# Patient Record
Sex: Male | Born: 1950
Health system: Southern US, Community
[De-identification: ages and names within clinical notes are randomized; demographics above are authoritative.]

## PROBLEM LIST (undated history)

## (undated) DIAGNOSIS — F40240 Claustrophobia: Secondary | ICD-10-CM

## (undated) DIAGNOSIS — M1990 Unspecified osteoarthritis, unspecified site: Secondary | ICD-10-CM

## (undated) DIAGNOSIS — E119 Type 2 diabetes mellitus without complications: Secondary | ICD-10-CM

## (undated) DIAGNOSIS — J189 Pneumonia, unspecified organism: Secondary | ICD-10-CM

## (undated) DIAGNOSIS — G4730 Sleep apnea, unspecified: Secondary | ICD-10-CM

## (undated) DIAGNOSIS — I10 Essential (primary) hypertension: Secondary | ICD-10-CM

## (undated) DIAGNOSIS — K219 Gastro-esophageal reflux disease without esophagitis: Secondary | ICD-10-CM

## (undated) DIAGNOSIS — E785 Hyperlipidemia, unspecified: Secondary | ICD-10-CM

## (undated) DIAGNOSIS — N189 Chronic kidney disease, unspecified: Secondary | ICD-10-CM

## (undated) DIAGNOSIS — C801 Malignant (primary) neoplasm, unspecified: Secondary | ICD-10-CM

## (undated) DIAGNOSIS — D649 Anemia, unspecified: Secondary | ICD-10-CM

## (undated) DIAGNOSIS — J45909 Unspecified asthma, uncomplicated: Secondary | ICD-10-CM

## (undated) HISTORY — PX: NASAL SINUS SURGERY: SHX719

## (undated) HISTORY — DX: Type 2 diabetes mellitus without complications: E11.9

## (undated) HISTORY — PX: EYE SURGERY: SHX253

## (undated) HISTORY — PX: CARDIAC CATHETERIZATION: SHX172

## (undated) HISTORY — DX: Essential (primary) hypertension: I10

## (undated) HISTORY — PX: SKIN LESION EXCISION: SHX2412

## (undated) HISTORY — PX: CERVICAL VERTEBRAE EXCISION: SUR502

## (undated) HISTORY — PX: OTHER SURGICAL HISTORY: SHX169

## (undated) HISTORY — PX: TRIGGER FINGER RELEASE: SHX641

## (undated) HISTORY — PX: CHOLECYSTECTOMY: SHX55

## (undated) HISTORY — DX: Hyperlipidemia, unspecified: E78.5

---

## 2007-12-29 DIAGNOSIS — C4492 Squamous cell carcinoma of skin, unspecified: Secondary | ICD-10-CM

## 2007-12-29 HISTORY — DX: Squamous cell carcinoma of skin, unspecified: C44.92

## 2014-03-28 LAB — HEMOGLOBIN A1C: HEMOGLOBIN A1C: 10.2

## 2015-01-25 DIAGNOSIS — I10 Essential (primary) hypertension: Secondary | ICD-10-CM | POA: Diagnosis not present

## 2015-01-25 DIAGNOSIS — E785 Hyperlipidemia, unspecified: Secondary | ICD-10-CM | POA: Diagnosis not present

## 2015-01-25 DIAGNOSIS — E1165 Type 2 diabetes mellitus with hyperglycemia: Secondary | ICD-10-CM | POA: Diagnosis not present

## 2015-01-25 DIAGNOSIS — J4540 Moderate persistent asthma, uncomplicated: Secondary | ICD-10-CM | POA: Diagnosis not present

## 2015-01-25 DIAGNOSIS — F410 Panic disorder [episodic paroxysmal anxiety] without agoraphobia: Secondary | ICD-10-CM | POA: Diagnosis not present

## 2015-01-25 DIAGNOSIS — Z794 Long term (current) use of insulin: Secondary | ICD-10-CM | POA: Diagnosis not present

## 2015-01-25 DIAGNOSIS — N183 Chronic kidney disease, stage 3 (moderate): Secondary | ICD-10-CM | POA: Diagnosis not present

## 2015-01-25 DIAGNOSIS — E1121 Type 2 diabetes mellitus with diabetic nephropathy: Secondary | ICD-10-CM | POA: Diagnosis not present

## 2015-01-25 DIAGNOSIS — Z889 Allergy status to unspecified drugs, medicaments and biological substances status: Secondary | ICD-10-CM | POA: Diagnosis not present

## 2015-02-07 DIAGNOSIS — J309 Allergic rhinitis, unspecified: Secondary | ICD-10-CM | POA: Diagnosis not present

## 2015-02-07 DIAGNOSIS — K219 Gastro-esophageal reflux disease without esophagitis: Secondary | ICD-10-CM | POA: Diagnosis not present

## 2015-02-07 DIAGNOSIS — J45909 Unspecified asthma, uncomplicated: Secondary | ICD-10-CM | POA: Diagnosis not present

## 2015-02-07 DIAGNOSIS — R05 Cough: Secondary | ICD-10-CM | POA: Diagnosis not present

## 2015-02-08 DIAGNOSIS — R0602 Shortness of breath: Secondary | ICD-10-CM | POA: Diagnosis not present

## 2015-02-22 DIAGNOSIS — I10 Essential (primary) hypertension: Secondary | ICD-10-CM | POA: Diagnosis not present

## 2015-02-22 DIAGNOSIS — I83819 Varicose veins of unspecified lower extremities with pain: Secondary | ICD-10-CM | POA: Diagnosis not present

## 2015-02-28 DIAGNOSIS — J4540 Moderate persistent asthma, uncomplicated: Secondary | ICD-10-CM | POA: Diagnosis not present

## 2015-02-28 DIAGNOSIS — K219 Gastro-esophageal reflux disease without esophagitis: Secondary | ICD-10-CM | POA: Diagnosis not present

## 2015-02-28 DIAGNOSIS — R0902 Hypoxemia: Secondary | ICD-10-CM | POA: Diagnosis not present

## 2015-02-28 DIAGNOSIS — R05 Cough: Secondary | ICD-10-CM | POA: Diagnosis not present

## 2015-02-28 DIAGNOSIS — G4733 Obstructive sleep apnea (adult) (pediatric): Secondary | ICD-10-CM | POA: Diagnosis not present

## 2015-02-28 DIAGNOSIS — J3089 Other allergic rhinitis: Secondary | ICD-10-CM | POA: Diagnosis not present

## 2015-03-21 DIAGNOSIS — R05 Cough: Secondary | ICD-10-CM | POA: Diagnosis not present

## 2015-03-21 DIAGNOSIS — J4540 Moderate persistent asthma, uncomplicated: Secondary | ICD-10-CM | POA: Diagnosis not present

## 2015-03-21 DIAGNOSIS — H9212 Otorrhea, left ear: Secondary | ICD-10-CM | POA: Diagnosis not present

## 2015-03-21 DIAGNOSIS — H7292 Unspecified perforation of tympanic membrane, left ear: Secondary | ICD-10-CM | POA: Diagnosis not present

## 2015-03-21 DIAGNOSIS — H6692 Otitis media, unspecified, left ear: Secondary | ICD-10-CM | POA: Diagnosis not present

## 2015-03-29 DIAGNOSIS — M65342 Trigger finger, left ring finger: Secondary | ICD-10-CM | POA: Diagnosis not present

## 2015-03-31 ENCOUNTER — Ambulatory Visit: Payer: Self-pay | Admitting: "Endocrinology

## 2015-04-04 DIAGNOSIS — J4540 Moderate persistent asthma, uncomplicated: Secondary | ICD-10-CM | POA: Diagnosis not present

## 2015-04-04 DIAGNOSIS — R05 Cough: Secondary | ICD-10-CM | POA: Diagnosis not present

## 2015-04-08 ENCOUNTER — Ambulatory Visit: Payer: Self-pay | Admitting: "Endocrinology

## 2015-04-08 DIAGNOSIS — H40023 Open angle with borderline findings, high risk, bilateral: Secondary | ICD-10-CM | POA: Diagnosis not present

## 2015-04-19 DIAGNOSIS — J4540 Moderate persistent asthma, uncomplicated: Secondary | ICD-10-CM | POA: Diagnosis not present

## 2015-04-19 DIAGNOSIS — R05 Cough: Secondary | ICD-10-CM | POA: Diagnosis not present

## 2015-04-21 DIAGNOSIS — G2581 Restless legs syndrome: Secondary | ICD-10-CM | POA: Diagnosis not present

## 2015-04-21 DIAGNOSIS — I83899 Varicose veins of unspecified lower extremities with other complications: Secondary | ICD-10-CM | POA: Diagnosis not present

## 2015-04-21 DIAGNOSIS — I872 Venous insufficiency (chronic) (peripheral): Secondary | ICD-10-CM | POA: Diagnosis not present

## 2015-04-22 ENCOUNTER — Other Ambulatory Visit: Payer: Self-pay | Admitting: "Endocrinology

## 2015-04-22 ENCOUNTER — Ambulatory Visit (INDEPENDENT_AMBULATORY_CARE_PROVIDER_SITE_OTHER): Payer: Medicare Other | Admitting: "Endocrinology

## 2015-04-22 ENCOUNTER — Encounter: Payer: Self-pay | Admitting: "Endocrinology

## 2015-04-22 ENCOUNTER — Encounter: Payer: Medicare Other | Attending: "Endocrinology | Admitting: Nutrition

## 2015-04-22 VITALS — Ht 71.0 in | Wt 256.0 lb

## 2015-04-22 VITALS — BP 137/82 | HR 80 | Ht 71.0 in | Wt 256.0 lb

## 2015-04-22 DIAGNOSIS — E669 Obesity, unspecified: Secondary | ICD-10-CM | POA: Insufficient documentation

## 2015-04-22 DIAGNOSIS — E119 Type 2 diabetes mellitus without complications: Secondary | ICD-10-CM | POA: Insufficient documentation

## 2015-04-22 DIAGNOSIS — E785 Hyperlipidemia, unspecified: Secondary | ICD-10-CM | POA: Diagnosis not present

## 2015-04-22 DIAGNOSIS — E1165 Type 2 diabetes mellitus with hyperglycemia: Secondary | ICD-10-CM | POA: Diagnosis not present

## 2015-04-22 DIAGNOSIS — E1122 Type 2 diabetes mellitus with diabetic chronic kidney disease: Secondary | ICD-10-CM | POA: Diagnosis not present

## 2015-04-22 DIAGNOSIS — Z794 Long term (current) use of insulin: Secondary | ICD-10-CM | POA: Insufficient documentation

## 2015-04-22 DIAGNOSIS — E118 Type 2 diabetes mellitus with unspecified complications: Secondary | ICD-10-CM

## 2015-04-22 DIAGNOSIS — IMO0002 Reserved for concepts with insufficient information to code with codable children: Secondary | ICD-10-CM

## 2015-04-22 DIAGNOSIS — N183 Chronic kidney disease, stage 3 (moderate): Secondary | ICD-10-CM | POA: Insufficient documentation

## 2015-04-22 DIAGNOSIS — I10 Essential (primary) hypertension: Secondary | ICD-10-CM

## 2015-04-22 DIAGNOSIS — E782 Mixed hyperlipidemia: Secondary | ICD-10-CM | POA: Insufficient documentation

## 2015-04-22 MED ORDER — SITAGLIPTIN PHOSPHATE 25 MG PO TABS: 25.0000 mg | ORAL_TABLET | Freq: Every day | ORAL | Status: DC

## 2015-04-22 NOTE — Patient Instructions (Signed)
Goals: 1. Follow My Plate Method 2. Eat 3-4 carb choices per meal 3. Do not skip meals 4. Eat meals on time 5. Take insulin as prescribed. Use sliding scale as instructed. 6. Cut out snacks between meals. 7. Drink only water 8. Walk for exercise 30 minutes at least daily for needed weight loss 9. Get A1C down to 8-9% in three months

## 2015-04-22 NOTE — Progress Notes (Signed)
Subjective:    Patient ID: Allen [name], male    DOB: 1950/05/01. Patient is being seen in consultation for management of diabetes requested by  [facility], MD  Past Medical History  Diagnosis Date  . Diabetes mellitus, type II ([place])   . Hypertension   . Hyperlipidemia   . Kidney disease    Past Surgical History  Procedure Laterality Date  . Nasal sinus surgery    . Cholecystectomy    . Carpal tunnel     Social History   Social History  . Marital Status: Married    Spouse Name: N/A  . Number of Children: N/A  . Years of Education: N/A   Social History Main Topics  . Smoking status: Never Smoker   . Smokeless tobacco: None  . Alcohol Use: No  . Drug Use: No  . Sexual Activity: Not Asked   Other Topics Concern  . None   Social History Narrative  . None   Outpatient Encounter Prescriptions as of 04/22/2015  Medication Sig  . amLODipine (NORVASC) 10 MG tablet Take 10 mg by mouth daily.  Marland Kitchen atorvastatin (LIPITOR) 20 MG tablet Take 20 mg by mouth daily.  Marland Kitchen eplerenone (INSPRA) 50 MG tablet Take 50 mg by mouth daily.  . fenofibrate (TRICOR) 145 MG tablet Take 145 mg by mouth daily.  . Fluticasone-Salmeterol (ADVAIR DISKUS) 500-50 MCG/DOSE AEPB Inhale 1 puff into the lungs daily.  Marland Kitchen gabapentin (NEURONTIN) 300 MG capsule Take 300 mg by mouth at bedtime.  . insulin aspart (NOVOLOG FLEXPEN) 100 UNIT/ML FlexPen Inject 10-16 Units into the skin 3 (three) times daily with meals.  . Insulin Glargine (LANTUS SOLOSTAR) 100 UNIT/ML Solostar Pen Inject 50 Units into the skin daily at 10 pm.  . losartan (COZAAR) 100 MG tablet Take 100 mg by mouth daily.  . metoprolol (LOPRESSOR) 50 MG tablet Take 50 mg by mouth every morning.  . mometasone (NASONEX) 50 MCG/ACT nasal spray Place 2 sprays into the nose daily.  . montelukast (SINGULAIR) 10 MG tablet Take 10 mg by mouth at bedtime.  . Omega-3 Fatty Acids (FISH OIL) 1000 MG CAPS Take by mouth 2 (two) times daily.  . sitaGLIPtin  (JANUVIA) 25 MG tablet Take 1 tablet (25 mg total) by mouth daily.   No facility-administered encounter medications on file as of 04/22/2015.   ALLERGIES: No Known Allergies VACCINATION STATUS:  There is no immunization history on file for this patient.  Diabetes He presents for his initial diabetic visit. He has type 2 diabetes mellitus. Onset time: He was diagnosed at approximate age of 22 years. His disease course has been worsening. There are no hypoglycemic associated symptoms. Pertinent negatives for hypoglycemia include no confusion, headaches, pallor or seizures. Associated symptoms include polydipsia, polyphagia and polyuria. Pertinent negatives for diabetes include no chest pain, no fatigue and no weakness. There are no hypoglycemic complications. Symptoms are worsening. Diabetic complications include nephropathy. Risk factors for coronary artery disease include diabetes mellitus, dyslipidemia, hypertension, male sex, sedentary lifestyle and obesity. Current diabetic treatment includes insulin injections (He is on Lantus 78 units nightly and NovoLog 30-40 units 3 times a day before meals.). His weight is decreasing steadily. He is following a generally unhealthy diet. When asked about meal planning, he reported none. He has not had a previous visit with a dietitian (He will see the dietitian today.). He participates in exercise intermittently (He participates in golfing activities.). Home blood sugar record trend: He did not bring any meter nor blood  glucose results to review today. An ACE inhibitor/angiotensin II receptor blocker is being taken. Eye exam is current.  Hyperlipidemia This is a chronic problem. The current episode started more than 1 year ago. The problem is uncontrolled. Exacerbating diseases include chronic renal disease, diabetes and obesity. Pertinent negatives include no chest pain, myalgias or shortness of breath. Current antihyperlipidemic treatment includes statins and  bile acid squestrants. Compliance problems include adherence to diet.  Risk factors for coronary artery disease include diabetes mellitus, dyslipidemia, hypertension, male sex, obesity and a sedentary lifestyle.  Hypertension This is a chronic problem. The current episode started more than 1 year ago. The problem is uncontrolled. Pertinent negatives include no chest pain, headaches, neck pain, palpitations or shortness of breath. Risk factors for coronary artery disease include diabetes mellitus. Past treatments include angiotensin blockers. Hypertensive end-organ damage includes kidney disease. Identifiable causes of hypertension include chronic renal disease.     Review of Systems  Constitutional: Negative for fever, chills, fatigue and unexpected weight change.  HENT: Negative for dental problem, mouth sores and trouble swallowing.   Eyes: Negative for visual disturbance.  Respiratory: Negative for cough, choking, chest tightness, shortness of breath and wheezing.   Cardiovascular: Negative for chest pain, palpitations and leg swelling.  Gastrointestinal: Negative for nausea, vomiting, abdominal pain, diarrhea, constipation and abdominal distention.  Endocrine: Positive for polydipsia, polyphagia and polyuria.  Genitourinary: Negative for dysuria, urgency, hematuria and flank pain.  Musculoskeletal: Negative for myalgias, back pain, gait problem and neck pain.  Skin: Negative for pallor, rash and wound.  Neurological: Negative for seizures, syncope, weakness, numbness and headaches.  Psychiatric/Behavioral: Negative.  Negative for confusion and dysphoric mood.    Objective:    BP 137/82 mmHg  Pulse 80  Ht 5\' 11"  (1.803 m)  Wt 256 lb (116.121 kg)  BMI 35.72 kg/m2  SpO2 95%  Wt Readings from Last 3 Encounters:  04/22/15 256 lb (116.121 kg)    Physical Exam  Constitutional: He is oriented to person, place, and time. He appears well-developed and well-nourished. He is cooperative. No  distress.  HENT:  Head: Normocephalic and atraumatic.  Eyes: EOM are normal.  Neck: Normal range of motion. Neck supple. No tracheal deviation present. No thyromegaly present.  Cardiovascular: Normal rate, S1 normal, S2 normal and normal heart sounds.  Exam reveals no gallop.   No murmur heard. Pulses:      Dorsalis pedis pulses are 1+ on the right side, and 1+ on the left side.       Posterior tibial pulses are 1+ on the right side, and 1+ on the left side.  Pulmonary/Chest: Breath sounds normal. No respiratory distress. He has no wheezes.  Abdominal: Soft. Bowel sounds are normal. He exhibits no distension. There is no tenderness. There is no guarding and no CVA tenderness.  Musculoskeletal: He exhibits no edema.       Right shoulder: He exhibits no swelling and no deformity.  Neurological: He is alert and oriented to person, place, and time. He has normal strength and normal reflexes. No cranial nerve deficit or sensory deficit. Gait normal.  Skin: Skin is warm and dry. No rash noted. No cyanosis. Nails show no clubbing.  Psychiatric: He has a normal mood and affect. His speech is normal and behavior is normal. Judgment and thought content normal. Cognition and memory are normal.      Assessment & Plan:   1. Uncontrolled type 2 diabetes mellitus with stage 3 chronic kidney disease, with long-term current  use of insulin ([place]) - Patient has currently uncontrolled symptomatic type 2 DM since  65 years of age,  with last A1c of 10.2% from December 2016. He does not have recent lab work to review.   his diabetes is complicated by CK D  and patient remains at a high risk for more acute and chronic complications of diabetes which include CAD, CVA, CKD, retinopathy, and neuropathy. These are all discussed in detail with the patient.  - I have counseled the patient on diet management and weight loss, by adopting a carbohydrate restricted/protein rich diet.  - Suggestion is made for patient to  avoid simple carbohydrates   from their diet including Cakes , Desserts, Ice Cream,  Soda (  diet and regular) , Sweet Tea , Candies,  Chips, Cookies, Artificial Sweeteners,   and "Sugar-free" Products . This will help patient to have stable blood glucose profile and potentially avoid unintended weight gain.  - I encouraged the patient to switch to  unprocessed or minimally processed complex starch and increased protein intake (animal or plant source), fruits, and vegetables.  - Patient is advised to stick to a routine mealtimes to eat 3 meals  a day and avoid unnecessary snacks ( to snack only to correct hypoglycemia).  - The patient will be scheduled with Jearld Fenton, RDN, CDE for individualized DM education.  - I have approached patient with the following individualized plan to manage diabetes and patient agrees:   - I  will proceed to readjust his  basal insulin Lantus to 50  units QHS, and prandial insulin NovoLog to 10  units TIDAC for pre-meal BG readings of 90-150mg /dl, plus patient specific correction dose for unexpected hyperglycemia above 150mg /dl, associated with strict monitoring of glucose  AC and HS. - Patient is warned not to take insulin without proper monitoring per orders. -Adjustment parameters are given for hypo and hyperglycemia in writing. -Patient is encouraged to call clinic for blood glucose levels less than 70 or above 300 mg /dl. -I will add low-dose Januvia 25 mg by mouth daily. -His GFR is 47, he may requalify for low-dose metformin if he improves renal function. -Reportedly, he did not tolerate Trulicity .  - Patient specific target  A1c;  LDL, HDL, Triglycerides, and  Waist Circumference were discussed in detail.  2) BP/HTN: Controlled. Continue current medications including ACEI/ARB. 3) Lipids/HPL:  Uncontrolled, triglycerides 267 LDL is 74  continue statins, TriCor and , omega-3 fatty acids.  4)  Weight/Diet: CDE Consult will be initiated , exercise, and  detailed carbohydrates information provided.  5) Chronic Care/Health Maintenance:  -Patient is on ACEI/ARB and Statin medications and encouraged to continue to follow up with Ophthalmology, Podiatrist at least yearly or according to recommendations, and advised to   stay away from smoking. I have recommended yearly flu vaccine and pneumonia vaccination at least every 5 years; moderate intensity exercise for up to 150 minutes weekly; and  sleep for at least 7 hours a day.  - 60 minutes of time was spent on the care of this patient , 50% of which was applied for counseling on diabetes complications and their preventions.  - Patient to bring meter and  blood glucose logs during their next visit. - He will be sent to get a new set of labs before his next visit.  - I advised patient to maintain close follow up with [facility], MD for primary care needs.  Follow up plan: - Return in about 1 week (around 04/29/2015)  for diabetes, high blood pressure, high cholesterol, labs today.  Glade Lloyd, MD Phone: [phone]  Fax: [phone]   04/22/2015, 4:52 PM

## 2015-04-22 NOTE — Patient Instructions (Signed)

## 2015-04-22 NOTE — Progress Notes (Signed)
  Medical Nutrition Therapy:  Appt start time: 1430 end time:  1500.   Assessment:  Primary concerns today: Diabetes Type 2. Here with his wife. A1C 10.2%. He is on 50 units of Lantus and 10 units of Humalog with meals plus sliding scale. Saw Dr. Dorris Fetch today. Walk in visit today. Will answer security questions next visit. Eats 2-3 meals per day. Plays golf. Meals sometimes inconsistent.  Diet is excessive in calories, carbs, and low in fresh fruits and vegetables.   Lab Results  Component Value Date   HGBA1C 10.2 03/28/2014       Preferred Learning Style:    Auditory  Visual  Hands on  No preference indicated    Learning Readiness:     Ready  Change in progress   MEDICATIONS: See   DIETARY INTAKE:  Eats 2-3 meals per day. Snacks some. Eats late at dinner or lunch and may eat larger portions. Drinks water mostly.  Usual physical activity: Golf  Estimated energy needs: 1800 calories 200 g carbohydrates 135 g protein 50 g fat  Progress Towards Goal(s):  In progress.   Nutritional Diagnosis:  NB-1.1 Food and nutrition-related knowledge deficit As related to Diabetes.  As evidenced by A1C 10.1%.    Intervention:  Nutrition and Diabetes education provided on My Plate, CHO counting, meal planning, portion sizes, timing of meals, avoiding snacks between meals unless having a low blood sugar, target ranges for A1C and blood sugars, signs/symptoms and treatment of hyper/hypoglycemia, monitoring blood sugars, taking medications as prescribed, benefits of exercising 30 minutes per day and prevention of complications of DM.   Goals: 1. Follow My Plate Method 2. Eat 3-4 carb choices per meal 3. Do not skip meals 4. Eat meals on time 5. Take insulin as prescribed. Use sliding scale as instructed. 6. Cut out snacks between meals. 7. Drink only water 8. Walk for exercise 30 minutes at least daily for needed weight loss 9. Get A1C down to 8-9% in three months   Teaching  Method Utilized:  Visual Auditory Hands on  Handouts given during visit include:  The Plate Method  Meal Plan Card  Diabetes Instructions  Barriers to learning/adherence to lifestyle change:  None  Demonstrated degree of understanding via:  Teach Back   Monitoring/Evaluation:  Dietary intake, exercise, meal planning, SBG, and body weight in 1  month(s).

## 2015-04-23 LAB — CMP14+EGFR
A/G RATIO: 1.5 (ref 1.2–2.2)
ALT: 16 IU/L (ref 0–44)
AST: 22 IU/L (ref 0–40)
Albumin: 4.3 g/dL (ref 3.6–4.8)
Alkaline Phosphatase: 71 IU/L (ref 39–117)
BUN/Creatinine Ratio: 15 (ref 10–22)
BUN: 23 mg/dL (ref 8–27)
Bilirubin Total: 0.4 mg/dL (ref 0.0–1.2)
CO2: 25 mmol/L (ref 18–29)
Calcium: 9.6 mg/dL (ref 8.6–10.2)
Chloride: 98 mmol/L (ref 96–106)
Creatinine, Ser: 1.51 mg/dL — ABNORMAL HIGH (ref 0.76–1.27)
GFR calc non Af Amer: 48 mL/min/{1.73_m2} — ABNORMAL LOW (ref >59)
GFR, EST AFRICAN AMERICAN: 55 mL/min/{1.73_m2} — AB (ref >59)
GLUCOSE: 229 mg/dL — AB (ref 65–99)
Globulin, Total: 2.8 g/dL (ref 1.5–4.5)
POTASSIUM: 4.7 mmol/L (ref 3.5–5.2)
Sodium: 141 mmol/L (ref 134–144)
TOTAL PROTEIN: 7.1 g/dL (ref 6.0–8.5)

## 2015-04-23 LAB — T4, FREE: Free T4: 1.04 ng/dL (ref 0.82–1.77)

## 2015-04-23 LAB — HGB A1C W/O EAG: HEMOGLOBIN A1C: 10.4 % — AB (ref 4.8–5.6)

## 2015-04-23 LAB — TSH: TSH: 1.41 u[IU]/mL (ref 0.450–4.500)

## 2015-05-02 ENCOUNTER — Encounter: Payer: Self-pay | Admitting: "Endocrinology

## 2015-05-02 ENCOUNTER — Ambulatory Visit (INDEPENDENT_AMBULATORY_CARE_PROVIDER_SITE_OTHER): Payer: Medicare Other | Admitting: "Endocrinology

## 2015-05-02 VITALS — BP 130/83 | HR 67 | Ht 71.0 in | Wt 252.0 lb

## 2015-05-02 DIAGNOSIS — IMO0002 Reserved for concepts with insufficient information to code with codable children: Secondary | ICD-10-CM

## 2015-05-02 DIAGNOSIS — Z794 Long term (current) use of insulin: Secondary | ICD-10-CM | POA: Diagnosis not present

## 2015-05-02 DIAGNOSIS — R05 Cough: Secondary | ICD-10-CM | POA: Diagnosis not present

## 2015-05-02 DIAGNOSIS — E1122 Type 2 diabetes mellitus with diabetic chronic kidney disease: Secondary | ICD-10-CM | POA: Diagnosis not present

## 2015-05-02 DIAGNOSIS — J4540 Moderate persistent asthma, uncomplicated: Secondary | ICD-10-CM | POA: Diagnosis not present

## 2015-05-02 DIAGNOSIS — N183 Chronic kidney disease, stage 3 (moderate): Secondary | ICD-10-CM | POA: Diagnosis not present

## 2015-05-02 DIAGNOSIS — E785 Hyperlipidemia, unspecified: Secondary | ICD-10-CM | POA: Diagnosis not present

## 2015-05-02 DIAGNOSIS — E1165 Type 2 diabetes mellitus with hyperglycemia: Secondary | ICD-10-CM

## 2015-05-02 DIAGNOSIS — I10 Essential (primary) hypertension: Secondary | ICD-10-CM | POA: Diagnosis not present

## 2015-05-02 MED ORDER — SITAGLIPTIN PHOSPHATE 25 MG PO TABS
25.0000 mg | ORAL_TABLET | Freq: Every day | ORAL | Status: DC
Start: 2015-05-02 — End: 2015-10-31

## 2015-05-02 MED ORDER — METFORMIN HCL 500 MG PO TABS: 500.0000 mg | ORAL_TABLET | Freq: Two times a day (BID) | ORAL | Status: DC

## 2015-05-02 NOTE — Progress Notes (Signed)
Subjective:    Patient ID: Allen [name], male    DOB: 11-Aug-1950. Patient is being seen in consultation for management of diabetes requested by  [facility], [place]  Past Medical History  Diagnosis Date  . Diabetes mellitus, type II ([place])   . Hypertension   . Hyperlipidemia   . Kidney disease    Past Surgical History  Procedure Laterality Date  . Nasal sinus surgery    . Cholecystectomy    . Carpal tunnel     Social History   Social History  . Marital Status: Married    Spouse Name: N/A  . Number of Children: N/A  . Years of Education: N/A   Social History Main Topics  . Smoking status: Never Smoker   . Smokeless tobacco: None  . Alcohol Use: No  . Drug Use: No  . Sexual Activity: Not Asked   Other Topics Concern  . None   Social History Narrative   Outpatient Encounter Prescriptions as of 05/02/2015  Medication Sig  . amLODipine (NORVASC) 10 MG tablet Take 10 mg by mouth daily.  Marland Kitchen atorvastatin (LIPITOR) 20 MG tablet Take 20 mg by mouth daily.  Marland Kitchen eplerenone (INSPRA) 50 MG tablet Take 50 mg by mouth daily.  . fenofibrate (TRICOR) 145 MG tablet Take 145 mg by mouth daily.  . Fluticasone-Salmeterol (ADVAIR DISKUS) 500-50 MCG/DOSE AEPB Inhale 1 puff into the lungs daily.  Marland Kitchen gabapentin (NEURONTIN) 300 MG capsule Take 300 mg by mouth at bedtime.  . Insulin Glargine (LANTUS SOLOSTAR) 100 UNIT/ML Solostar Pen Inject 50 Units into the skin daily at 10 pm.  . losartan (COZAAR) 100 MG tablet Take 100 mg by mouth daily.  . metFORMIN (GLUCOPHAGE) 500 MG tablet Take 1 tablet (500 mg total) by mouth 2 (two) times daily with a meal.  . metoprolol (LOPRESSOR) 50 MG tablet Take 50 mg by mouth every morning.  . mometasone (NASONEX) 50 MCG/ACT nasal spray Place 2 sprays into the nose daily.  . montelukast (SINGULAIR) 10 MG tablet Take 10 mg by mouth at bedtime.  . Omega-3 Fatty Acids (FISH OIL) 1000 MG CAPS Take by mouth 2 (two) times daily.  . sitaGLIPtin (JANUVIA) 25 MG tablet  Take 1 tablet (25 mg total) by mouth daily.  . [DISCONTINUED] insulin aspart (NOVOLOG FLEXPEN) 100 UNIT/ML FlexPen Inject 10-16 Units into the skin 3 (three) times daily with meals.  . [DISCONTINUED] sitaGLIPtin (JANUVIA) 25 MG tablet Take 1 tablet (25 mg total) by mouth daily.   No facility-administered encounter medications on file as of 05/02/2015.   ALLERGIES: No Known Allergies VACCINATION STATUS:  There is no immunization history on file for this patient.  Diabetes He presents for his follow-up diabetic visit. He has type 2 diabetes mellitus. Onset time: He was diagnosed at approximate age of 36 years. His disease course has been improving. There are no hypoglycemic associated symptoms. Pertinent negatives for hypoglycemia include no confusion, headaches, pallor or seizures. Pertinent negatives for diabetes include no chest pain, no fatigue, no polydipsia, no polyphagia, no polyuria and no weakness. There are no hypoglycemic complications. Symptoms are improving. Diabetic complications include nephropathy. Risk factors for coronary artery disease include diabetes mellitus, dyslipidemia, hypertension, male sex, sedentary lifestyle and obesity. Current diabetic treatment includes insulin injections (He is on Lantus 78 units nightly and NovoLog 30-40 units 3 times a day before meals.). His weight is decreasing steadily. He is following a generally unhealthy diet. When asked about meal planning, he reported none. He has not  had a previous visit with a dietitian. He participates in exercise intermittently (He participates in golfing activities.). His breakfast blood glucose range is generally 140-180 mg/dl. His lunch blood glucose range is generally 130-140 mg/dl. His dinner blood glucose range is generally 140-180 mg/dl. His overall blood glucose range is 140-180 mg/dl. An ACE inhibitor/angiotensin II receptor blocker is being taken. Eye exam is current.  Hyperlipidemia This is a chronic problem. The  current episode started more than 1 year ago. The problem is uncontrolled. Exacerbating diseases include chronic renal disease, diabetes and obesity. Pertinent negatives include no chest pain, myalgias or shortness of breath. Current antihyperlipidemic treatment includes statins and bile acid squestrants. Compliance problems include adherence to diet.  Risk factors for coronary artery disease include diabetes mellitus, dyslipidemia, hypertension, male sex, obesity and a sedentary lifestyle.  Hypertension This is a chronic problem. The current episode started more than 1 year ago. The problem is uncontrolled. Pertinent negatives include no chest pain, headaches, neck pain, palpitations or shortness of breath. Risk factors for coronary artery disease include diabetes mellitus. Past treatments include angiotensin blockers. Hypertensive end-organ damage includes kidney disease. Identifiable causes of hypertension include chronic renal disease.     Review of Systems  Constitutional: Negative for fever, chills, fatigue and unexpected weight change.  HENT: Negative for dental problem, mouth sores and trouble swallowing.   Eyes: Negative for visual disturbance.  Respiratory: Negative for cough, choking, chest tightness, shortness of breath and wheezing.   Cardiovascular: Negative for chest pain, palpitations and leg swelling.  Gastrointestinal: Negative for nausea, vomiting, abdominal pain, diarrhea, constipation and abdominal distention.  Endocrine: Negative for polydipsia, polyphagia and polyuria.  Genitourinary: Negative for dysuria, urgency, hematuria and flank pain.  Musculoskeletal: Negative for myalgias, back pain, gait problem and neck pain.  Skin: Negative for pallor, rash and wound.  Neurological: Negative for seizures, syncope, weakness, numbness and headaches.  Psychiatric/Behavioral: Negative.  Negative for confusion and dysphoric mood.    Objective:    BP 130/83 mmHg  Pulse 67  Ht 5'  11" (1.803 m)  Wt 252 lb (114.306 kg)  BMI 35.16 kg/m2  SpO2 95%  Wt Readings from Last 3 Encounters:  05/02/15 252 lb (114.306 kg)  04/22/15 256 lb (116.121 kg)  04/22/15 256 lb (116.121 kg)    Physical Exam  Constitutional: He is oriented to person, place, and time. He appears well-developed and well-nourished. He is cooperative. No distress.  HENT:  Head: Normocephalic and atraumatic.  Eyes: EOM are normal.  Neck: Normal range of motion. Neck supple. No tracheal deviation present. No thyromegaly present.  Cardiovascular: Normal rate, S1 normal, S2 normal and normal heart sounds.  Exam reveals no gallop.   No murmur heard. Pulses:      Dorsalis pedis pulses are 1+ on the right side, and 1+ on the left side.       Posterior tibial pulses are 1+ on the right side, and 1+ on the left side.  Pulmonary/Chest: Breath sounds normal. No respiratory distress. He has no wheezes.  Abdominal: Soft. Bowel sounds are normal. He exhibits no distension. There is no tenderness. There is no guarding and no CVA tenderness.  Musculoskeletal: He exhibits no edema.       Right shoulder: He exhibits no swelling and no deformity.  Neurological: He is alert and oriented to person, place, and time. He has normal strength and normal reflexes. No cranial nerve deficit or sensory deficit. Gait normal.  Skin: Skin is warm and dry. No rash  noted. No cyanosis. Nails show no clubbing.  Psychiatric: He has a normal mood and affect. His speech is normal and behavior is normal. Judgment and thought content normal. Cognition and memory are normal.    Assessment & Plan:   1. Uncontrolled type 2 diabetes mellitus with stage 3 chronic kidney disease, with long-term current use of insulin ([place]) - Patient has currently uncontrolled symptomatic type 2 DM since  65 years of age,  with last A1c of 10.4%.   his diabetes is complicated by mild  CKD  and patient remains at a high risk for more acute and chronic complications of  diabetes which include CAD, CVA, CKD, retinopathy, and neuropathy. These are all discussed in detail with the patient.  - I have counseled the patient on diet management and weight loss, by adopting a carbohydrate restricted/protein rich diet.  - Suggestion is made for patient to avoid simple carbohydrates   from their diet including Cakes , Desserts, Ice Cream,  Soda (  diet and regular) , Sweet Tea , Candies,  Chips, Cookies, Artificial Sweeteners,   and "Sugar-free" Products . This will help patient to have stable blood glucose profile and potentially avoid unintended weight gain.  - I encouraged the patient to switch to  unprocessed or minimally processed complex starch and increased protein intake (animal or plant source), fruits, and vegetables.  - Patient is advised to stick to a routine mealtimes to eat 3 meals  a day and avoid unnecessary snacks ( to snack only to correct hypoglycemia).  - The patient will be scheduled with Jearld Fenton, RDN, CDE for individualized DM education.  - I have approached patient with the following individualized plan to manage diabetes and patient agrees:   - He came with traumatic improvement in his  blood glucose profile with average of 149 in the last 7 days (n= 25).  - I  Will proceed only with basal insulin lantus  50  units QHS, associated with strict monitoring of glucose  AC breakfast and at bedtime. -Given his commitment and proper engagement, he can avoid prandial insulin for now, hence I'm stopping his NovoLog . -Patient is encouraged to call clinic for blood glucose levels less than 70 or above 200 mg /dl. -I will add low-dose Januvia 25 mg by mouth daily, his insurance would not pay, he will be given his correction for Tradjenta.  -His GFR is 47, I will add low-dose metformin 500 mg by mouth twice a day.  -Reportedly, he did not tolerate Trulicity .  - Patient specific target  A1c;  LDL, HDL, Triglycerides, and  Waist Circumference were  discussed in detail.  2) BP/HTN: Controlled. Continue current medications including ACEI/ARB. 3) Lipids/HPL:  Uncontrolled, triglycerides 267 LDL is 74  continue statins, TriCor and , omega-3 fatty acids.  4)  Weight/Diet: CDE Consult will be initiated , exercise, and detailed carbohydrates information provided.  5) Chronic Care/Health Maintenance:  -Patient is on ACEI/ARB and Statin medications and encouraged to continue to follow up with Ophthalmology, Podiatrist at least yearly or according to recommendations, and advised to   stay away from smoking. I have recommended yearly flu vaccine and pneumonia vaccination at least every 5 years; moderate intensity exercise for up to 150 minutes weekly; and  sleep for at least 7 hours a day.  - 25 minutes of time was spent on the care of this patient , 50% of which was applied for counseling on diabetes complications and their preventions. - I advised  patient to maintain close follow up with [facility], MD for primary care needs.  Follow up plan: - Return in about 3 months (around 08/01/2015) for diabetes, high blood pressure, high cholesterol, meter, and logs.  Glade Lloyd, MD Phone: [phone]  Fax: [phone]   05/02/2015, 4:36 PM

## 2015-05-13 DIAGNOSIS — H7292 Unspecified perforation of tympanic membrane, left ear: Secondary | ICD-10-CM | POA: Diagnosis not present

## 2015-05-13 DIAGNOSIS — H9212 Otorrhea, left ear: Secondary | ICD-10-CM | POA: Diagnosis not present

## 2015-05-13 DIAGNOSIS — J0300 Acute streptococcal tonsillitis, unspecified: Secondary | ICD-10-CM | POA: Diagnosis not present

## 2015-05-18 DIAGNOSIS — J4540 Moderate persistent asthma, uncomplicated: Secondary | ICD-10-CM | POA: Diagnosis not present

## 2015-05-18 DIAGNOSIS — R05 Cough: Secondary | ICD-10-CM | POA: Diagnosis not present

## 2015-05-24 ENCOUNTER — Encounter: Payer: Self-pay | Admitting: "Endocrinology

## 2015-05-25 ENCOUNTER — Telehealth: Payer: Self-pay | Admitting: Nutrition

## 2015-05-25 ENCOUNTER — Encounter: Payer: Medicare Other | Attending: "Endocrinology | Admitting: Nutrition

## 2015-05-25 ENCOUNTER — Encounter: Payer: Self-pay | Admitting: Nutrition

## 2015-05-25 VITALS — Ht 71.0 in | Wt 247.0 lb

## 2015-05-25 DIAGNOSIS — J45909 Unspecified asthma, uncomplicated: Secondary | ICD-10-CM | POA: Diagnosis not present

## 2015-05-25 DIAGNOSIS — N183 Chronic kidney disease, stage 3 (moderate): Secondary | ICD-10-CM | POA: Insufficient documentation

## 2015-05-25 DIAGNOSIS — E119 Type 2 diabetes mellitus without complications: Secondary | ICD-10-CM | POA: Diagnosis not present

## 2015-05-25 DIAGNOSIS — E118 Type 2 diabetes mellitus with unspecified complications: Secondary | ICD-10-CM

## 2015-05-25 DIAGNOSIS — Z794 Long term (current) use of insulin: Secondary | ICD-10-CM | POA: Diagnosis not present

## 2015-05-25 DIAGNOSIS — J309 Allergic rhinitis, unspecified: Secondary | ICD-10-CM | POA: Diagnosis not present

## 2015-05-25 DIAGNOSIS — IMO0002 Reserved for concepts with insufficient information to code with codable children: Secondary | ICD-10-CM

## 2015-05-25 DIAGNOSIS — E669 Obesity, unspecified: Secondary | ICD-10-CM

## 2015-05-25 DIAGNOSIS — E1165 Type 2 diabetes mellitus with hyperglycemia: Secondary | ICD-10-CM

## 2015-05-25 DIAGNOSIS — I10 Essential (primary) hypertension: Secondary | ICD-10-CM | POA: Diagnosis not present

## 2015-05-25 NOTE — Progress Notes (Signed)
  Medical Nutrition Therapy:  Appt start time: 1430 end time:  1500.   Assessment:  Primary concerns today: Diabetes Type 2. Here with his wife.  Meter brought in. Avg BS 7 day 139 mg/dl. 14 day- 154 mg/dl and 30 day - 149 mg/dl. Estimated A1C is about 7-7.5%, down from 10.4% a month ago. Doing very well.  He is on 50 units of Lantus . He has stopped his meal time insulin per Dr.  Dorris Fetch.. BS log looks excellent. He has been feeling weak in late evenings even though BS are not necessarily low--80-100's. Diet is doing very well and he is eating 45-60 grams most meals. Lost 9 lbs since last month. He wants to lose another 10 lbs. Hasn't been snacking. Drinks water. Compliant with medications. Play golf for exericse 3-4 times per week. Has cut down on portions and eating better balanced meals.  Lab Results  Component Value Date   HGBA1C 10.4* 04/22/2015       Preferred Learning Style:    Auditory  Visual  Hands on  No preference indicated    Learning Readiness:     Ready  Change in progress   MEDICATIONS: See   DIETARY INTAKE:  B) Egg sandwich on ww bread  Lunch pb and banana 1/2 banana , yogurt,  Water or unsweet tea D)  Usual physical activity: Golf  Estimated energy needs: 1800 calories 200 g carbohydrates 135 g protein 50 g fat  Progress Towards Goal(s):  In progress.   Nutritional Diagnosis:  NB-1.1 Food and nutrition-related knowledge deficit As related to Diabetes.  As evidenced by A1C 10.1%.    Intervention:  Nutrition and Diabetes education provided on My Plate, CHO counting, meal planning, portion sizes, timing of meals, avoiding snacks between meals unless having a low blood sugar, target ranges for A1C and blood sugars, signs/symptoms and treatment of hyper/hypoglycemia, monitoring blood sugars, taking medications as prescribed, benefits of exercising 30 minutes per day and prevention of complications of DM. Reviewed meal planning, and feeling of weakness or  fatigue as his body gets use to lower normal blood sugars. Treatment of low blood sugars.   Goals: KEEP UP THE GREAT JOB!! 1. Follow My Plate Method 2. Eat 3-4 carb choices per meal 3. Keep snack with you for low blood sugars. 42. Notify Dr. Dorris Fetch if BS are consistently below 70's 2-3 times in a row. 5. Get A1C to 7%. 6 .Lose 1 lb per week.    Teaching Method Utilized:  Visual Auditory Hands on  Handouts given during visit include:  The Plate Method  Meal Plan Card  Diabetes Instructions  Barriers to learning/adherence to lifestyle change:  None  Demonstrated degree of understanding via:  Teach Back   Monitoring/Evaluation:  Dietary intake, exercise, meal planning, SBG, and body weight in 1  month(s).

## 2015-05-25 NOTE — Telephone Encounter (Signed)
vm reminder appt for today.

## 2015-05-25 NOTE — Patient Instructions (Signed)
Goals: KEEP UP THE GREAT JOB!! 1. Follow My Plate Method 2. Eat 3-4 carb choices per meal 3. Keep snack with you for low blood sugars. 61. Notify Dr. Dorris Fetch if BS are consistently below 70's 2-3 times in a row. 5. Get A1C to 7%. 6 .Lose 1 lb per week.

## 2015-05-31 DIAGNOSIS — R05 Cough: Secondary | ICD-10-CM | POA: Diagnosis not present

## 2015-05-31 DIAGNOSIS — J4540 Moderate persistent asthma, uncomplicated: Secondary | ICD-10-CM | POA: Diagnosis not present

## 2015-06-01 DIAGNOSIS — Z794 Long term (current) use of insulin: Secondary | ICD-10-CM | POA: Diagnosis not present

## 2015-06-01 DIAGNOSIS — R600 Localized edema: Secondary | ICD-10-CM | POA: Diagnosis not present

## 2015-06-01 DIAGNOSIS — N183 Chronic kidney disease, stage 3 (moderate): Secondary | ICD-10-CM | POA: Diagnosis not present

## 2015-06-01 DIAGNOSIS — I83893 Varicose veins of bilateral lower extremities with other complications: Secondary | ICD-10-CM | POA: Diagnosis not present

## 2015-06-01 DIAGNOSIS — G2581 Restless legs syndrome: Secondary | ICD-10-CM | POA: Diagnosis not present

## 2015-06-01 DIAGNOSIS — G4730 Sleep apnea, unspecified: Secondary | ICD-10-CM | POA: Diagnosis not present

## 2015-06-01 DIAGNOSIS — J45909 Unspecified asthma, uncomplicated: Secondary | ICD-10-CM | POA: Diagnosis not present

## 2015-06-01 DIAGNOSIS — E1122 Type 2 diabetes mellitus with diabetic chronic kidney disease: Secondary | ICD-10-CM | POA: Diagnosis not present

## 2015-06-01 DIAGNOSIS — I83891 Varicose veins of right lower extremities with other complications: Secondary | ICD-10-CM | POA: Diagnosis not present

## 2015-06-01 DIAGNOSIS — I872 Venous insufficiency (chronic) (peripheral): Secondary | ICD-10-CM | POA: Diagnosis not present

## 2015-06-01 DIAGNOSIS — E669 Obesity, unspecified: Secondary | ICD-10-CM | POA: Diagnosis not present

## 2015-06-01 DIAGNOSIS — I83813 Varicose veins of bilateral lower extremities with pain: Secondary | ICD-10-CM | POA: Diagnosis not present

## 2015-06-01 DIAGNOSIS — M79604 Pain in right leg: Secondary | ICD-10-CM | POA: Diagnosis not present

## 2015-06-01 DIAGNOSIS — I129 Hypertensive chronic kidney disease with stage 1 through stage 4 chronic kidney disease, or unspecified chronic kidney disease: Secondary | ICD-10-CM | POA: Diagnosis not present

## 2015-06-01 DIAGNOSIS — Z7982 Long term (current) use of aspirin: Secondary | ICD-10-CM | POA: Diagnosis not present

## 2015-06-07 DIAGNOSIS — I872 Venous insufficiency (chronic) (peripheral): Secondary | ICD-10-CM | POA: Diagnosis not present

## 2015-06-07 DIAGNOSIS — G2581 Restless legs syndrome: Secondary | ICD-10-CM | POA: Diagnosis not present

## 2015-06-07 DIAGNOSIS — I129 Hypertensive chronic kidney disease with stage 1 through stage 4 chronic kidney disease, or unspecified chronic kidney disease: Secondary | ICD-10-CM | POA: Diagnosis not present

## 2015-06-07 DIAGNOSIS — E669 Obesity, unspecified: Secondary | ICD-10-CM | POA: Diagnosis not present

## 2015-06-07 DIAGNOSIS — G4730 Sleep apnea, unspecified: Secondary | ICD-10-CM | POA: Diagnosis not present

## 2015-06-07 DIAGNOSIS — I83813 Varicose veins of bilateral lower extremities with pain: Secondary | ICD-10-CM | POA: Diagnosis not present

## 2015-06-07 DIAGNOSIS — E1122 Type 2 diabetes mellitus with diabetic chronic kidney disease: Secondary | ICD-10-CM | POA: Diagnosis not present

## 2015-06-07 DIAGNOSIS — I83893 Varicose veins of bilateral lower extremities with other complications: Secondary | ICD-10-CM | POA: Diagnosis not present

## 2015-06-07 DIAGNOSIS — N183 Chronic kidney disease, stage 3 (moderate): Secondary | ICD-10-CM | POA: Diagnosis not present

## 2015-06-14 DIAGNOSIS — R05 Cough: Secondary | ICD-10-CM | POA: Diagnosis not present

## 2015-06-14 DIAGNOSIS — J4540 Moderate persistent asthma, uncomplicated: Secondary | ICD-10-CM | POA: Diagnosis not present

## 2015-06-27 DIAGNOSIS — J309 Allergic rhinitis, unspecified: Secondary | ICD-10-CM | POA: Diagnosis not present

## 2015-06-27 DIAGNOSIS — C4491 Basal cell carcinoma of skin, unspecified: Secondary | ICD-10-CM

## 2015-06-27 DIAGNOSIS — L570 Actinic keratosis: Secondary | ICD-10-CM | POA: Diagnosis not present

## 2015-06-27 DIAGNOSIS — C44219 Basal cell carcinoma of skin of left ear and external auricular canal: Secondary | ICD-10-CM | POA: Diagnosis not present

## 2015-06-27 DIAGNOSIS — D044 Carcinoma in situ of skin of scalp and neck: Secondary | ICD-10-CM | POA: Diagnosis not present

## 2015-06-27 DIAGNOSIS — H6532 Chronic mucoid otitis media, left ear: Secondary | ICD-10-CM | POA: Diagnosis not present

## 2015-06-27 HISTORY — DX: Basal cell carcinoma of skin, unspecified: C44.91

## 2015-06-29 DIAGNOSIS — R05 Cough: Secondary | ICD-10-CM | POA: Diagnosis not present

## 2015-06-29 DIAGNOSIS — J4540 Moderate persistent asthma, uncomplicated: Secondary | ICD-10-CM | POA: Diagnosis not present

## 2015-07-11 DIAGNOSIS — R05 Cough: Secondary | ICD-10-CM | POA: Diagnosis not present

## 2015-07-11 DIAGNOSIS — J324 Chronic pansinusitis: Secondary | ICD-10-CM | POA: Diagnosis not present

## 2015-07-11 DIAGNOSIS — G4734 Idiopathic sleep related nonobstructive alveolar hypoventilation: Secondary | ICD-10-CM | POA: Diagnosis not present

## 2015-07-11 DIAGNOSIS — G4733 Obstructive sleep apnea (adult) (pediatric): Secondary | ICD-10-CM | POA: Diagnosis not present

## 2015-07-11 DIAGNOSIS — J4540 Moderate persistent asthma, uncomplicated: Secondary | ICD-10-CM | POA: Diagnosis not present

## 2015-07-11 DIAGNOSIS — R768 Other specified abnormal immunological findings in serum: Secondary | ICD-10-CM | POA: Diagnosis not present

## 2015-07-14 DIAGNOSIS — J4540 Moderate persistent asthma, uncomplicated: Secondary | ICD-10-CM | POA: Diagnosis not present

## 2015-07-14 DIAGNOSIS — R05 Cough: Secondary | ICD-10-CM | POA: Diagnosis not present

## 2015-07-20 DIAGNOSIS — I83891 Varicose veins of right lower extremities with other complications: Secondary | ICD-10-CM | POA: Diagnosis not present

## 2015-07-20 DIAGNOSIS — M79604 Pain in right leg: Secondary | ICD-10-CM | POA: Diagnosis not present

## 2015-07-20 DIAGNOSIS — R600 Localized edema: Secondary | ICD-10-CM | POA: Diagnosis not present

## 2015-07-20 DIAGNOSIS — E1122 Type 2 diabetes mellitus with diabetic chronic kidney disease: Secondary | ICD-10-CM | POA: Diagnosis not present

## 2015-07-20 DIAGNOSIS — E669 Obesity, unspecified: Secondary | ICD-10-CM | POA: Diagnosis not present

## 2015-07-20 DIAGNOSIS — Z794 Long term (current) use of insulin: Secondary | ICD-10-CM | POA: Diagnosis not present

## 2015-07-20 DIAGNOSIS — I8311 Varicose veins of right lower extremity with inflammation: Secondary | ICD-10-CM | POA: Diagnosis not present

## 2015-07-20 DIAGNOSIS — I129 Hypertensive chronic kidney disease with stage 1 through stage 4 chronic kidney disease, or unspecified chronic kidney disease: Secondary | ICD-10-CM | POA: Diagnosis not present

## 2015-07-20 DIAGNOSIS — I8312 Varicose veins of left lower extremity with inflammation: Secondary | ICD-10-CM | POA: Diagnosis not present

## 2015-07-20 DIAGNOSIS — G2581 Restless legs syndrome: Secondary | ICD-10-CM | POA: Diagnosis not present

## 2015-07-20 DIAGNOSIS — J45909 Unspecified asthma, uncomplicated: Secondary | ICD-10-CM | POA: Diagnosis not present

## 2015-07-20 DIAGNOSIS — Z7982 Long term (current) use of aspirin: Secondary | ICD-10-CM | POA: Diagnosis not present

## 2015-07-20 DIAGNOSIS — N183 Chronic kidney disease, stage 3 (moderate): Secondary | ICD-10-CM | POA: Diagnosis not present

## 2015-07-20 DIAGNOSIS — G4730 Sleep apnea, unspecified: Secondary | ICD-10-CM | POA: Diagnosis not present

## 2015-07-20 DIAGNOSIS — I83893 Varicose veins of bilateral lower extremities with other complications: Secondary | ICD-10-CM | POA: Diagnosis not present

## 2015-07-22 DIAGNOSIS — N183 Chronic kidney disease, stage 3 (moderate): Secondary | ICD-10-CM | POA: Diagnosis not present

## 2015-07-22 DIAGNOSIS — E1122 Type 2 diabetes mellitus with diabetic chronic kidney disease: Secondary | ICD-10-CM | POA: Diagnosis not present

## 2015-07-22 DIAGNOSIS — E1165 Type 2 diabetes mellitus with hyperglycemia: Secondary | ICD-10-CM | POA: Diagnosis not present

## 2015-07-22 DIAGNOSIS — Z794 Long term (current) use of insulin: Secondary | ICD-10-CM | POA: Diagnosis not present

## 2015-07-23 DIAGNOSIS — E669 Obesity, unspecified: Secondary | ICD-10-CM | POA: Diagnosis not present

## 2015-07-23 DIAGNOSIS — I129 Hypertensive chronic kidney disease with stage 1 through stage 4 chronic kidney disease, or unspecified chronic kidney disease: Secondary | ICD-10-CM | POA: Diagnosis not present

## 2015-07-23 DIAGNOSIS — Z794 Long term (current) use of insulin: Secondary | ICD-10-CM | POA: Diagnosis not present

## 2015-07-23 DIAGNOSIS — N183 Chronic kidney disease, stage 3 (moderate): Secondary | ICD-10-CM | POA: Diagnosis not present

## 2015-07-23 DIAGNOSIS — I83893 Varicose veins of bilateral lower extremities with other complications: Secondary | ICD-10-CM | POA: Diagnosis not present

## 2015-07-23 DIAGNOSIS — Z7982 Long term (current) use of aspirin: Secondary | ICD-10-CM | POA: Diagnosis not present

## 2015-07-23 DIAGNOSIS — I83813 Varicose veins of bilateral lower extremities with pain: Secondary | ICD-10-CM | POA: Diagnosis not present

## 2015-07-23 DIAGNOSIS — J45909 Unspecified asthma, uncomplicated: Secondary | ICD-10-CM | POA: Diagnosis not present

## 2015-07-23 DIAGNOSIS — I8311 Varicose veins of right lower extremity with inflammation: Secondary | ICD-10-CM | POA: Diagnosis not present

## 2015-07-23 DIAGNOSIS — E1122 Type 2 diabetes mellitus with diabetic chronic kidney disease: Secondary | ICD-10-CM | POA: Diagnosis not present

## 2015-07-23 DIAGNOSIS — G4730 Sleep apnea, unspecified: Secondary | ICD-10-CM | POA: Diagnosis not present

## 2015-07-23 DIAGNOSIS — I8312 Varicose veins of left lower extremity with inflammation: Secondary | ICD-10-CM | POA: Diagnosis not present

## 2015-07-23 DIAGNOSIS — G2581 Restless legs syndrome: Secondary | ICD-10-CM | POA: Diagnosis not present

## 2015-07-23 LAB — CMP14+EGFR
A/G RATIO: 1.8 (ref 1.2–2.2)
ALK PHOS: 59 IU/L (ref 39–117)
ALT: 14 IU/L (ref 0–44)
AST: 18 IU/L (ref 0–40)
Albumin: 4.2 g/dL (ref 3.6–4.8)
BILIRUBIN TOTAL: 0.3 mg/dL (ref 0.0–1.2)
BUN/Creatinine Ratio: 18 (ref 10–24)
BUN: 30 mg/dL — ABNORMAL HIGH (ref 8–27)
CHLORIDE: 102 mmol/L (ref 96–106)
CO2: 24 mmol/L (ref 18–29)
Calcium: 9.2 mg/dL (ref 8.6–10.2)
Creatinine, Ser: 1.68 mg/dL — ABNORMAL HIGH (ref 0.76–1.27)
GFR calc Af Amer: 49 mL/min/{1.73_m2} — ABNORMAL LOW (ref >59)
GFR calc non Af Amer: 42 mL/min/{1.73_m2} — ABNORMAL LOW (ref >59)
GLOBULIN, TOTAL: 2.4 g/dL (ref 1.5–4.5)
Glucose: 180 mg/dL — ABNORMAL HIGH (ref 65–99)
POTASSIUM: 4.5 mmol/L (ref 3.5–5.2)
SODIUM: 141 mmol/L (ref 134–144)
Total Protein: 6.6 g/dL (ref 6.0–8.5)

## 2015-07-23 LAB — HEMOGLOBIN A1C
ESTIMATED AVERAGE GLUCOSE: 180 mg/dL
HEMOGLOBIN A1C: 7.9 % — AB (ref 4.8–5.6)

## 2015-08-01 DIAGNOSIS — J4540 Moderate persistent asthma, uncomplicated: Secondary | ICD-10-CM | POA: Diagnosis not present

## 2015-08-01 DIAGNOSIS — R05 Cough: Secondary | ICD-10-CM | POA: Diagnosis not present

## 2015-08-04 DIAGNOSIS — D044 Carcinoma in situ of skin of scalp and neck: Secondary | ICD-10-CM | POA: Diagnosis not present

## 2015-08-04 DIAGNOSIS — C44221 Squamous cell carcinoma of skin of unspecified ear and external auricular canal: Secondary | ICD-10-CM | POA: Diagnosis not present

## 2015-08-05 DIAGNOSIS — I129 Hypertensive chronic kidney disease with stage 1 through stage 4 chronic kidney disease, or unspecified chronic kidney disease: Secondary | ICD-10-CM | POA: Diagnosis not present

## 2015-08-05 DIAGNOSIS — R600 Localized edema: Secondary | ICD-10-CM | POA: Diagnosis not present

## 2015-08-05 DIAGNOSIS — I8312 Varicose veins of left lower extremity with inflammation: Secondary | ICD-10-CM | POA: Diagnosis not present

## 2015-08-05 DIAGNOSIS — M79605 Pain in left leg: Secondary | ICD-10-CM | POA: Diagnosis not present

## 2015-08-05 DIAGNOSIS — I83893 Varicose veins of bilateral lower extremities with other complications: Secondary | ICD-10-CM | POA: Diagnosis not present

## 2015-08-05 DIAGNOSIS — G2581 Restless legs syndrome: Secondary | ICD-10-CM | POA: Diagnosis not present

## 2015-08-05 DIAGNOSIS — I8311 Varicose veins of right lower extremity with inflammation: Secondary | ICD-10-CM | POA: Diagnosis not present

## 2015-08-05 DIAGNOSIS — I83892 Varicose veins of left lower extremities with other complications: Secondary | ICD-10-CM | POA: Diagnosis not present

## 2015-08-05 DIAGNOSIS — I83813 Varicose veins of bilateral lower extremities with pain: Secondary | ICD-10-CM | POA: Diagnosis not present

## 2015-08-08 DIAGNOSIS — E669 Obesity, unspecified: Secondary | ICD-10-CM | POA: Diagnosis not present

## 2015-08-08 DIAGNOSIS — I83893 Varicose veins of bilateral lower extremities with other complications: Secondary | ICD-10-CM | POA: Diagnosis not present

## 2015-08-08 DIAGNOSIS — I8311 Varicose veins of right lower extremity with inflammation: Secondary | ICD-10-CM | POA: Diagnosis not present

## 2015-08-08 DIAGNOSIS — N183 Chronic kidney disease, stage 3 (moderate): Secondary | ICD-10-CM | POA: Diagnosis not present

## 2015-08-08 DIAGNOSIS — E1122 Type 2 diabetes mellitus with diabetic chronic kidney disease: Secondary | ICD-10-CM | POA: Diagnosis not present

## 2015-08-08 DIAGNOSIS — G2581 Restless legs syndrome: Secondary | ICD-10-CM | POA: Diagnosis not present

## 2015-08-08 DIAGNOSIS — I129 Hypertensive chronic kidney disease with stage 1 through stage 4 chronic kidney disease, or unspecified chronic kidney disease: Secondary | ICD-10-CM | POA: Diagnosis not present

## 2015-08-08 DIAGNOSIS — I8312 Varicose veins of left lower extremity with inflammation: Secondary | ICD-10-CM | POA: Diagnosis not present

## 2015-08-08 DIAGNOSIS — I83813 Varicose veins of bilateral lower extremities with pain: Secondary | ICD-10-CM | POA: Diagnosis not present

## 2015-08-15 ENCOUNTER — Ambulatory Visit (INDEPENDENT_AMBULATORY_CARE_PROVIDER_SITE_OTHER): Payer: Medicare Other | Admitting: "Endocrinology

## 2015-08-15 ENCOUNTER — Encounter: Payer: Medicare Other | Attending: "Endocrinology | Admitting: Nutrition

## 2015-08-15 ENCOUNTER — Encounter: Payer: Self-pay | Admitting: "Endocrinology

## 2015-08-15 VITALS — BP 132/74 | HR 62 | Resp 18 | Ht 71.0 in | Wt 242.0 lb

## 2015-08-15 VITALS — Wt 242.0 lb

## 2015-08-15 DIAGNOSIS — IMO0002 Reserved for concepts with insufficient information to code with codable children: Secondary | ICD-10-CM

## 2015-08-15 DIAGNOSIS — E1122 Type 2 diabetes mellitus with diabetic chronic kidney disease: Secondary | ICD-10-CM

## 2015-08-15 DIAGNOSIS — E1165 Type 2 diabetes mellitus with hyperglycemia: Secondary | ICD-10-CM

## 2015-08-15 DIAGNOSIS — N183 Chronic kidney disease, stage 3 (moderate): Secondary | ICD-10-CM

## 2015-08-15 DIAGNOSIS — E119 Type 2 diabetes mellitus without complications: Secondary | ICD-10-CM | POA: Diagnosis not present

## 2015-08-15 DIAGNOSIS — E118 Type 2 diabetes mellitus with unspecified complications: Secondary | ICD-10-CM

## 2015-08-15 DIAGNOSIS — Z794 Long term (current) use of insulin: Secondary | ICD-10-CM | POA: Diagnosis not present

## 2015-08-15 DIAGNOSIS — I10 Essential (primary) hypertension: Secondary | ICD-10-CM

## 2015-08-15 DIAGNOSIS — E785 Hyperlipidemia, unspecified: Secondary | ICD-10-CM

## 2015-08-15 DIAGNOSIS — E669 Obesity, unspecified: Secondary | ICD-10-CM

## 2015-08-15 NOTE — Progress Notes (Signed)
Subjective:    Patient ID: Allen [name], male    DOB: July 18, 1950. Patient is being seen in f/u for management of diabetes requested by  [facility], MD  Past Medical History:  Diagnosis Date  . Diabetes mellitus, type II ([place])   . Hyperlipidemia   . Hypertension   . Kidney disease    Past Surgical History:  Procedure Laterality Date  . carpal tunnel    . CHOLECYSTECTOMY    . NASAL SINUS SURGERY     Social History   Social History  . Marital status: Married    Spouse name: N/A  . Number of children: N/A  . Years of education: N/A   Social History Main Topics  . Smoking status: Never Smoker  . Smokeless tobacco: None  . Alcohol use No  . Drug use: No  . Sexual activity: Not Asked   Other Topics Concern  . None   Social History Narrative  . None   Outpatient Encounter Prescriptions as of 08/15/2015  Medication Sig  . amLODipine (NORVASC) 10 MG tablet Take 10 mg by mouth daily.  Marland Kitchen atorvastatin (LIPITOR) 20 MG tablet Take 20 mg by mouth daily.  Marland Kitchen eplerenone (INSPRA) 50 MG tablet Take 50 mg by mouth daily.  . fenofibrate (TRICOR) 145 MG tablet Take 145 mg by mouth daily.  . Fluticasone-Salmeterol (ADVAIR DISKUS) 500-50 MCG/DOSE AEPB Inhale 1 puff into the lungs daily.  Marland Kitchen gabapentin (NEURONTIN) 300 MG capsule Take 300 mg by mouth at bedtime.  . Insulin Glargine (LANTUS SOLOSTAR) 100 UNIT/ML Solostar Pen Inject 50 Units into the skin daily at 10 pm.  . losartan (COZAAR) 100 MG tablet Take 100 mg by mouth daily.  . metFORMIN (GLUCOPHAGE) 500 MG tablet Take 1 tablet (500 mg total) by mouth 2 (two) times daily with a meal.  . metoprolol (LOPRESSOR) 50 MG tablet Take 50 mg by mouth every morning.  . mometasone (NASONEX) 50 MCG/ACT nasal spray Place 2 sprays into the nose daily.  . montelukast (SINGULAIR) 10 MG tablet Take 10 mg by mouth at bedtime.  . Omega-3 Fatty Acids (FISH OIL) 1000 MG CAPS Take by mouth 2 (two) times daily.  . sitaGLIPtin (JANUVIA) 25 MG tablet  Take 1 tablet (25 mg total) by mouth daily.   No facility-administered encounter medications on file as of 08/15/2015.    ALLERGIES: No Known Allergies VACCINATION STATUS:  There is no immunization history on file for this patient.  Diabetes  He presents for his follow-up diabetic visit. He has type 2 diabetes mellitus. Onset time: He was diagnosed at approximate age of 65 years. His disease course has been improving. There are no hypoglycemic associated symptoms. Pertinent negatives for hypoglycemia include no confusion, headaches, pallor or seizures. Pertinent negatives for diabetes include no chest pain, no fatigue, no polydipsia, no polyphagia, no polyuria and no weakness. There are no hypoglycemic complications. Symptoms are improving. Diabetic complications include nephropathy. Risk factors for coronary artery disease include diabetes mellitus, dyslipidemia, hypertension, male sex, sedentary lifestyle and obesity. Current diabetic treatment includes insulin injections (He is on Lantus 78 units nightly and NovoLog 30-40 units 3 times a day before meals.). His weight is decreasing steadily. He is following a generally unhealthy diet. When asked about meal planning, he reported none. He has not had a previous visit with a dietitian. He participates in exercise intermittently (He participates in golfing activities.). His breakfast blood glucose range is generally 140-180 mg/dl. His dinner blood glucose range is generally 140-180 mg/dl.  His overall blood glucose range is 140-180 mg/dl. An ACE inhibitor/angiotensin II receptor blocker is being taken. Eye exam is current.  Hyperlipidemia  This is a chronic problem. The current episode started more than 1 year ago. The problem is uncontrolled. Exacerbating diseases include chronic renal disease, diabetes and obesity. Pertinent negatives include no chest pain, myalgias or shortness of breath. Current antihyperlipidemic treatment includes statins and bile  acid squestrants. Compliance problems include adherence to diet.  Risk factors for coronary artery disease include diabetes mellitus, dyslipidemia, hypertension, male sex, obesity and a sedentary lifestyle.  Hypertension  This is a chronic problem. The current episode started more than 1 year ago. The problem is uncontrolled. Pertinent negatives include no chest pain, headaches, neck pain, palpitations or shortness of breath. Risk factors for coronary artery disease include diabetes mellitus. Past treatments include angiotensin blockers. Hypertensive end-organ damage includes kidney disease. Identifiable causes of hypertension include chronic renal disease.     Review of Systems  Constitutional: Negative for chills, fatigue, fever and unexpected weight change.  HENT: Negative for dental problem, mouth sores and trouble swallowing.   Eyes: Negative for visual disturbance.  Respiratory: Negative for cough, choking, chest tightness, shortness of breath and wheezing.   Cardiovascular: Negative for chest pain, palpitations and leg swelling.  Gastrointestinal: Negative for abdominal distention, abdominal pain, constipation, diarrhea, nausea and vomiting.  Endocrine: Negative for polydipsia, polyphagia and polyuria.  Genitourinary: Negative for dysuria, flank pain, hematuria and urgency.  Musculoskeletal: Negative for back pain, gait problem, myalgias and neck pain.  Skin: Negative for pallor, rash and wound.  Neurological: Negative for seizures, syncope, weakness, numbness and headaches.  Psychiatric/Behavioral: Negative.  Negative for confusion and dysphoric mood.    Objective:    BP 132/74 (BP Location: Right Arm, Patient Position: Sitting, Cuff Size: Large)   Pulse 62   Resp 18   Ht 5\' 11"  (1.803 m)   Wt 242 lb (109.8 kg)   SpO2 94%   BMI 33.75 kg/m   Wt Readings from Last 3 Encounters:  08/15/15 242 lb (109.8 kg)  05/25/15 247 lb (112 kg)  05/02/15 252 lb (114.3 kg)    Physical Exam   Constitutional: He is oriented to person, place, and time. He appears well-developed and well-nourished. He is cooperative. No distress.  HENT:  Head: Normocephalic and atraumatic.  Eyes: EOM are normal.  Neck: Normal range of motion. Neck supple. No tracheal deviation present. No thyromegaly present.  Cardiovascular: Normal rate, S1 normal, S2 normal and normal heart sounds.  Exam reveals no gallop.   No murmur heard. Pulses:      Dorsalis pedis pulses are 1+ on the right side, and 1+ on the left side.       Posterior tibial pulses are 1+ on the right side, and 1+ on the left side.  Pulmonary/Chest: Breath sounds normal. No respiratory distress. He has no wheezes.  Abdominal: Soft. Bowel sounds are normal. He exhibits no distension. There is no tenderness. There is no guarding and no CVA tenderness.  Musculoskeletal: He exhibits no edema.       Right shoulder: He exhibits no swelling and no deformity.  Neurological: He is alert and oriented to person, place, and time. He has normal strength and normal reflexes. No cranial nerve deficit or sensory deficit. Gait normal.  Skin: Skin is warm and dry. No rash noted. No cyanosis. Nails show no clubbing.  Psychiatric: He has a normal mood and affect. His speech is normal and behavior is  normal. Judgment and thought content normal. Cognition and memory are normal.    Assessment & Plan:   1. Uncontrolled type 2 diabetes mellitus with stage 3 chronic kidney disease, with long-term current use of insulin ([place]) - Patient has currently uncontrolled symptomatic type 2 DM since  65 years of age,  with last A1c of  7.9% improving from 10.4%.   his diabetes is complicated by mild  CKD  and patient remains at a high risk for more acute and chronic complications of diabetes which include CAD, CVA, CKD, retinopathy, and neuropathy. These are all discussed in detail with the patient.  - I have counseled the patient on diet management and weight loss, by  adopting a carbohydrate restricted/protein rich diet.  - Suggestion is made for patient to avoid simple carbohydrates   from their diet including Cakes , Desserts, Ice Cream,  Soda (  diet and regular) , Sweet Tea , Candies,  Chips, Cookies, Artificial Sweeteners,   and "Sugar-free" Products . This will help patient to have stable blood glucose profile and potentially avoid unintended weight gain.  - I encouraged the patient to switch to  unprocessed or minimally processed complex starch and increased protein intake (animal or plant source), fruits, and vegetables.  - Patient is advised to stick to a routine mealtimes to eat 3 meals  a day and avoid unnecessary snacks ( to snack only to correct hypoglycemia).  - The patient will be scheduled with Jearld Fenton, RDN, CDE for individualized DM education.  - I have approached patient with the following individualized plan to manage diabetes and patient agrees:   - I  Will proceed only with basal insulin lantus  50  units QHS, associated with strict monitoring of glucose  AC breakfast and at bedtime. -Given his commitment and proper engagement, he can avoid prandial insulin for now. -Patient is encouraged to call clinic for blood glucose levels less than 70 or above 200 mg /dl. - He is paying $155 for Januvia, and he complains. I advised him to stop Januvia after finishing his current supplies.  - He will be considered for Tradjenta if he loses control. -His GFR is 47, I will continue low-dose metformin 500 mg by mouth twice a day.  -Reportedly, he did not tolerate Trulicity .  - Patient specific target  A1c;  LDL, HDL, Triglycerides, and  Waist Circumference were discussed in detail.  2) BP/HTN: Controlled. Continue current medications including ACEI/ARB. 3) Lipids/HPL:  Uncontrolled, triglycerides 267 LDL is 74  continue statins, TriCor and , omega-3 fatty acids.  4)  Weight/Diet: CDE Consult will be initiated , exercise, and detailed  carbohydrates information provided.  5) Chronic Care/Health Maintenance:  -Patient is on ACEI/ARB and Statin medications and encouraged to continue to follow up with Ophthalmology, Podiatrist at least yearly or according to recommendations, and advised to   stay away from smoking. I have recommended yearly flu vaccine and pneumonia vaccination at least every 5 years; moderate intensity exercise for up to 150 minutes weekly; and  sleep for at least 7 hours a day.  - 25 minutes of time was spent on the care of this patient , 50% of which was applied for counseling on diabetes complications and their preventions. - I advised patient to maintain close follow up with [facility], MD for primary care needs.  Follow up plan: - Return in about 3 months (around 11/15/2015) for follow up with pre-visit labs, meter, and logs.  Glade Lloyd, MD Phone: [phone]  Fax: [phone]   08/15/2015, 3:33 PM

## 2015-08-15 NOTE — Patient Instructions (Signed)

## 2015-08-16 DIAGNOSIS — J4540 Moderate persistent asthma, uncomplicated: Secondary | ICD-10-CM | POA: Diagnosis not present

## 2015-08-16 DIAGNOSIS — R05 Cough: Secondary | ICD-10-CM | POA: Diagnosis not present

## 2015-08-17 ENCOUNTER — Encounter: Payer: Self-pay | Admitting: Nutrition

## 2015-08-17 NOTE — Patient Instructions (Signed)
Goals: KEEP UP THE GREAT JOB!! 1. Follow My Plate Method 2. Eat 3-4 carb choices per meal 3. Keep snack with you for low blood sugars. 61. Notify Dr. Dorris Fetch if BS are consistently below 70's 2-3 times in a row. 5. Get A1C to 7%. 6 .Lose 1 lb per week.

## 2015-08-17 NOTE — Progress Notes (Signed)
  Medical Nutrition Therapy:  Appt start time: 1600 end time:  1630.  Assessment:  Primary concerns today: Diabetes Type 2.  Here with his wife.  Meter brought in. Avg BS 7 day 139 mg/dl. 14 day- 154 mg/dl and 30 day - 149 mg/dl. Estimated A1C is about 7-7.5%, down from 10.4% a month ago. Doing very well.  He is on 50 units of Lantus . He has stopped his meal time insulin per Dr.  Dorris Fetch.. BS log looks excellent. He has been feeling weak in late evenings even though BS are not necessarily low--80-100's. Diet is doing very well and he is eating 45-60 grams most meals. Lost 9 lbs since last month. He wants to lose another 10 lbs. Hasn't been snacking. Drinks water. Compliant with medications. Play golf for exericse 3-4 times per week. Has cut down on portions and eating better balanced meals.      Lab Results  Component Value Date   HGBA1C 7.9 (H) 07/22/2015       Preferred Learning Style:    Auditory  Visual  Hands on  No preference indicated    Learning Readiness:     Ready  Change in progress   MEDICATIONS: See   DIETARY INTAKE:  B) Egg sandwich on ww bread  Lunch pb and banana 1/2 banana , yogurt,  Water or unsweet tea D)  Usual physical activity: Golf  Estimated energy needs: 1800 calories 200 g carbohydrates 135 g protein 50 g fat  Progress Towards Goal(s):  In progress.   Nutritional Diagnosis:  NB-1.1 Food and nutrition-related knowledge deficit As related to Diabetes.  As evidenced by A1C 10.1%.    Intervention:  Nutrition and Diabetes education provided on My Plate, CHO counting, meal planning, portion sizes, timing of meals, avoiding snacks between meals unless having a low blood sugar, target ranges for A1C and blood sugars, signs/symptoms and treatment of hyper/hypoglycemia, monitoring blood sugars, taking medications as prescribed, benefits of exercising 30 minutes per day and prevention of complications of DM. Reviewed meal planning, and feeling of  weakness or fatigue as his body gets use to lower normal blood sugars. Treatment of low blood sugars.   Goals: KEEP UP THE GREAT JOB!! 1. Follow My Plate Method 2. Eat 3-4 carb choices per meal 3. Keep snack with you for low blood sugars. 34. Notify Dr. Dorris Fetch if BS are consistently below 70's 2-3 times in a row. 5. Get A1C to 7%. 6 .Lose 1 lb per week.  Teaching Method Utilized:  Visual Auditory Hands on  Handouts given during visit include:  The Plate Method  Meal Plan Card  Diabetes Instructions  Barriers to learning/adherence to lifestyle change:  None  Demonstrated degree of understanding via:  Teach Back   Monitoring/Evaluation:  Dietary intake, exercise, meal planning, SBG, and body weight in 3  month(s).   Pt seen 08/15/15 and notes completed 08/17/15

## 2015-08-30 DIAGNOSIS — R05 Cough: Secondary | ICD-10-CM | POA: Diagnosis not present

## 2015-08-30 DIAGNOSIS — J4540 Moderate persistent asthma, uncomplicated: Secondary | ICD-10-CM | POA: Diagnosis not present

## 2015-08-31 DIAGNOSIS — J0121 Acute recurrent ethmoidal sinusitis: Secondary | ICD-10-CM | POA: Diagnosis not present

## 2015-09-14 ENCOUNTER — Other Ambulatory Visit: Payer: Self-pay | Admitting: "Endocrinology

## 2015-09-14 DIAGNOSIS — I129 Hypertensive chronic kidney disease with stage 1 through stage 4 chronic kidney disease, or unspecified chronic kidney disease: Secondary | ICD-10-CM | POA: Diagnosis not present

## 2015-09-14 DIAGNOSIS — I8311 Varicose veins of right lower extremity with inflammation: Secondary | ICD-10-CM | POA: Diagnosis not present

## 2015-09-14 DIAGNOSIS — I83893 Varicose veins of bilateral lower extremities with other complications: Secondary | ICD-10-CM | POA: Diagnosis not present

## 2015-09-14 DIAGNOSIS — R600 Localized edema: Secondary | ICD-10-CM | POA: Diagnosis not present

## 2015-09-14 DIAGNOSIS — E669 Obesity, unspecified: Secondary | ICD-10-CM | POA: Diagnosis not present

## 2015-09-14 DIAGNOSIS — J45909 Unspecified asthma, uncomplicated: Secondary | ICD-10-CM | POA: Diagnosis not present

## 2015-09-14 DIAGNOSIS — M79605 Pain in left leg: Secondary | ICD-10-CM | POA: Diagnosis not present

## 2015-09-14 DIAGNOSIS — Z794 Long term (current) use of insulin: Secondary | ICD-10-CM | POA: Diagnosis not present

## 2015-09-14 DIAGNOSIS — J4540 Moderate persistent asthma, uncomplicated: Secondary | ICD-10-CM | POA: Diagnosis not present

## 2015-09-14 DIAGNOSIS — N183 Chronic kidney disease, stage 3 (moderate): Secondary | ICD-10-CM | POA: Diagnosis not present

## 2015-09-14 DIAGNOSIS — R05 Cough: Secondary | ICD-10-CM | POA: Diagnosis not present

## 2015-09-14 DIAGNOSIS — G2581 Restless legs syndrome: Secondary | ICD-10-CM | POA: Diagnosis not present

## 2015-09-14 DIAGNOSIS — E1122 Type 2 diabetes mellitus with diabetic chronic kidney disease: Secondary | ICD-10-CM | POA: Diagnosis not present

## 2015-09-14 DIAGNOSIS — I83892 Varicose veins of left lower extremities with other complications: Secondary | ICD-10-CM | POA: Diagnosis not present

## 2015-09-14 DIAGNOSIS — Z7982 Long term (current) use of aspirin: Secondary | ICD-10-CM | POA: Diagnosis not present

## 2015-09-14 DIAGNOSIS — G4730 Sleep apnea, unspecified: Secondary | ICD-10-CM | POA: Diagnosis not present

## 2015-09-14 DIAGNOSIS — I8312 Varicose veins of left lower extremity with inflammation: Secondary | ICD-10-CM | POA: Diagnosis not present

## 2015-09-15 DIAGNOSIS — J0121 Acute recurrent ethmoidal sinusitis: Secondary | ICD-10-CM | POA: Diagnosis not present

## 2015-09-19 DIAGNOSIS — J45909 Unspecified asthma, uncomplicated: Secondary | ICD-10-CM | POA: Diagnosis not present

## 2015-09-19 DIAGNOSIS — I129 Hypertensive chronic kidney disease with stage 1 through stage 4 chronic kidney disease, or unspecified chronic kidney disease: Secondary | ICD-10-CM | POA: Diagnosis not present

## 2015-09-19 DIAGNOSIS — I8311 Varicose veins of right lower extremity with inflammation: Secondary | ICD-10-CM | POA: Diagnosis not present

## 2015-09-19 DIAGNOSIS — I8312 Varicose veins of left lower extremity with inflammation: Secondary | ICD-10-CM | POA: Diagnosis not present

## 2015-09-19 DIAGNOSIS — N183 Chronic kidney disease, stage 3 (moderate): Secondary | ICD-10-CM | POA: Diagnosis not present

## 2015-09-19 DIAGNOSIS — E1122 Type 2 diabetes mellitus with diabetic chronic kidney disease: Secondary | ICD-10-CM | POA: Diagnosis not present

## 2015-09-19 DIAGNOSIS — E669 Obesity, unspecified: Secondary | ICD-10-CM | POA: Diagnosis not present

## 2015-09-19 DIAGNOSIS — I83893 Varicose veins of bilateral lower extremities with other complications: Secondary | ICD-10-CM | POA: Diagnosis not present

## 2015-09-19 DIAGNOSIS — G2581 Restless legs syndrome: Secondary | ICD-10-CM | POA: Diagnosis not present

## 2015-09-28 DIAGNOSIS — J4540 Moderate persistent asthma, uncomplicated: Secondary | ICD-10-CM | POA: Diagnosis not present

## 2015-09-28 DIAGNOSIS — R05 Cough: Secondary | ICD-10-CM | POA: Diagnosis not present

## 2015-10-11 DIAGNOSIS — J322 Chronic ethmoidal sinusitis: Secondary | ICD-10-CM | POA: Diagnosis not present

## 2015-10-11 DIAGNOSIS — R768 Other specified abnormal immunological findings in serum: Secondary | ICD-10-CM | POA: Diagnosis not present

## 2015-10-11 DIAGNOSIS — J45991 Cough variant asthma: Secondary | ICD-10-CM | POA: Diagnosis not present

## 2015-10-11 DIAGNOSIS — Z9989 Dependence on other enabling machines and devices: Secondary | ICD-10-CM | POA: Diagnosis not present

## 2015-10-11 DIAGNOSIS — R05 Cough: Secondary | ICD-10-CM | POA: Diagnosis not present

## 2015-10-11 DIAGNOSIS — J3089 Other allergic rhinitis: Secondary | ICD-10-CM | POA: Diagnosis not present

## 2015-10-11 DIAGNOSIS — G4733 Obstructive sleep apnea (adult) (pediatric): Secondary | ICD-10-CM | POA: Diagnosis not present

## 2015-10-12 DIAGNOSIS — R05 Cough: Secondary | ICD-10-CM | POA: Diagnosis not present

## 2015-10-12 DIAGNOSIS — J4540 Moderate persistent asthma, uncomplicated: Secondary | ICD-10-CM | POA: Diagnosis not present

## 2015-10-19 DIAGNOSIS — J309 Allergic rhinitis, unspecified: Secondary | ICD-10-CM | POA: Diagnosis not present

## 2015-10-19 DIAGNOSIS — J4540 Moderate persistent asthma, uncomplicated: Secondary | ICD-10-CM | POA: Diagnosis not present

## 2015-10-21 DIAGNOSIS — H25813 Combined forms of age-related cataract, bilateral: Secondary | ICD-10-CM | POA: Diagnosis not present

## 2015-10-21 DIAGNOSIS — Z794 Long term (current) use of insulin: Secondary | ICD-10-CM | POA: Diagnosis not present

## 2015-10-21 DIAGNOSIS — E119 Type 2 diabetes mellitus without complications: Secondary | ICD-10-CM | POA: Diagnosis not present

## 2015-10-21 DIAGNOSIS — H40023 Open angle with borderline findings, high risk, bilateral: Secondary | ICD-10-CM | POA: Diagnosis not present

## 2015-10-24 DIAGNOSIS — I10 Essential (primary) hypertension: Secondary | ICD-10-CM | POA: Diagnosis not present

## 2015-10-24 DIAGNOSIS — R0609 Other forms of dyspnea: Secondary | ICD-10-CM | POA: Diagnosis not present

## 2015-10-24 DIAGNOSIS — E119 Type 2 diabetes mellitus without complications: Secondary | ICD-10-CM | POA: Diagnosis not present

## 2015-10-24 DIAGNOSIS — I872 Venous insufficiency (chronic) (peripheral): Secondary | ICD-10-CM | POA: Diagnosis not present

## 2015-10-25 DIAGNOSIS — J4540 Moderate persistent asthma, uncomplicated: Secondary | ICD-10-CM | POA: Diagnosis not present

## 2015-10-25 DIAGNOSIS — R05 Cough: Secondary | ICD-10-CM | POA: Diagnosis not present

## 2015-10-31 ENCOUNTER — Other Ambulatory Visit: Payer: Self-pay | Admitting: "Endocrinology

## 2015-11-07 DIAGNOSIS — Z23 Encounter for immunization: Secondary | ICD-10-CM | POA: Diagnosis not present

## 2015-11-09 DIAGNOSIS — R05 Cough: Secondary | ICD-10-CM | POA: Diagnosis not present

## 2015-11-09 DIAGNOSIS — J4540 Moderate persistent asthma, uncomplicated: Secondary | ICD-10-CM | POA: Diagnosis not present

## 2015-11-16 DIAGNOSIS — Z794 Long term (current) use of insulin: Secondary | ICD-10-CM | POA: Diagnosis not present

## 2015-11-16 DIAGNOSIS — E1122 Type 2 diabetes mellitus with diabetic chronic kidney disease: Secondary | ICD-10-CM | POA: Diagnosis not present

## 2015-11-16 DIAGNOSIS — E1165 Type 2 diabetes mellitus with hyperglycemia: Secondary | ICD-10-CM | POA: Diagnosis not present

## 2015-11-16 DIAGNOSIS — N183 Chronic kidney disease, stage 3 (moderate): Secondary | ICD-10-CM | POA: Diagnosis not present

## 2015-11-17 LAB — CMP14+EGFR
ALBUMIN: 4.3 g/dL (ref 3.6–4.8)
ALT: 12 IU/L (ref 0–44)
AST: 15 IU/L (ref 0–40)
Albumin/Globulin Ratio: 1.5 (ref 1.2–2.2)
Alkaline Phosphatase: 57 IU/L (ref 39–117)
BUN / CREAT RATIO: 15 (ref 10–24)
BUN: 24 mg/dL (ref 8–27)
Bilirubin Total: 0.7 mg/dL (ref 0.0–1.2)
CALCIUM: 9.5 mg/dL (ref 8.6–10.2)
CO2: 25 mmol/L (ref 18–29)
CREATININE: 1.6 mg/dL — AB (ref 0.76–1.27)
Chloride: 100 mmol/L (ref 96–106)
GFR, EST AFRICAN AMERICAN: 51 mL/min/{1.73_m2} — AB (ref >59)
GFR, EST NON AFRICAN AMERICAN: 45 mL/min/{1.73_m2} — AB (ref >59)
GLUCOSE: 245 mg/dL — AB (ref 65–99)
Globulin, Total: 2.9 g/dL (ref 1.5–4.5)
Potassium: 4.8 mmol/L (ref 3.5–5.2)
Sodium: 141 mmol/L (ref 134–144)
TOTAL PROTEIN: 7.2 g/dL (ref 6.0–8.5)

## 2015-11-17 LAB — HEMOGLOBIN A1C
ESTIMATED AVERAGE GLUCOSE: 212 mg/dL
Hgb A1c MFr Bld: 9 % — ABNORMAL HIGH (ref 4.8–5.6)

## 2015-11-22 DIAGNOSIS — E119 Type 2 diabetes mellitus without complications: Secondary | ICD-10-CM | POA: Diagnosis not present

## 2015-11-22 DIAGNOSIS — N183 Chronic kidney disease, stage 3 (moderate): Secondary | ICD-10-CM | POA: Diagnosis not present

## 2015-11-22 DIAGNOSIS — I10 Essential (primary) hypertension: Secondary | ICD-10-CM | POA: Diagnosis not present

## 2015-11-23 ENCOUNTER — Ambulatory Visit: Payer: Medicare Other | Admitting: "Endocrinology

## 2015-11-23 ENCOUNTER — Ambulatory Visit: Payer: Medicare Other | Admitting: Nutrition

## 2015-11-23 DIAGNOSIS — R05 Cough: Secondary | ICD-10-CM | POA: Diagnosis not present

## 2015-11-23 DIAGNOSIS — J4540 Moderate persistent asthma, uncomplicated: Secondary | ICD-10-CM | POA: Diagnosis not present

## 2015-11-24 ENCOUNTER — Encounter: Payer: Self-pay | Admitting: "Endocrinology

## 2015-11-24 ENCOUNTER — Encounter: Payer: Medicare Other | Attending: "Endocrinology | Admitting: Nutrition

## 2015-11-24 ENCOUNTER — Ambulatory Visit (INDEPENDENT_AMBULATORY_CARE_PROVIDER_SITE_OTHER): Payer: Medicare Other | Admitting: "Endocrinology

## 2015-11-24 VITALS — BP 151/84 | HR 78 | Resp 18 | Ht 71.0 in | Wt 238.0 lb

## 2015-11-24 VITALS — Ht 71.0 in | Wt 238.0 lb

## 2015-11-24 DIAGNOSIS — E118 Type 2 diabetes mellitus with unspecified complications: Secondary | ICD-10-CM

## 2015-11-24 DIAGNOSIS — Z713 Dietary counseling and surveillance: Secondary | ICD-10-CM | POA: Insufficient documentation

## 2015-11-24 DIAGNOSIS — E119 Type 2 diabetes mellitus without complications: Secondary | ICD-10-CM | POA: Insufficient documentation

## 2015-11-24 DIAGNOSIS — I10 Essential (primary) hypertension: Secondary | ICD-10-CM | POA: Diagnosis not present

## 2015-11-24 DIAGNOSIS — E1165 Type 2 diabetes mellitus with hyperglycemia: Secondary | ICD-10-CM

## 2015-11-24 DIAGNOSIS — Z794 Long term (current) use of insulin: Secondary | ICD-10-CM | POA: Diagnosis not present

## 2015-11-24 DIAGNOSIS — E1122 Type 2 diabetes mellitus with diabetic chronic kidney disease: Secondary | ICD-10-CM | POA: Diagnosis not present

## 2015-11-24 DIAGNOSIS — IMO0002 Reserved for concepts with insufficient information to code with codable children: Secondary | ICD-10-CM

## 2015-11-24 DIAGNOSIS — E782 Mixed hyperlipidemia: Secondary | ICD-10-CM | POA: Diagnosis not present

## 2015-11-24 DIAGNOSIS — N183 Chronic kidney disease, stage 3 (moderate): Secondary | ICD-10-CM | POA: Diagnosis not present

## 2015-11-24 NOTE — Progress Notes (Signed)
  Medical Nutrition Therapy:  Appt start time: 1600 end time:  1630.  Assessment:  Primary concerns today: Diabetes Type 2.  Here with his wife.  Meter brought in. A1C up to 9%.  His wife went back to teach at school and he is at home not eating much often. Lost 4 lbs. Been snacking after supper. Lantus up to 60 units a day now . Metformin 500 mg BID Saw Dr. Dorris Fetch  Feels tired and not much energy due to elevated blood sugars.  Wt Readings from Last 3 Encounters:  11/24/15 238 lb (108 kg)  08/15/15 242 lb (109.8 kg)  08/15/15 242 lb (109.8 kg)   Ht Readings from Last 3 Encounters:  11/24/15 5\' 11"  (1.803 m)  08/15/15 5\' 11"  (1.803 m)  05/25/15 5\' 11"  (1.803 m)        Lab Results  Component Value Date   HGBA1C 9.0 (H) 11/16/2015       Preferred Learning Style:    Auditory  Visual  Hands on  No preference indicated    Learning Readiness:     Ready  Change in progress   MEDICATIONS: See   DIETARY INTAKE:  Phyal physical activity: Golf B) 2 eggs and 1 slice toast, tomatoes, and 2 sausage  Patties Lunch skips often Eats dinner balanced meals . Likes to snack on skinny girl popcorn at night. May eat carrots between meals at times.  Estimated energy needs: 1800 calories 200 g carbohydrates 135 g protein 50 g fat  Progress Towards Goal(s):  In progress.   Nutritional Diagnosis:  NB-1.1 Food and nutrition-related knowledge deficit As related to Diabetes.  As evidenced by A1C 10.1%.    Intervention:  Nutrition and Diabetes education provided on My Plate, CHO counting, meal planning, portion sizes, timing of meals, avoiding snacks between meals unless having a low blood sugar, target ranges for A1C and blood sugars, signs/symptoms and treatment of hyper/hypoglycemia, monitoring blood sugars, taking medications as prescribed, benefits of exercising 30 minutes per day and prevention of complications of DM. Reviewed meal planning, and feeling of weakness or fatigue as  his body gets use to lower normal blood sugars. Treatment of low blood sugars.    Goals 1. Don't skip meals 2. Eat 45-60 grams of carbs per meal 3. Eat meals on time 4. Make sure 2-3 low carb veggies with meals Drink water Take meds as prescribed  Teaching Method Utilized:  Visual Auditory Hands on  Handouts given during visit include:  The Plate Method  Meal Plan Card  Diabetes Instructions  Barriers to learning/adherence to lifestyle change:  None  Demonstrated degree of understanding via:  Teach Back   Monitoring/Evaluation:  Dietary intake, exercise, meal planning, SBG, and body weight in 3  month(s).

## 2015-11-24 NOTE — Progress Notes (Signed)
Subjective:    Patient ID: Allen [name], male    DOB: October 08, 1950. Patient is being seen in f/u for management of diabetes requested by  [facility], MD  Past Medical History:  Diagnosis Date  . Diabetes mellitus, type II ([place])   . Hyperlipidemia   . Hypertension   . Kidney disease    Past Surgical History:  Procedure Laterality Date  . carpal tunnel    . CHOLECYSTECTOMY    . NASAL SINUS SURGERY     Social History   Social History  . Marital status: Married    Spouse name: N/A  . Number of children: N/A  . Years of education: N/A   Social History Main Topics  . Smoking status: Never Smoker  . Smokeless tobacco: None  . Alcohol use No  . Drug use: No  . Sexual activity: Not Asked   Other Topics Concern  . None   Social History Narrative  . None   Outpatient Encounter Prescriptions as of 11/24/2015  Medication Sig  . amLODipine (NORVASC) 10 MG tablet Take 10 mg by mouth daily.  Marland Kitchen atorvastatin (LIPITOR) 20 MG tablet Take 20 mg by mouth daily.  Marland Kitchen eplerenone (INSPRA) 50 MG tablet Take 50 mg by mouth daily.  . fenofibrate (TRICOR) 145 MG tablet Take 145 mg by mouth daily.  . Fluticasone-Salmeterol (ADVAIR DISKUS) 500-50 MCG/DOSE AEPB Inhale 1 puff into the lungs daily.  Marland Kitchen gabapentin (NEURONTIN) 300 MG capsule Take 300 mg by mouth at bedtime.  . Insulin Glargine (LANTUS SOLOSTAR) 100 UNIT/ML Solostar Pen Inject 60 Units into the skin daily at 10 pm.  . losartan (COZAAR) 100 MG tablet Take 100 mg by mouth daily.  . metFORMIN (GLUCOPHAGE) 500 MG tablet TAKE 1 TABLET (500 MG TOTAL) BY MOUTH 2 (TWO) TIMES DAILY WITH A MEAL.  . metoprolol (LOPRESSOR) 50 MG tablet Take 50 mg by mouth every morning.  . mometasone (NASONEX) 50 MCG/ACT nasal spray Place 2 sprays into the nose daily.  . montelukast (SINGULAIR) 10 MG tablet Take 10 mg by mouth at bedtime.  . Omega-3 Fatty Acids (FISH OIL) 1000 MG CAPS Take by mouth 2 (two) times daily.  . [DISCONTINUED] JANUVIA 25 MG tablet  TAKE 1 TABLET BY MOUTH EVERY DAY   No facility-administered encounter medications on file as of 11/24/2015.    ALLERGIES: No Known Allergies VACCINATION STATUS:  There is no immunization history on file for this patient.  Diabetes  He presents for his follow-up diabetic visit. He has type 2 diabetes mellitus. Onset time: He was diagnosed at approximate age of 59 years. His disease course has been worsening. There are no hypoglycemic associated symptoms. Pertinent negatives for hypoglycemia include no confusion, headaches, pallor or seizures. Pertinent negatives for diabetes include no chest pain, no fatigue, no polydipsia, no polyphagia, no polyuria and no weakness. There are no hypoglycemic complications. Symptoms are worsening. Diabetic complications include nephropathy. Risk factors for coronary artery disease include diabetes mellitus, dyslipidemia, hypertension, male sex, sedentary lifestyle and obesity. Current diabetic treatment includes insulin injections (He is on Lantus 78 units nightly and NovoLog 30-40 units 3 times a day before meals.). His weight is decreasing steadily. He is following a generally unhealthy diet. When asked about meal planning, he reported none. He has not had a previous visit with a dietitian. He participates in exercise intermittently (He participates in golfing activities.). His breakfast blood glucose range is generally >200 mg/dl. His overall blood glucose range is >200 mg/dl. An ACE inhibitor/angiotensin  II receptor blocker is being taken. Eye exam is current.  Hyperlipidemia  This is a chronic problem. The current episode started more than 1 year ago. The problem is uncontrolled. Exacerbating diseases include chronic renal disease, diabetes and obesity. Pertinent negatives include no chest pain, myalgias or shortness of breath. Current antihyperlipidemic treatment includes statins and bile acid squestrants. Compliance problems include adherence to diet.  Risk factors  for coronary artery disease include diabetes mellitus, dyslipidemia, hypertension, male sex, obesity and a sedentary lifestyle.  Hypertension  This is a chronic problem. The current episode started more than 1 year ago. The problem is uncontrolled. Pertinent negatives include no chest pain, headaches, neck pain, palpitations or shortness of breath. Risk factors for coronary artery disease include diabetes mellitus. Past treatments include angiotensin blockers. Hypertensive end-organ damage includes kidney disease. Identifiable causes of hypertension include chronic renal disease.     Review of Systems  Constitutional: Negative for chills, fatigue, fever and unexpected weight change.  HENT: Negative for dental problem, mouth sores and trouble swallowing.   Eyes: Negative for visual disturbance.  Respiratory: Negative for cough, choking, chest tightness, shortness of breath and wheezing.   Cardiovascular: Negative for chest pain, palpitations and leg swelling.  Gastrointestinal: Negative for abdominal distention, abdominal pain, constipation, diarrhea, nausea and vomiting.  Endocrine: Negative for polydipsia, polyphagia and polyuria.  Genitourinary: Negative for dysuria, flank pain, hematuria and urgency.  Musculoskeletal: Negative for back pain, gait problem, myalgias and neck pain.  Skin: Negative for pallor, rash and wound.  Neurological: Negative for seizures, syncope, weakness, numbness and headaches.  Psychiatric/Behavioral: Negative.  Negative for confusion and dysphoric mood.    Objective:    BP (!) 170/74   Pulse 78   Resp 18   Ht 5\' 11"  (1.803 m)   Wt 238 lb (108 kg)   SpO2 94%   BMI 33.19 kg/m   Wt Readings from Last 3 Encounters:  11/24/15 238 lb (108 kg)  08/15/15 242 lb (109.8 kg)  08/15/15 242 lb (109.8 kg)    Physical Exam  Constitutional: He is oriented to person, place, and time. He appears well-developed and well-nourished. He is cooperative. No distress.  HENT:   Head: Normocephalic and atraumatic.  Eyes: EOM are normal.  Neck: Normal range of motion. Neck supple. No tracheal deviation present. No thyromegaly present.  Cardiovascular: Normal rate, S1 normal, S2 normal and normal heart sounds.  Exam reveals no gallop.   No murmur heard. Pulses:      Dorsalis pedis pulses are 1+ on the right side, and 1+ on the left side.       Posterior tibial pulses are 1+ on the right side, and 1+ on the left side.  Pulmonary/Chest: Breath sounds normal. No respiratory distress. He has no wheezes.  Abdominal: Soft. Bowel sounds are normal. He exhibits no distension. There is no tenderness. There is no guarding and no CVA tenderness.  Musculoskeletal: He exhibits no edema.       Right shoulder: He exhibits no swelling and no deformity.  Neurological: He is alert and oriented to person, place, and time. He has normal strength and normal reflexes. No cranial nerve deficit or sensory deficit. Gait normal.  Skin: Skin is warm and dry. No rash noted. No cyanosis. Nails show no clubbing.  Psychiatric: He has a normal mood and affect. His speech is normal and behavior is normal. Judgment and thought content normal. Cognition and memory are normal.    Assessment & Plan:   1.  Uncontrolled type 2 diabetes mellitus with stage 3 chronic kidney disease, with long-term current use of insulin ([place]) - Patient has currently uncontrolled symptomatic type 2 DM since  65 years of age,  with last A1c of  9% increasing from 7.9%, after generally improved from 10.4%.  his diabetes is complicated by mild  CKD  and patient remains at a high risk for more acute and chronic complications of diabetes which include CAD, CVA, CKD, retinopathy, and neuropathy. These are all discussed in detail with the patient.  - I have counseled the patient on diet management and weight loss, by adopting a carbohydrate restricted/protein rich diet.  - Suggestion is made for patient to avoid simple  carbohydrates   from their diet including Cakes , Desserts, Ice Cream,  Soda (  diet and regular) , Sweet Tea , Candies,  Chips, Cookies, Artificial Sweeteners,   and "Sugar-free" Products . This will help patient to have stable blood glucose profile and potentially avoid unintended weight gain.  - I encouraged the patient to switch to  unprocessed or minimally processed complex starch and increased protein intake (animal or plant source), fruits, and vegetables.  - Patient is advised to stick to a routine mealtimes to eat 3 meals  a day and avoid unnecessary snacks ( to snack only to correct hypoglycemia).  - The patient will be scheduled with Jearld Fenton, RDN, CDE for individualized DM education.  - I have approached patient with the following individualized plan to manage diabetes and patient agrees:   - I  Will increase his basal insulin lantus to 60  units QHS, associated with strict monitoring of glucose  AC breakfast and at bedtime.  -Patient is encouraged to call clinic for blood glucose levels less than 70 or above 200 mg /dl. - He may need NovoLog or Januvia/Tradjenta if he cannot achieve glycemic control.  -His GFR is 45, I will continue low-dose metformin 500 mg by mouth twice a day.  -Reportedly, he did not tolerate Trulicity .  - Patient specific target  A1c;  LDL, HDL, Triglycerides, and  Waist Circumference were discussed in detail.  2) BP/HTN: uncontrolled. Continue current medications including ACEI/ARB. 3) Lipids/HPL:  Uncontrolled, triglycerides 267 LDL is 74  continue statins, TriCor and , omega-3 fatty acids.  4)  Weight/Diet: CDE Consult will be initiated , exercise, and detailed carbohydrates information provided.  5) Chronic Care/Health Maintenance:  -Patient is on ACEI/ARB and Statin medications and encouraged to continue to follow up with Ophthalmology, Podiatrist at least yearly or according to recommendations, and advised to   stay away from smoking. I have  recommended yearly flu vaccine and pneumonia vaccination at least every 5 years; moderate intensity exercise for up to 150 minutes weekly; and  sleep for at least 7 hours a day.  - 25 minutes of time was spent on the care of this patient , 50% of which was applied for counseling on diabetes complications and their preventions. - I advised patient to maintain close follow up with [facility], MD for primary care needs.  Follow up plan: - Return in about 3 months (around 02/24/2016) for follow up with pre-visit labs, meter, and logs.  Glade Lloyd, MD Phone: [phone]  Fax: [phone]   11/24/2015, 3:52 PM

## 2015-11-24 NOTE — Patient Instructions (Signed)
Goals 1. Don't skip meals 2. Eat 45-60 grams of carbs per meal 3. Eat meals on time 4. Make sure 2-3 low carb veggies with meals Drink water Take meds as prescribed

## 2015-11-24 NOTE — Patient Instructions (Signed)

## 2015-11-25 DIAGNOSIS — N183 Chronic kidney disease, stage 3 (moderate): Secondary | ICD-10-CM | POA: Diagnosis not present

## 2015-11-25 DIAGNOSIS — R05 Cough: Secondary | ICD-10-CM | POA: Diagnosis not present

## 2015-11-25 DIAGNOSIS — E1140 Type 2 diabetes mellitus with diabetic neuropathy, unspecified: Secondary | ICD-10-CM | POA: Diagnosis not present

## 2015-11-25 DIAGNOSIS — Z23 Encounter for immunization: Secondary | ICD-10-CM | POA: Diagnosis not present

## 2015-11-25 DIAGNOSIS — H6690 Otitis media, unspecified, unspecified ear: Secondary | ICD-10-CM | POA: Diagnosis not present

## 2015-11-25 DIAGNOSIS — I10 Essential (primary) hypertension: Secondary | ICD-10-CM | POA: Diagnosis not present

## 2015-12-06 DIAGNOSIS — R05 Cough: Secondary | ICD-10-CM | POA: Diagnosis not present

## 2015-12-06 DIAGNOSIS — J4540 Moderate persistent asthma, uncomplicated: Secondary | ICD-10-CM | POA: Diagnosis not present

## 2015-12-20 DIAGNOSIS — J4540 Moderate persistent asthma, uncomplicated: Secondary | ICD-10-CM | POA: Diagnosis not present

## 2015-12-20 DIAGNOSIS — R05 Cough: Secondary | ICD-10-CM | POA: Diagnosis not present

## 2015-12-26 ENCOUNTER — Telehealth: Payer: Self-pay

## 2015-12-26 MED ORDER — INSULIN DEGLUDEC 100 UNIT/ML ~~LOC~~ SOPN: 70.0000 [IU] | PEN_INJECTOR | Freq: Every day | SUBCUTANEOUS | 2 refills | Status: DC

## 2015-12-26 NOTE — Telephone Encounter (Signed)
Pt states he needed a prescription for Tresiba. I did not see this in the chart?

## 2015-12-26 NOTE — Telephone Encounter (Signed)
Rx Sent  

## 2015-12-26 NOTE — Telephone Encounter (Signed)
Correct, he was on Lantus at least on his last 2 visits with [name]. If his insurance is switching him to [place] ,  it is okay to switch him with the same dose of 70 units daily at bedtime.

## 2015-12-27 ENCOUNTER — Telehealth: Payer: Self-pay

## 2015-12-27 DIAGNOSIS — D492 Neoplasm of unspecified behavior of bone, soft tissue, and skin: Secondary | ICD-10-CM | POA: Diagnosis not present

## 2015-12-27 DIAGNOSIS — L570 Actinic keratosis: Secondary | ICD-10-CM | POA: Diagnosis not present

## 2015-12-27 NOTE — Telephone Encounter (Signed)
Pt states he has had high BG readings.   Date Before breakfast Before lunch Before supper Bedtime  11/30 241   334  12/1 255   404  12/2 274   330  12/3 290   291    Pt taking:  Lantus 60 units qhs, Metformin 500 mg bid.  tresiba was sent in but not covered by insurance.

## 2015-12-27 NOTE — Telephone Encounter (Signed)
Increase Lantus to 70 units daily at bedtime, continue metformin, start monitoring 4 times a day before meals and at bedtime and schedule a visit in 1 week.

## 2015-12-28 NOTE — Telephone Encounter (Signed)
Pt.notified

## 2015-12-29 ENCOUNTER — Other Ambulatory Visit: Payer: Self-pay

## 2015-12-29 MED ORDER — INSULIN GLARGINE 100 UNIT/ML SOLOSTAR PEN: 70.0000 [IU] | PEN_INJECTOR | Freq: Every day | SUBCUTANEOUS | 2 refills | Status: DC

## 2016-01-03 DIAGNOSIS — J4540 Moderate persistent asthma, uncomplicated: Secondary | ICD-10-CM | POA: Diagnosis not present

## 2016-01-03 DIAGNOSIS — R05 Cough: Secondary | ICD-10-CM | POA: Diagnosis not present

## 2016-01-04 DIAGNOSIS — J45909 Unspecified asthma, uncomplicated: Secondary | ICD-10-CM | POA: Diagnosis not present

## 2016-01-04 DIAGNOSIS — G4733 Obstructive sleep apnea (adult) (pediatric): Secondary | ICD-10-CM | POA: Diagnosis not present

## 2016-01-04 DIAGNOSIS — J329 Chronic sinusitis, unspecified: Secondary | ICD-10-CM | POA: Diagnosis not present

## 2016-01-04 DIAGNOSIS — R05 Cough: Secondary | ICD-10-CM | POA: Diagnosis not present

## 2016-01-05 ENCOUNTER — Encounter: Payer: Self-pay | Admitting: "Endocrinology

## 2016-01-05 ENCOUNTER — Ambulatory Visit (INDEPENDENT_AMBULATORY_CARE_PROVIDER_SITE_OTHER): Payer: Medicare Other | Admitting: "Endocrinology

## 2016-01-05 VITALS — BP 148/81 | HR 60 | Ht 71.0 in | Wt 240.0 lb

## 2016-01-05 DIAGNOSIS — E1165 Type 2 diabetes mellitus with hyperglycemia: Secondary | ICD-10-CM | POA: Diagnosis not present

## 2016-01-05 DIAGNOSIS — N183 Chronic kidney disease, stage 3 (moderate): Secondary | ICD-10-CM

## 2016-01-05 DIAGNOSIS — I10 Essential (primary) hypertension: Secondary | ICD-10-CM | POA: Diagnosis not present

## 2016-01-05 DIAGNOSIS — E782 Mixed hyperlipidemia: Secondary | ICD-10-CM

## 2016-01-05 DIAGNOSIS — E1122 Type 2 diabetes mellitus with diabetic chronic kidney disease: Secondary | ICD-10-CM | POA: Diagnosis not present

## 2016-01-05 DIAGNOSIS — IMO0002 Reserved for concepts with insufficient information to code with codable children: Secondary | ICD-10-CM

## 2016-01-05 DIAGNOSIS — Z794 Long term (current) use of insulin: Secondary | ICD-10-CM | POA: Diagnosis not present

## 2016-01-05 MED ORDER — INSULIN ASPART 100 UNIT/ML FLEXPEN: 10.0000 [IU] | PEN_INJECTOR | Freq: Three times a day (TID) | SUBCUTANEOUS | 2 refills | Status: DC

## 2016-01-05 NOTE — Progress Notes (Signed)
Subjective:    Patient ID: Allen [name], male    DOB: September 18, 1950. Patient is being seen in f/u for management of diabetes requested by  [facility], MD  Past Medical History:  Diagnosis Date  . Diabetes mellitus, type II ([place])   . Hyperlipidemia   . Hypertension   . Kidney disease    Past Surgical History:  Procedure Laterality Date  . carpal tunnel    . CHOLECYSTECTOMY    . NASAL SINUS SURGERY     Social History   Social History  . Marital status: Married    Spouse name: N/A  . Number of children: N/A  . Years of education: N/A   Social History Main Topics  . Smoking status: Never Smoker  . Smokeless tobacco: Never Used  . Alcohol use No  . Drug use: No  . Sexual activity: Not Asked   Other Topics Concern  . None   Social History Narrative  . None   Outpatient Encounter Prescriptions as of 01/05/2016  Medication Sig  . amLODipine (NORVASC) 10 MG tablet Take 10 mg by mouth daily.  Marland Kitchen atorvastatin (LIPITOR) 20 MG tablet Take 20 mg by mouth daily.  Marland Kitchen eplerenone (INSPRA) 50 MG tablet Take 50 mg by mouth daily.  . fenofibrate (TRICOR) 145 MG tablet Take 145 mg by mouth daily.  . Fluticasone-Salmeterol (ADVAIR DISKUS) 500-50 MCG/DOSE AEPB Inhale 1 puff into the lungs daily.  Marland Kitchen gabapentin (NEURONTIN) 300 MG capsule Take 300 mg by mouth at bedtime.  . insulin aspart (NOVOLOG FLEXPEN) 100 UNIT/ML FlexPen Inject 10-16 Units into the skin 3 (three) times daily with meals.  . Insulin Glargine (LANTUS SOLOSTAR) 100 UNIT/ML Solostar Pen Inject 70 Units into the skin at bedtime.  Marland Kitchen losartan (COZAAR) 100 MG tablet Take 100 mg by mouth daily.  . metFORMIN (GLUCOPHAGE) 500 MG tablet TAKE 1 TABLET (500 MG TOTAL) BY MOUTH 2 (TWO) TIMES DAILY WITH A MEAL.  . metoprolol (LOPRESSOR) 50 MG tablet Take 50 mg by mouth every morning.  . mometasone (NASONEX) 50 MCG/ACT nasal spray Place 2 sprays into the nose daily.  . montelukast (SINGULAIR) 10 MG tablet Take 10 mg by mouth at  bedtime.  . Omega-3 Fatty Acids (FISH OIL) 1000 MG CAPS Take by mouth 2 (two) times daily.  . [DISCONTINUED] insulin degludec (TRESIBA FLEXTOUCH) 100 UNIT/ML SOPN FlexTouch Pen Inject 0.7 mLs (70 Units total) into the skin at bedtime.   No facility-administered encounter medications on file as of 01/05/2016.    ALLERGIES: No Known Allergies VACCINATION STATUS:  There is no immunization history on file for this patient.  Diabetes  He presents for his follow-up diabetic visit. He has type 2 diabetes mellitus. Onset time: He was diagnosed at approximate age of 58 years. His disease course has been worsening. There are no hypoglycemic associated symptoms. Pertinent negatives for hypoglycemia include no confusion, headaches, pallor or seizures. Pertinent negatives for diabetes include no chest pain, no fatigue, no polydipsia, no polyphagia, no polyuria and no weakness. There are no hypoglycemic complications. Symptoms are worsening. Diabetic complications include nephropathy. Risk factors for coronary artery disease include diabetes mellitus, dyslipidemia, hypertension, male sex, sedentary lifestyle and obesity. Current diabetic treatment includes insulin injections (He is on Lantus 78 units nightly and NovoLog 30-40 units 3 times a day before meals.). His weight is decreasing steadily. He is following a generally unhealthy diet. When asked about meal planning, he reported none. He has not had a previous visit with a dietitian. He  participates in exercise intermittently (He participates in golfing activities.). His breakfast blood glucose range is generally >200 mg/dl. His overall blood glucose range is >200 mg/dl. An ACE inhibitor/angiotensin II receptor blocker is being taken. Eye exam is current.  Hyperlipidemia  This is a chronic problem. The current episode started more than 1 year ago. The problem is uncontrolled. Exacerbating diseases include chronic renal disease, diabetes and obesity. Pertinent  negatives include no chest pain, myalgias or shortness of breath. Current antihyperlipidemic treatment includes statins and bile acid squestrants. Compliance problems include adherence to diet.  Risk factors for coronary artery disease include diabetes mellitus, dyslipidemia, hypertension, male sex, obesity and a sedentary lifestyle.  Hypertension  This is a chronic problem. The current episode started more than 1 year ago. The problem is uncontrolled. Pertinent negatives include no chest pain, headaches, neck pain, palpitations or shortness of breath. Risk factors for coronary artery disease include diabetes mellitus. Past treatments include angiotensin blockers. Hypertensive end-organ damage includes kidney disease. Identifiable causes of hypertension include chronic renal disease.     Review of Systems  Constitutional: Negative for chills, fatigue, fever and unexpected weight change.  HENT: Negative for dental problem, mouth sores and trouble swallowing.   Eyes: Negative for visual disturbance.  Respiratory: Negative for cough, choking, chest tightness, shortness of breath and wheezing.   Cardiovascular: Negative for chest pain, palpitations and leg swelling.  Gastrointestinal: Negative for abdominal distention, abdominal pain, constipation, diarrhea, nausea and vomiting.  Endocrine: Negative for polydipsia, polyphagia and polyuria.  Genitourinary: Negative for dysuria, flank pain, hematuria and urgency.  Musculoskeletal: Negative for back pain, gait problem, myalgias and neck pain.  Skin: Negative for pallor, rash and wound.  Neurological: Negative for seizures, syncope, weakness, numbness and headaches.  Psychiatric/Behavioral: Negative.  Negative for confusion and dysphoric mood.    Objective:    BP (!) 148/81   Pulse 60   Ht 5\' 11"  (1.803 m)   Wt 240 lb (108.9 kg)   BMI 33.47 kg/m   Wt Readings from Last 3 Encounters:  01/05/16 240 lb (108.9 kg)  11/24/15 238 lb (108 kg)   11/24/15 238 lb (108 kg)    Physical Exam  Constitutional: He is oriented to person, place, and time. He appears well-developed and well-nourished. He is cooperative. No distress.  HENT:  Head: Normocephalic and atraumatic.  Eyes: EOM are normal.  Neck: Normal range of motion. Neck supple. No tracheal deviation present. No thyromegaly present.  Cardiovascular: Normal rate, S1 normal, S2 normal and normal heart sounds.  Exam reveals no gallop.   No murmur heard. Pulses:      Dorsalis pedis pulses are 1+ on the right side, and 1+ on the left side.       Posterior tibial pulses are 1+ on the right side, and 1+ on the left side.  Pulmonary/Chest: Breath sounds normal. No respiratory distress. He has no wheezes.  Abdominal: Soft. Bowel sounds are normal. He exhibits no distension. There is no tenderness. There is no guarding and no CVA tenderness.  Musculoskeletal: He exhibits no edema.       Right shoulder: He exhibits no swelling and no deformity.  Neurological: He is alert and oriented to person, place, and time. He has normal strength and normal reflexes. No cranial nerve deficit or sensory deficit. Gait normal.  Skin: Skin is warm and dry. No rash noted. No cyanosis. Nails show no clubbing.  Psychiatric: He has a normal mood and affect. His speech is normal and behavior  is normal. Judgment and thought content normal. Cognition and memory are normal.    Assessment & Plan:   1. Uncontrolled type 2 diabetes mellitus with stage 3 chronic kidney disease, with long-term current use of insulin ([place]) - Patient has currently uncontrolled symptomatic type 2 DM since  65 years of age,  with last A1c of  9% increasing from 7.9%, after generally improved from 10.4%.  his diabetes is complicated by mild  CKD  and patient remains at a high risk for more acute and chronic complications of diabetes which include CAD, CVA, CKD, retinopathy, and neuropathy. These are all discussed in detail with the  patient.  - I have counseled the patient on diet management and weight loss, by adopting a carbohydrate restricted/protein rich diet.  - Suggestion is made for patient to avoid simple carbohydrates   from their diet including Cakes , Desserts, Ice Cream,  Soda (  diet and regular) , Sweet Tea , Candies,  Chips, Cookies, Artificial Sweeteners,   and "Sugar-free" Products . This will help patient to have stable blood glucose profile and potentially avoid unintended weight gain.  - I encouraged the patient to switch to  unprocessed or minimally processed complex starch and increased protein intake (animal or plant source), fruits, and vegetables.  - Patient is advised to stick to a routine mealtimes to eat 3 meals  a day and avoid unnecessary snacks ( to snack only to correct hypoglycemia).  - The patient will be scheduled with Jearld Fenton, RDN, CDE for individualized DM education.  - I have approached patient with the following individualized plan to manage diabetes and patient agrees:   - He came with significantly above target blood glucose profile measuring before meals and at bedtime for the last week averaging 220.  This is despite maximizing his Lantus to 70 units daily at bedtime. - I  Will increase his basal insulin lantus to 80  units QHS, associated with strict monitoring of glucose daily before meals and at bedtime. -I would initiate NovoLog 10 units 3 times a day before meals. -Patient is encouraged to call clinic for blood glucose levels less than 70 or above 200 mg /dl.  -His GFR is 45, I will continue low-dose metformin 500 mg by mouth twice a day.  -Reportedly, he did not tolerate Trulicity .  - Patient specific target  A1c;  LDL, HDL, Triglycerides, and  Waist Circumference were discussed in detail.  2) BP/HTN: uncontrolled. Continue current medications including ACEI/ARB. 3) Lipids/HPL:  Uncontrolled, triglycerides 267 LDL is 74  continue statins, TriCor and , omega-3  fatty acids.  4)  Weight/Diet: CDE Consult will be initiated , exercise, and detailed carbohydrates information provided. I gave him a brochure any formation on bariatric surgery. I believe he would benefit from this procedure from a number of comorbidity point of view including type 2 diabetes, sleep apnea, hypertension, hyperlipidemia.  5) Chronic Care/Health Maintenance:  -Patient is on ACEI/ARB and Statin medications and encouraged to continue to follow up with Ophthalmology, Podiatrist at least yearly or according to recommendations, and advised to   stay away from smoking. I have recommended yearly flu vaccine and pneumonia vaccination at least every 5 years; moderate intensity exercise for up to 150 minutes weekly; and  sleep for at least 7 hours a day.  - 25 minutes of time was spent on the care of this patient , 50% of which was applied for counseling on diabetes complications and their preventions. - I advised  patient to maintain close follow up with [facility], MD for primary care needs.  Follow up plan: - Return in about 3 weeks (around 01/26/2016) for follow up with meter and logs- no labs.  Glade Lloyd, MD Phone: [phone]  Fax: [phone]   01/05/2016, 2:16 PM

## 2016-01-05 NOTE — Patient Instructions (Signed)

## 2016-01-06 ENCOUNTER — Other Ambulatory Visit: Payer: Self-pay | Admitting: "Endocrinology

## 2016-01-12 DIAGNOSIS — H7291 Unspecified perforation of tympanic membrane, right ear: Secondary | ICD-10-CM | POA: Diagnosis not present

## 2016-01-12 DIAGNOSIS — H6691 Otitis media, unspecified, right ear: Secondary | ICD-10-CM | POA: Diagnosis not present

## 2016-01-18 ENCOUNTER — Other Ambulatory Visit (HOSPITAL_BASED_OUTPATIENT_CLINIC_OR_DEPARTMENT_OTHER): Payer: Self-pay

## 2016-01-18 DIAGNOSIS — G4730 Sleep apnea, unspecified: Secondary | ICD-10-CM

## 2016-01-24 DIAGNOSIS — M65342 Trigger finger, left ring finger: Secondary | ICD-10-CM | POA: Diagnosis not present

## 2016-01-26 DIAGNOSIS — J4540 Moderate persistent asthma, uncomplicated: Secondary | ICD-10-CM | POA: Diagnosis not present

## 2016-01-26 DIAGNOSIS — R05 Cough: Secondary | ICD-10-CM | POA: Diagnosis not present

## 2016-01-27 ENCOUNTER — Encounter: Payer: Self-pay | Admitting: "Endocrinology

## 2016-01-27 ENCOUNTER — Ambulatory Visit (INDEPENDENT_AMBULATORY_CARE_PROVIDER_SITE_OTHER): Payer: Medicare Other | Admitting: "Endocrinology

## 2016-01-27 VITALS — BP 148/85 | HR 62 | Ht 71.0 in | Wt 239.0 lb

## 2016-01-27 DIAGNOSIS — N183 Chronic kidney disease, stage 3 (moderate): Secondary | ICD-10-CM

## 2016-01-27 DIAGNOSIS — Z794 Long term (current) use of insulin: Secondary | ICD-10-CM

## 2016-01-27 DIAGNOSIS — E1122 Type 2 diabetes mellitus with diabetic chronic kidney disease: Secondary | ICD-10-CM | POA: Diagnosis not present

## 2016-01-27 DIAGNOSIS — E1165 Type 2 diabetes mellitus with hyperglycemia: Secondary | ICD-10-CM | POA: Diagnosis not present

## 2016-01-27 DIAGNOSIS — E782 Mixed hyperlipidemia: Secondary | ICD-10-CM | POA: Diagnosis not present

## 2016-01-27 DIAGNOSIS — I10 Essential (primary) hypertension: Secondary | ICD-10-CM

## 2016-01-27 DIAGNOSIS — IMO0002 Reserved for concepts with insufficient information to code with codable children: Secondary | ICD-10-CM

## 2016-01-27 MED ORDER — INSULIN GLARGINE 100 UNIT/ML SOLOSTAR PEN: 80.0000 [IU] | PEN_INJECTOR | Freq: Every day | SUBCUTANEOUS | 2 refills | Status: DC

## 2016-01-27 MED ORDER — INSULIN ASPART 100 UNIT/ML FLEXPEN: 15.0000 [IU] | PEN_INJECTOR | Freq: Three times a day (TID) | SUBCUTANEOUS | 2 refills | Status: DC

## 2016-01-27 NOTE — Progress Notes (Signed)
Subjective:    Patient ID: Allen [name], male    DOB: January 12, 1951. Patient is being seen in f/u for management of diabetes requested by  [facility], MD  Past Medical History:  Diagnosis Date  . Diabetes mellitus, type II ([place])   . Hyperlipidemia   . Hypertension   . Kidney disease    Past Surgical History:  Procedure Laterality Date  . carpal tunnel    . CHOLECYSTECTOMY    . NASAL SINUS SURGERY     Social History   Social History  . Marital status: Married    Spouse name: N/A  . Number of children: N/A  . Years of education: N/A   Social History Main Topics  . Smoking status: Never Smoker  . Smokeless tobacco: Never Used  . Alcohol use No  . Drug use: No  . Sexual activity: Not Asked   Other Topics Concern  . None   Social History Narrative  . None   Outpatient Encounter Prescriptions as of 01/27/2016  Medication Sig  . amLODipine (NORVASC) 10 MG tablet Take 10 mg by mouth daily.  Marland Kitchen atorvastatin (LIPITOR) 20 MG tablet Take 20 mg by mouth daily.  Marland Kitchen eplerenone (INSPRA) 50 MG tablet Take 50 mg by mouth daily.  . fenofibrate (TRICOR) 145 MG tablet Take 145 mg by mouth daily.  . Fluticasone-Salmeterol (ADVAIR DISKUS) 500-50 MCG/DOSE AEPB Inhale 1 puff into the lungs daily.  Marland Kitchen gabapentin (NEURONTIN) 300 MG capsule Take 300 mg by mouth at bedtime.  . insulin aspart (NOVOLOG FLEXPEN) 100 UNIT/ML FlexPen Inject 15-21 Units into the skin 3 (three) times daily with meals.  . Insulin Glargine (LANTUS SOLOSTAR) 100 UNIT/ML Solostar Pen Inject 80 Units into the skin at bedtime.  Marland Kitchen losartan (COZAAR) 100 MG tablet Take 100 mg by mouth daily.  . metFORMIN (GLUCOPHAGE) 500 MG tablet TAKE 1 TABLET (500 MG TOTAL) BY MOUTH 2 (TWO) TIMES DAILY WITH A MEAL.  . metoprolol (LOPRESSOR) 50 MG tablet Take 50 mg by mouth every morning.  . mometasone (NASONEX) 50 MCG/ACT nasal spray Place 2 sprays into the nose daily.  . montelukast (SINGULAIR) 10 MG tablet Take 10 mg by mouth at bedtime.   . Omega-3 Fatty Acids (FISH OIL) 1000 MG CAPS Take by mouth 2 (two) times daily.  . [DISCONTINUED] insulin aspart (NOVOLOG FLEXPEN) 100 UNIT/ML FlexPen Inject 10-16 Units into the skin 3 (three) times daily with meals.  . [DISCONTINUED] Insulin Glargine (LANTUS SOLOSTAR) 100 UNIT/ML Solostar Pen Inject 70 Units into the skin at bedtime. (Patient taking differently: Inject 80 Units into the skin at bedtime. )   No facility-administered encounter medications on file as of 01/27/2016.    ALLERGIES: No Known Allergies VACCINATION STATUS:  There is no immunization history on file for this patient.  Diabetes  He presents for his follow-up diabetic visit. He has type 2 diabetes mellitus. Onset time: He was diagnosed at approximate age of 20 years. His disease course has been worsening. There are no hypoglycemic associated symptoms. Pertinent negatives for hypoglycemia include no confusion, headaches, pallor or seizures. Pertinent negatives for diabetes include no chest pain, no fatigue, no polydipsia, no polyphagia, no polyuria and no weakness. There are no hypoglycemic complications. Symptoms are worsening. Diabetic complications include nephropathy. Risk factors for coronary artery disease include diabetes mellitus, dyslipidemia, hypertension, male sex, sedentary lifestyle and obesity. Current diabetic treatment includes insulin injections (He is on Lantus 78 units nightly and NovoLog 30-40 units 3 times a day before meals.). His  weight is decreasing steadily. He is following a generally unhealthy diet. When asked about meal planning, he reported none. He has not had a previous visit with a dietitian. He participates in exercise intermittently (He participates in golfing activities.). His breakfast blood glucose range is generally >200 mg/dl. His overall blood glucose range is >200 mg/dl. An ACE inhibitor/angiotensin II receptor blocker is being taken. Eye exam is current.  Hyperlipidemia  This is a  chronic problem. The current episode started more than 1 year ago. The problem is uncontrolled. Exacerbating diseases include chronic renal disease, diabetes and obesity. Pertinent negatives include no chest pain, myalgias or shortness of breath. Current antihyperlipidemic treatment includes statins and bile acid squestrants. Compliance problems include adherence to diet.  Risk factors for coronary artery disease include diabetes mellitus, dyslipidemia, hypertension, male sex, obesity and a sedentary lifestyle.  Hypertension  This is a chronic problem. The current episode started more than 1 year ago. The problem is uncontrolled. Pertinent negatives include no chest pain, headaches, neck pain, palpitations or shortness of breath. Risk factors for coronary artery disease include diabetes mellitus. Past treatments include angiotensin blockers. Hypertensive end-organ damage includes kidney disease. Identifiable causes of hypertension include chronic renal disease.     Review of Systems  Constitutional: Negative for chills, fatigue, fever and unexpected weight change.  HENT: Negative for dental problem, mouth sores and trouble swallowing.   Eyes: Negative for visual disturbance.  Respiratory: Negative for cough, choking, chest tightness, shortness of breath and wheezing.   Cardiovascular: Negative for chest pain, palpitations and leg swelling.  Gastrointestinal: Negative for abdominal distention, abdominal pain, constipation, diarrhea, nausea and vomiting.  Endocrine: Negative for polydipsia, polyphagia and polyuria.  Genitourinary: Negative for dysuria, flank pain, hematuria and urgency.  Musculoskeletal: Negative for back pain, gait problem, myalgias and neck pain.  Skin: Negative for pallor, rash and wound.  Neurological: Negative for seizures, syncope, weakness, numbness and headaches.  Psychiatric/Behavioral: Negative.  Negative for confusion and dysphoric mood.    Objective:    BP (!) 148/85    Pulse 62   Ht 5\' 11"  (1.803 m)   Wt 239 lb (108.4 kg)   BMI 33.33 kg/m   Wt Readings from Last 3 Encounters:  01/27/16 239 lb (108.4 kg)  01/05/16 240 lb (108.9 kg)  11/24/15 238 lb (108 kg)    Physical Exam  Constitutional: He is oriented to person, place, and time. He appears well-developed and well-nourished. He is cooperative. No distress.  HENT:  Head: Normocephalic and atraumatic.  Eyes: EOM are normal.  Neck: Normal range of motion. Neck supple. No tracheal deviation present. No thyromegaly present.  Cardiovascular: Normal rate, S1 normal, S2 normal and normal heart sounds.  Exam reveals no gallop.   No murmur heard. Pulses:      Dorsalis pedis pulses are 1+ on the right side, and 1+ on the left side.       Posterior tibial pulses are 1+ on the right side, and 1+ on the left side.  Pulmonary/Chest: Breath sounds normal. No respiratory distress. He has no wheezes.  Abdominal: Soft. Bowel sounds are normal. He exhibits no distension. There is no tenderness. There is no guarding and no CVA tenderness.  Musculoskeletal: He exhibits no edema.       Right shoulder: He exhibits no swelling and no deformity.  Neurological: He is alert and oriented to person, place, and time. He has normal strength and normal reflexes. No cranial nerve deficit or sensory deficit. Gait normal.  Skin: Skin is warm and dry. No rash noted. No cyanosis. Nails show no clubbing.  Psychiatric: He has a normal mood and affect. His speech is normal and behavior is normal. Judgment and thought content normal. Cognition and memory are normal.    Assessment & Plan:   1. Uncontrolled type 2 diabetes mellitus with stage 3 chronic kidney disease, with long-term current use of insulin ([place]) - Patient has currently uncontrolled symptomatic type 2 DM since  66 years of age,  with last A1c of  9% increasing from 7.9%, after generally improved from 10.4%.  his diabetes is complicated by mild  CKD  and patient remains  at a high risk for more acute and chronic complications of diabetes which include CAD, CVA, CKD, retinopathy, and neuropathy. These are all discussed in detail with the patient.  - I have counseled the patient on diet management and weight loss, by adopting a carbohydrate restricted/protein rich diet.  - Suggestion is made for patient to avoid simple carbohydrates   from their diet including Cakes , Desserts, Ice Cream,  Soda (  diet and regular) , Sweet Tea , Candies,  Chips, Cookies, Artificial Sweeteners,   and "Sugar-free" Products . This will help patient to have stable blood glucose profile and potentially avoid unintended weight gain.  - I encouraged the patient to switch to  unprocessed or minimally processed complex starch and increased protein intake (animal or plant source), fruits, and vegetables.  - Patient is advised to stick to a routine mealtimes to eat 3 meals  a day and avoid unnecessary snacks ( to snack only to correct hypoglycemia).  - The patient will be scheduled with Jearld Fenton, RDN, CDE for individualized DM education.  - I have approached patient with the following individualized plan to manage diabetes and patient agrees:   - He came with significantly above target blood glucose profile , despite large dose of insulin in the form of Lantus and NovoLog.  - I  Will continue Lantus at 80 units daily at bedtime, associated with strict monitoring of glucose daily before meals and at bedtime. -I  would increase NovoLog to 15 units  3 times a day before meals, plus correction for unexpected hyperglycemia above 150 g/dL.  -Patient is encouraged to call clinic for blood glucose levels less than 70 or above 200 mg /dl.  -His GFR is 45, I will continue low-dose metformin 500 mg by mouth twice a day.  -Reportedly, he did not tolerate Trulicity .  - Patient specific target  A1c;  LDL, HDL, Triglycerides, and  Waist Circumference were discussed in detail.  2) BP/HTN:  uncontrolled. Continue current medications including ACEI/ARB. 3) Lipids/HPL:  Uncontrolled, triglycerides 267 LDL is 74  continue statins, TriCor and , omega-3 fatty acids.   4)  Weight/Diet: CDE Consult will be initiated , exercise, and detailed carbohydrates information provided. I gave him a brochure and information on bariatric surgery. - He has started the evaluation process for bariatric surgery. In March 2017 he weighed 256 pounds with BMI of 35.7, he lost 17 pounds due to caloric restriction. However, his weight has stabilized at 240 pounds over the last 3 visits.   I believe he would benefit from this procedure from a number of comorbidity point of view including type 2 diabetes, sleep apnea, hypertension, hyperlipidemia.  5) Chronic Care/Health Maintenance:  -Patient is on ACEI/ARB and Statin medications and encouraged to continue to follow up with Ophthalmology, Podiatrist at least yearly or according to  recommendations, and advised to   stay away from smoking. I have recommended yearly flu vaccine and pneumonia vaccination at least every 5 years; moderate intensity exercise for up to 150 minutes weekly; and  sleep for at least 7 hours a day.  - 25 minutes of time was spent on the care of this patient , 50% of which was applied for counseling on diabetes complications and their preventions. - I advised patient to maintain close follow up with [facility], MD for primary care needs.  Follow up plan: - Return in about 6 weeks (around 03/09/2016).  Glade Lloyd, MD Phone: [phone]  Fax: [phone]   01/27/2016, 11:43 AM

## 2016-01-31 ENCOUNTER — Ambulatory Visit: Payer: Medicare Other | Attending: Pulmonary Disease | Admitting: Neurology

## 2016-01-31 DIAGNOSIS — G4730 Sleep apnea, unspecified: Secondary | ICD-10-CM

## 2016-01-31 DIAGNOSIS — G4733 Obstructive sleep apnea (adult) (pediatric): Secondary | ICD-10-CM | POA: Diagnosis not present

## 2016-02-01 DIAGNOSIS — J322 Chronic ethmoidal sinusitis: Secondary | ICD-10-CM | POA: Diagnosis not present

## 2016-02-02 DIAGNOSIS — Z794 Long term (current) use of insulin: Secondary | ICD-10-CM | POA: Diagnosis not present

## 2016-02-02 DIAGNOSIS — Z79899 Other long term (current) drug therapy: Secondary | ICD-10-CM | POA: Diagnosis not present

## 2016-02-02 DIAGNOSIS — G4730 Sleep apnea, unspecified: Secondary | ICD-10-CM | POA: Diagnosis not present

## 2016-02-02 DIAGNOSIS — M65342 Trigger finger, left ring finger: Secondary | ICD-10-CM | POA: Diagnosis not present

## 2016-02-02 DIAGNOSIS — K219 Gastro-esophageal reflux disease without esophagitis: Secondary | ICD-10-CM | POA: Diagnosis not present

## 2016-02-02 DIAGNOSIS — I129 Hypertensive chronic kidney disease with stage 1 through stage 4 chronic kidney disease, or unspecified chronic kidney disease: Secondary | ICD-10-CM | POA: Diagnosis not present

## 2016-02-02 DIAGNOSIS — J45909 Unspecified asthma, uncomplicated: Secondary | ICD-10-CM | POA: Diagnosis not present

## 2016-02-02 DIAGNOSIS — E7800 Pure hypercholesterolemia, unspecified: Secondary | ICD-10-CM | POA: Diagnosis not present

## 2016-02-02 DIAGNOSIS — N183 Chronic kidney disease, stage 3 (moderate): Secondary | ICD-10-CM | POA: Diagnosis not present

## 2016-02-02 DIAGNOSIS — E1122 Type 2 diabetes mellitus with diabetic chronic kidney disease: Secondary | ICD-10-CM | POA: Diagnosis not present

## 2016-02-10 DIAGNOSIS — R05 Cough: Secondary | ICD-10-CM | POA: Diagnosis not present

## 2016-02-10 DIAGNOSIS — J4540 Moderate persistent asthma, uncomplicated: Secondary | ICD-10-CM | POA: Diagnosis not present

## 2016-02-12 NOTE — Procedures (Signed)
Callery A. Merlene Laughter, MD     [email or web address]             NOCTURNAL POLYSOMNOGRAPHY   LOCATION: [facility]  Patient Name: Allen [name], Allen [name] Date: 01/31/2016 Gender: Male D.O.B: [birth year] Age (years): 49 Referring Provider: Lennox Solders Height (inches): 71 Interpreting Physician: Phillips Odor MD, ABSM Weight (lbs): 239 RPSGT: Rosebud Poles BMI: 33 MRN: [id number] Neck Size: 17.00 CLINICAL INFORMATION Sleep Study Type: Split Night CPAP  Indication for sleep study: N/A  Epworth Sleepiness Score: 11  SLEEP STUDY TECHNIQUE As per the AASM Manual for the Scoring of Sleep and Associated Events v2.3 (April 2016) with a hypopnea requiring 4% desaturations.  The channels recorded and monitored were frontal, central and occipital EEG, electrooculogram (EOG), submentalis EMG (chin), nasal and oral airflow, thoracic and abdominal wall motion, anterior tibialis EMG, snore microphone, electrocardiogram, and pulse oximetry. Continuous positive airway pressure (CPAP) was initiated when the patient met split night criteria and was titrated according to treat sleep-disordered breathing.  MEDICATIONS Medications self-administered by patient taken the night of the study : N/A  Current Outpatient Prescriptions:  .  amLODipine (NORVASC) 10 MG tablet, Take 10 mg by mouth daily., Disp: , Rfl:  .  atorvastatin (LIPITOR) 20 MG tablet, Take 20 mg by mouth daily., Disp: , Rfl:  .  eplerenone (INSPRA) 50 MG tablet, Take 50 mg by mouth daily., Disp: , Rfl:  .  fenofibrate (TRICOR) 145 MG tablet, Take 145 mg by mouth daily., Disp: , Rfl:  .  Fluticasone-Salmeterol (ADVAIR DISKUS) 500-50 MCG/DOSE AEPB, Inhale 1 puff into the lungs daily., Disp: , Rfl:  .  gabapentin (NEURONTIN) 300 MG capsule, Take 300 mg by mouth at bedtime., Disp: , Rfl:  .  insulin aspart (NOVOLOG FLEXPEN) 100 UNIT/ML FlexPen, Inject 15-21 Units into the skin 3 (three) times daily with meals., Disp: 30  mL, Rfl: 2 .  Insulin Glargine (LANTUS SOLOSTAR) 100 UNIT/ML Solostar Pen, Inject 80 Units into the skin at bedtime., Disp: 30 mL, Rfl: 2 .  losartan (COZAAR) 100 MG tablet, Take 100 mg by mouth daily., Disp: , Rfl:  .  metFORMIN (GLUCOPHAGE) 500 MG tablet, TAKE 1 TABLET (500 MG TOTAL) BY MOUTH 2 (TWO) TIMES DAILY WITH A MEAL., Disp: 180 tablet, Rfl: 0 .  metoprolol (LOPRESSOR) 50 MG tablet, Take 50 mg by mouth every morning., Disp: , Rfl:  .  mometasone (NASONEX) 50 MCG/ACT nasal spray, Place 2 sprays into the nose daily., Disp: , Rfl:  .  montelukast (SINGULAIR) 10 MG tablet, Take 10 mg by mouth at bedtime., Disp: , Rfl:  .  Omega-3 Fatty Acids (FISH OIL) 1000 MG CAPS, Take by mouth 2 (two) times daily., Disp: , Rfl:    RESPIRATORY PARAMETERS Diagnostic  Total AHI (/hr): 26.7 RDI (/hr): 26.7 OA Index (/hr): 0.9 CA Index (/hr): 0.0 REM AHI (/hr): N/A NREM AHI (/hr): 26.7 Supine AHI (/hr): 61.5 Non-supine AHI (/hr): 8.17 Min O2 Sat (%): 83.00 Mean O2 (%): 89.10 Time below 88% (min): 59.0     Titration  Optimal Pressure (cm): 9 AHI at Optimal Pressure (/hr): 2.3 Min O2 at Optimal Pressure (%): 87.00 Supine % at Optimal (%): N/A Sleep % at Optimal (%): N/A     SLEEP ARCHITECTURE The recording time for the entire night was 391.2 minutes.  During a baseline period of 157.3 minutes, the patient slept for 135.0 minutes in REM and nonREM, yielding a sleep efficiency of 85.8%. Sleep onset after lights out was 11.3 minutes  with a REM latency of N/A minutes. The patient spent 0.74% of the night in stage N1 sleep, 67.41% in stage N2 sleep, 31.85% in stage N3 and 0.00% in REM.  During the titration period of 209.5 minutes, the patient slept for 191.4 minutes in REM and nonREM, yielding a sleep efficiency of 91.4%. Sleep onset after CPAP initiation was 9.6 minutes with a REM latency of 98.5 minutes. The patient spent 1.83% of the night in stage N1 sleep, 32.65% in stage N2 sleep, 33.39% in stage N3 and  32.13% in REM.  CARDIAC DATA The 2 lead EKG demonstrated sinus rhythm. The mean heart rate was N/A beats per minute. Other EKG findings include: None. LEG MOVEMENT DATA The total Periodic Limb Movements of Sleep (PLMS) were 96. The PLMS index was 17.46.  IMPRESSIONS - Moderate obstructive sleep apnea occurred during the diagnostic portion of the study (AHI = 26.7/hour). An optimal CPAP of 9 is recommended base the data. - Moderate periodic limb movements of sleep occurred during the study.   Delano Metz, MD Diplomate, American Board of Sleep Medicine.

## 2016-02-15 DIAGNOSIS — R05 Cough: Secondary | ICD-10-CM | POA: Diagnosis not present

## 2016-02-15 DIAGNOSIS — J449 Chronic obstructive pulmonary disease, unspecified: Secondary | ICD-10-CM | POA: Diagnosis not present

## 2016-02-15 DIAGNOSIS — J329 Chronic sinusitis, unspecified: Secondary | ICD-10-CM | POA: Diagnosis not present

## 2016-02-27 DIAGNOSIS — J4540 Moderate persistent asthma, uncomplicated: Secondary | ICD-10-CM | POA: Diagnosis not present

## 2016-02-27 DIAGNOSIS — R05 Cough: Secondary | ICD-10-CM | POA: Diagnosis not present

## 2016-02-28 ENCOUNTER — Ambulatory Visit: Payer: Medicare Other | Admitting: "Endocrinology

## 2016-02-28 ENCOUNTER — Ambulatory Visit: Payer: Medicare Other | Admitting: Nutrition

## 2016-02-28 DIAGNOSIS — J324 Chronic pansinusitis: Secondary | ICD-10-CM | POA: Diagnosis not present

## 2016-02-28 DIAGNOSIS — J322 Chronic ethmoidal sinusitis: Secondary | ICD-10-CM | POA: Diagnosis not present

## 2016-03-01 DIAGNOSIS — E1122 Type 2 diabetes mellitus with diabetic chronic kidney disease: Secondary | ICD-10-CM | POA: Diagnosis not present

## 2016-03-01 DIAGNOSIS — N183 Chronic kidney disease, stage 3 (moderate): Secondary | ICD-10-CM | POA: Diagnosis not present

## 2016-03-01 DIAGNOSIS — E1165 Type 2 diabetes mellitus with hyperglycemia: Secondary | ICD-10-CM | POA: Diagnosis not present

## 2016-03-01 DIAGNOSIS — Z794 Long term (current) use of insulin: Secondary | ICD-10-CM | POA: Diagnosis not present

## 2016-03-02 LAB — CMP14+EGFR
A/G RATIO: 1.7 (ref 1.2–2.2)
ALT: 16 IU/L (ref 0–44)
AST: 21 IU/L (ref 0–40)
Albumin: 4.4 g/dL (ref 3.6–4.8)
Alkaline Phosphatase: 55 IU/L (ref 39–117)
BUN/Creatinine Ratio: 18 (ref 10–24)
BUN: 30 mg/dL — ABNORMAL HIGH (ref 8–27)
Bilirubin Total: 0.5 mg/dL (ref 0.0–1.2)
CALCIUM: 9.7 mg/dL (ref 8.6–10.2)
CO2: 23 mmol/L (ref 18–29)
Chloride: 101 mmol/L (ref 96–106)
Creatinine, Ser: 1.63 mg/dL — ABNORMAL HIGH (ref 0.76–1.27)
GFR, EST AFRICAN AMERICAN: 50 mL/min/{1.73_m2} — AB (ref >59)
GFR, EST NON AFRICAN AMERICAN: 43 mL/min/{1.73_m2} — AB (ref >59)
GLOBULIN, TOTAL: 2.6 g/dL (ref 1.5–4.5)
Glucose: 79 mg/dL (ref 65–99)
POTASSIUM: 4 mmol/L (ref 3.5–5.2)
Sodium: 142 mmol/L (ref 134–144)
TOTAL PROTEIN: 7 g/dL (ref 6.0–8.5)

## 2016-03-02 LAB — HEMOGLOBIN A1C
Est. average glucose Bld gHb Est-mCnc: 212 mg/dL
Hgb A1c MFr Bld: 9 % — ABNORMAL HIGH (ref 4.8–5.6)

## 2016-03-07 ENCOUNTER — Ambulatory Visit (INDEPENDENT_AMBULATORY_CARE_PROVIDER_SITE_OTHER): Payer: Medicare Other | Admitting: "Endocrinology

## 2016-03-07 ENCOUNTER — Encounter: Payer: Self-pay | Admitting: "Endocrinology

## 2016-03-07 VITALS — BP 134/82 | HR 76 | Resp 18 | Ht 71.0 in | Wt 248.4 lb

## 2016-03-07 DIAGNOSIS — E1165 Type 2 diabetes mellitus with hyperglycemia: Secondary | ICD-10-CM | POA: Diagnosis not present

## 2016-03-07 DIAGNOSIS — IMO0002 Reserved for concepts with insufficient information to code with codable children: Secondary | ICD-10-CM

## 2016-03-07 DIAGNOSIS — Z794 Long term (current) use of insulin: Secondary | ICD-10-CM | POA: Diagnosis not present

## 2016-03-07 DIAGNOSIS — E1122 Type 2 diabetes mellitus with diabetic chronic kidney disease: Secondary | ICD-10-CM | POA: Diagnosis not present

## 2016-03-07 DIAGNOSIS — N183 Chronic kidney disease, stage 3 (moderate): Secondary | ICD-10-CM

## 2016-03-07 DIAGNOSIS — E782 Mixed hyperlipidemia: Secondary | ICD-10-CM | POA: Diagnosis not present

## 2016-03-07 DIAGNOSIS — I10 Essential (primary) hypertension: Secondary | ICD-10-CM

## 2016-03-07 MED ORDER — INSULIN ASPART 100 UNIT/ML FLEXPEN: 18.0000 [IU] | PEN_INJECTOR | Freq: Three times a day (TID) | SUBCUTANEOUS | 2 refills | Status: DC

## 2016-03-07 MED ORDER — DEXAMETHASONE 1 MG PO TABS: 1.0000 mg | ORAL_TABLET | Freq: Once | ORAL | 0 refills | Status: AC

## 2016-03-07 NOTE — Progress Notes (Signed)
Subjective:    Patient ID: Allen [name], male    DOB: 11-Jun-1950. Patient is being seen in f/u for management of diabetes requested by  Orpah Greek, MD  Past Medical History:  Diagnosis Date  . Diabetes mellitus, type II ([place])   . Hyperlipidemia   . Hypertension   . Kidney disease    Past Surgical History:  Procedure Laterality Date  . carpal tunnel    . CHOLECYSTECTOMY    . NASAL SINUS SURGERY     Social History   Social History  . Marital status: Married    Spouse name: N/A  . Number of children: N/A  . Years of education: N/A   Social History Main Topics  . Smoking status: Never Smoker  . Smokeless tobacco: Never Used  . Alcohol use No  . Drug use: No  . Sexual activity: Not Asked   Other Topics Concern  . None   Social History Narrative  . None   Outpatient Encounter Prescriptions as of 03/07/2016  Medication Sig  . amLODipine (NORVASC) 10 MG tablet Take 10 mg by mouth daily.  Marland Kitchen atorvastatin (LIPITOR) 20 MG tablet Take 20 mg by mouth daily.  Marland Kitchen eplerenone (INSPRA) 50 MG tablet Take 50 mg by mouth daily.  . fenofibrate (TRICOR) 145 MG tablet Take 145 mg by mouth daily.  . Fluticasone-Salmeterol (ADVAIR DISKUS) 500-50 MCG/DOSE AEPB Inhale 1 puff into the lungs daily.  Marland Kitchen gabapentin (NEURONTIN) 300 MG capsule Take 300 mg by mouth at bedtime.  . insulin aspart (NOVOLOG FLEXPEN) 100 UNIT/ML FlexPen Inject 18-24 Units into the skin 3 (three) times daily with meals.  . Insulin Glargine (LANTUS SOLOSTAR) 100 UNIT/ML Solostar Pen Inject 80 Units into the skin at bedtime.  Marland Kitchen losartan (COZAAR) 100 MG tablet Take 100 mg by mouth daily.  . metFORMIN (GLUCOPHAGE) 500 MG tablet TAKE 1 TABLET (500 MG TOTAL) BY MOUTH 2 (TWO) TIMES DAILY WITH A MEAL.  . metoprolol (LOPRESSOR) 50 MG tablet Take 50 mg by mouth every morning.  . mometasone (NASONEX) 50 MCG/ACT nasal spray Place 2 sprays into the nose daily.  . montelukast (SINGULAIR) 10 MG tablet Take 10 mg by mouth at bedtime.   . Omega-3 Fatty Acids (FISH OIL) 1000 MG CAPS Take by mouth 2 (two) times daily.  . [DISCONTINUED] insulin aspart (NOVOLOG FLEXPEN) 100 UNIT/ML FlexPen Inject 15-21 Units into the skin 3 (three) times daily with meals.  Marland Kitchen dexamethasone (DECADRON) 1 MG tablet Take 1 tablet (1 mg total) by mouth once.   No facility-administered encounter medications on file as of 03/07/2016.    ALLERGIES: No Known Allergies VACCINATION STATUS:  There is no immunization history on file for this patient.  Diabetes  He presents for his follow-up diabetic visit. He has type 2 diabetes mellitus. Onset time: He was diagnosed at approximate age of 58 years. His disease course has been improving. There are no hypoglycemic associated symptoms. Pertinent negatives for hypoglycemia include no confusion, headaches, pallor or seizures. Pertinent negatives for diabetes include no chest pain, no fatigue, no polydipsia, no polyphagia, no polyuria and no weakness. There are no hypoglycemic complications. Symptoms are improving. Diabetic complications include nephropathy. Risk factors for coronary artery disease include diabetes mellitus, dyslipidemia, hypertension, male sex, sedentary lifestyle and obesity. Current diabetic treatment includes insulin injections (He is on Lantus 78 units nightly and NovoLog 30-40 units 3 times a day before meals.). His weight is increasing steadily. He is following a generally unhealthy diet. When asked about  meal planning, he reported none. He has not had a previous visit with a dietitian. He participates in exercise intermittently (He participates in golfing activities.). His breakfast blood glucose range is generally 180-200 mg/dl. His lunch blood glucose range is generally 180-200 mg/dl. His dinner blood glucose range is generally 180-200 mg/dl. His overall blood glucose range is 180-200 mg/dl. An ACE inhibitor/angiotensin II receptor blocker is being taken. Eye exam is current.  Hyperlipidemia   This is a chronic problem. The current episode started more than 1 year ago. The problem is uncontrolled. Exacerbating diseases include chronic renal disease, diabetes and obesity. Pertinent negatives include no chest pain, myalgias or shortness of breath. Current antihyperlipidemic treatment includes statins and bile acid squestrants. Compliance problems include adherence to diet.  Risk factors for coronary artery disease include diabetes mellitus, dyslipidemia, hypertension, male sex, obesity and a sedentary lifestyle.  Hypertension  This is a chronic problem. The current episode started more than 1 year ago. The problem is uncontrolled. Pertinent negatives include no chest pain, headaches, neck pain, palpitations or shortness of breath. Risk factors for coronary artery disease include diabetes mellitus. Past treatments include angiotensin blockers. Hypertensive end-organ damage includes kidney disease. Identifiable causes of hypertension include chronic renal disease.     Review of Systems  Constitutional: Negative for chills, fatigue, fever and unexpected weight change.  HENT: Negative for dental problem, mouth sores and trouble swallowing.   Eyes: Negative for visual disturbance.  Respiratory: Negative for cough, choking, chest tightness, shortness of breath and wheezing.   Cardiovascular: Negative for chest pain, palpitations and leg swelling.  Gastrointestinal: Negative for abdominal distention, abdominal pain, constipation, diarrhea, nausea and vomiting.  Endocrine: Negative for polydipsia, polyphagia and polyuria.  Genitourinary: Negative for dysuria, flank pain, hematuria and urgency.  Musculoskeletal: Negative for back pain, gait problem, myalgias and neck pain.  Skin: Negative for pallor, rash and wound.  Neurological: Negative for seizures, syncope, weakness, numbness and headaches.  Psychiatric/Behavioral: Negative.  Negative for confusion and dysphoric mood.    Objective:     BP 134/82   Pulse 76   Resp 18   Ht 5\' 11"  (1.803 m)   Wt 248 lb 6.4 oz (112.7 kg)   SpO2 91%   BMI 34.64 kg/m   Wt Readings from Last 3 Encounters:  03/07/16 248 lb 6.4 oz (112.7 kg)  01/27/16 239 lb (108.4 kg)  01/05/16 240 lb (108.9 kg)    Physical Exam  Constitutional: He is oriented to person, place, and time. He appears well-developed and well-nourished. He is cooperative. No distress.  HENT:  Head: Normocephalic and atraumatic.  Eyes: EOM are normal.  Neck: Normal range of motion. Neck supple. No tracheal deviation present. No thyromegaly present.  Cardiovascular: Normal rate, S1 normal, S2 normal and normal heart sounds.  Exam reveals no gallop.   No murmur heard. Pulses:      Dorsalis pedis pulses are 1+ on the right side, and 1+ on the left side.       Posterior tibial pulses are 1+ on the right side, and 1+ on the left side.  Pulmonary/Chest: Breath sounds normal. No respiratory distress. He has no wheezes.  Abdominal: Soft. Bowel sounds are normal. He exhibits no distension. There is no tenderness. There is no guarding and no CVA tenderness.  Musculoskeletal: He exhibits no edema.       Right shoulder: He exhibits no swelling and no deformity.  Neurological: He is alert and oriented to person, place, and time. He has  normal strength and normal reflexes. No cranial nerve deficit or sensory deficit. Gait normal.  Skin: Skin is warm and dry. No rash noted. No cyanosis. Nails show no clubbing.  Psychiatric: He has a normal mood and affect. His speech is normal and behavior is normal. Judgment and thought content normal. Cognition and memory are normal.    Assessment & Plan:   1. Uncontrolled type 2 diabetes mellitus with stage 3 chronic kidney disease, with long-term current use of insulin ([place]) - Patient has currently uncontrolled symptomatic type 2 DM since  66 years of age,  with A1c of  9% increasing from 7.9%, after generally improved from 10.4%.  his diabetes is  complicated by mild  CKD  and patient remains at a high risk for more acute and chronic complications of diabetes which include CAD, CVA, CKD, retinopathy, and neuropathy. These are all discussed in detail with the patient.  - I have counseled the patient on diet management and weight loss, by adopting a carbohydrate restricted/protein rich diet.  - Suggestion is made for patient to avoid simple carbohydrates   from their diet including Cakes , Desserts, Ice Cream,  Soda (  diet and regular) , Sweet Tea , Candies,  Chips, Cookies, Artificial Sweeteners,   and "Sugar-free" Products . This will help patient to have stable blood glucose profile and potentially avoid unintended weight gain.  - I encouraged the patient to switch to  unprocessed or minimally processed complex starch and increased protein intake (animal or plant source), fruits, and vegetables.  - Patient is advised to stick to a routine mealtimes to eat 3 meals  a day and avoid unnecessary snacks ( to snack only to correct hypoglycemia).  - The patient will be scheduled with Jearld Fenton, RDN, CDE for individualized DM education.  - I have approached patient with the following individualized plan to manage diabetes and patient agrees:   - He came with improving but still above target blood glucose profile , despite large dose of insulin in the form of Lantus and NovoLog.  - I  Will continue Lantus at 80 units daily at bedtime, associated with strict monitoring of glucose daily before meals and at bedtime. -I  will increase NovoLog to 18 units  3 times a day before meals, plus correction for unexpected hyperglycemia above 150 g/dL.  -Patient is encouraged to call clinic for blood glucose levels less than 70 or above 200 mg /dl.  -His GFR is 45, I will continue low-dose metformin 500 mg by mouth twice a day.  -Reportedly, he did not tolerate Trulicity . - I will proceed to perform dexamethasone suppression test to screen for  endogenous steroid burden.  - Patient specific target  A1c;  LDL, HDL, Triglycerides, and  Waist Circumference were discussed in detail.  2) BP/HTN: uncontrolled. Continue current medications including ACEI/ARB. 3) Lipids/HPL:  Uncontrolled, triglycerides 267 LDL is 74  continue statins, TriCor and , omega-3 fatty acids.   4)  Weight/Diet: CDE Consult will be initiated , exercise, and detailed carbohydrates information provided. I gave him a brochure and information on bariatric surgery. - He has started the evaluation process for bariatric surgery. In March 2017 he weighed 256 pounds with BMI of 35.7, he lost 17 pounds due to caloric restriction. However, his weight is a backup 248 pounds over the last 3 visits.   I believe he would benefit from this procedure from a number of comorbidity point of view including type 2 diabetes, sleep apnea,  hypertension, hyperlipidemia.  5) Chronic Care/Health Maintenance:  -Patient is on ACEI/ARB and Statin medications and encouraged to continue to follow up with Ophthalmology, Podiatrist at least yearly or according to recommendations, and advised to   stay away from smoking. I have recommended yearly flu vaccine and pneumonia vaccination at least every 5 years; moderate intensity exercise for up to 150 minutes weekly; and  sleep for at least 7 hours a day.  - 25 minutes of time was spent on the care of this patient , 50% of which was applied for counseling on diabetes complications and their preventions. - I advised patient to maintain close follow up with Orpah Greek, MD for primary care needs.  Follow up plan: - Return in about 3 months (around 06/04/2016) for meter, and logs.  Glade Lloyd, MD Phone: [phone]  Fax: [phone]   03/07/2016, 4:16 PM

## 2016-03-07 NOTE — Patient Instructions (Signed)

## 2016-03-09 ENCOUNTER — Ambulatory Visit: Payer: Medicare Other | Admitting: "Endocrinology

## 2016-03-12 DIAGNOSIS — J4540 Moderate persistent asthma, uncomplicated: Secondary | ICD-10-CM | POA: Diagnosis not present

## 2016-03-12 DIAGNOSIS — R05 Cough: Secondary | ICD-10-CM | POA: Diagnosis not present

## 2016-03-16 DIAGNOSIS — E1122 Type 2 diabetes mellitus with diabetic chronic kidney disease: Secondary | ICD-10-CM | POA: Diagnosis not present

## 2016-03-16 DIAGNOSIS — E1165 Type 2 diabetes mellitus with hyperglycemia: Secondary | ICD-10-CM | POA: Diagnosis not present

## 2016-03-16 DIAGNOSIS — N183 Chronic kidney disease, stage 3 (moderate): Secondary | ICD-10-CM | POA: Diagnosis not present

## 2016-03-16 DIAGNOSIS — Z794 Long term (current) use of insulin: Secondary | ICD-10-CM | POA: Diagnosis not present

## 2016-03-17 LAB — CORTISOL-AM, BLOOD: Cortisol - AM: 1.6 ug/dL — ABNORMAL LOW

## 2016-03-19 DIAGNOSIS — J019 Acute sinusitis, unspecified: Secondary | ICD-10-CM | POA: Diagnosis not present

## 2016-03-19 DIAGNOSIS — I872 Venous insufficiency (chronic) (peripheral): Secondary | ICD-10-CM | POA: Diagnosis not present

## 2016-03-19 DIAGNOSIS — R05 Cough: Secondary | ICD-10-CM | POA: Diagnosis not present

## 2016-03-19 DIAGNOSIS — J449 Chronic obstructive pulmonary disease, unspecified: Secondary | ICD-10-CM | POA: Diagnosis not present

## 2016-03-19 DIAGNOSIS — G4733 Obstructive sleep apnea (adult) (pediatric): Secondary | ICD-10-CM | POA: Diagnosis not present

## 2016-03-19 DIAGNOSIS — R0609 Other forms of dyspnea: Secondary | ICD-10-CM | POA: Diagnosis not present

## 2016-03-19 DIAGNOSIS — I10 Essential (primary) hypertension: Secondary | ICD-10-CM | POA: Diagnosis not present

## 2016-03-19 LAB — ACTH: C206 ACTH: 7 pg/mL (ref 6–50)

## 2016-03-27 DIAGNOSIS — J4540 Moderate persistent asthma, uncomplicated: Secondary | ICD-10-CM | POA: Diagnosis not present

## 2016-03-27 DIAGNOSIS — R05 Cough: Secondary | ICD-10-CM | POA: Diagnosis not present

## 2016-03-30 DIAGNOSIS — E785 Hyperlipidemia, unspecified: Secondary | ICD-10-CM | POA: Diagnosis not present

## 2016-03-30 DIAGNOSIS — Z22322 Carrier or suspected carrier of Methicillin resistant Staphylococcus aureus: Secondary | ICD-10-CM | POA: Diagnosis not present

## 2016-03-30 DIAGNOSIS — R05 Cough: Secondary | ICD-10-CM | POA: Diagnosis not present

## 2016-03-30 DIAGNOSIS — J449 Chronic obstructive pulmonary disease, unspecified: Secondary | ICD-10-CM | POA: Diagnosis not present

## 2016-03-30 DIAGNOSIS — K210 Gastro-esophageal reflux disease with esophagitis: Secondary | ICD-10-CM | POA: Diagnosis not present

## 2016-03-30 DIAGNOSIS — I10 Essential (primary) hypertension: Secondary | ICD-10-CM | POA: Diagnosis not present

## 2016-04-02 ENCOUNTER — Other Ambulatory Visit: Payer: Self-pay | Admitting: "Endocrinology

## 2016-04-04 ENCOUNTER — Other Ambulatory Visit (HOSPITAL_COMMUNITY): Payer: Self-pay | Admitting: Surgery

## 2016-04-10 DIAGNOSIS — R05 Cough: Secondary | ICD-10-CM | POA: Diagnosis not present

## 2016-04-10 DIAGNOSIS — J4540 Moderate persistent asthma, uncomplicated: Secondary | ICD-10-CM | POA: Diagnosis not present

## 2016-04-16 ENCOUNTER — Ambulatory Visit (INDEPENDENT_AMBULATORY_CARE_PROVIDER_SITE_OTHER): Payer: Medicare Other | Admitting: "Endocrinology

## 2016-04-16 ENCOUNTER — Encounter: Payer: Self-pay | Admitting: "Endocrinology

## 2016-04-16 VITALS — BP 156/98 | HR 70 | Ht 71.0 in | Wt 255.0 lb

## 2016-04-16 DIAGNOSIS — I10 Essential (primary) hypertension: Secondary | ICD-10-CM | POA: Diagnosis not present

## 2016-04-16 DIAGNOSIS — N183 Chronic kidney disease, stage 3 (moderate): Secondary | ICD-10-CM

## 2016-04-16 DIAGNOSIS — E1165 Type 2 diabetes mellitus with hyperglycemia: Secondary | ICD-10-CM | POA: Diagnosis not present

## 2016-04-16 DIAGNOSIS — E1122 Type 2 diabetes mellitus with diabetic chronic kidney disease: Secondary | ICD-10-CM | POA: Diagnosis not present

## 2016-04-16 DIAGNOSIS — Z794 Long term (current) use of insulin: Secondary | ICD-10-CM | POA: Diagnosis not present

## 2016-04-16 DIAGNOSIS — IMO0002 Reserved for concepts with insufficient information to code with codable children: Secondary | ICD-10-CM

## 2016-04-16 DIAGNOSIS — E669 Obesity, unspecified: Secondary | ICD-10-CM | POA: Diagnosis not present

## 2016-04-16 DIAGNOSIS — E782 Mixed hyperlipidemia: Secondary | ICD-10-CM

## 2016-04-16 MED ORDER — INSULIN ASPART 100 UNIT/ML FLEXPEN: 20.0000 [IU] | PEN_INJECTOR | Freq: Three times a day (TID) | SUBCUTANEOUS | 2 refills | Status: DC

## 2016-04-16 MED ORDER — INSULIN GLARGINE 100 UNIT/ML SOLOSTAR PEN: 90.0000 [IU] | PEN_INJECTOR | Freq: Every day | SUBCUTANEOUS | 2 refills | Status: DC

## 2016-04-16 NOTE — Progress Notes (Signed)
Subjective:    Patient ID: Allen [name], male    DOB: [birth year]. Patient is being seen in f/u for management of diabetes requested by  Orpah Greek, MD  Past Medical History:  Diagnosis Date  . Diabetes mellitus, type II ([place])   . Hyperlipidemia   . Hypertension   . Kidney disease    Past Surgical History:  Procedure Laterality Date  . carpal tunnel    . CHOLECYSTECTOMY    . NASAL SINUS SURGERY     Social History   Social History  . Marital status: Married    Spouse name: N/A  . Number of children: N/A  . Years of education: N/A   Social History Main Topics  . Smoking status: Never Smoker  . Smokeless tobacco: Never Used  . Alcohol use No  . Drug use: No  . Sexual activity: Not Asked   Other Topics Concern  . None   Social History Narrative  . None   Outpatient Encounter Prescriptions as of 04/16/2016  Medication Sig  . amLODipine (NORVASC) 10 MG tablet Take 10 mg by mouth daily.  Marland Kitchen atorvastatin (LIPITOR) 20 MG tablet Take 20 mg by mouth daily.  Marland Kitchen eplerenone (INSPRA) 50 MG tablet Take 50 mg by mouth daily.  . fenofibrate (TRICOR) 145 MG tablet Take 145 mg by mouth daily.  . Fluticasone-Salmeterol (ADVAIR DISKUS) 500-50 MCG/DOSE AEPB Inhale 1 puff into the lungs daily.  Marland Kitchen gabapentin (NEURONTIN) 300 MG capsule Take 300 mg by mouth at bedtime.  . insulin aspart (NOVOLOG FLEXPEN) 100 UNIT/ML FlexPen Inject 18-24 Units into the skin 3 (three) times daily with meals.  . Insulin Glargine (LANTUS SOLOSTAR) 100 UNIT/ML Solostar Pen Inject 80 Units into the skin at bedtime.  Marland Kitchen losartan (COZAAR) 100 MG tablet Take 100 mg by mouth daily.  . metFORMIN (GLUCOPHAGE) 500 MG tablet TAKE 1 TABLET (500 MG TOTAL) BY MOUTH 2 (TWO) TIMES DAILY WITH A MEAL.  . metoprolol (LOPRESSOR) 50 MG tablet Take 50 mg by mouth every morning.  . mometasone (NASONEX) 50 MCG/ACT nasal spray Place 2 sprays into the nose daily.  . montelukast (SINGULAIR) 10 MG tablet Take 10 mg by mouth at bedtime.   . Omega-3 Fatty Acids (FISH OIL) 1000 MG CAPS Take by mouth 2 (two) times daily.   No facility-administered encounter medications on file as of 04/16/2016.    ALLERGIES: No Known Allergies VACCINATION STATUS:  There is no immunization history on file for this patient.  Diabetes  He presents for his follow-up diabetic visit. He has type 2 diabetes mellitus. Onset time: He was diagnosed at approximate age of 38 years. His disease course has been improving. There are no hypoglycemic associated symptoms. Pertinent negatives for hypoglycemia include no confusion, headaches, pallor or seizures. Pertinent negatives for diabetes include no chest pain, no fatigue, no polydipsia, no polyphagia, no polyuria and no weakness. There are no hypoglycemic complications. Symptoms are improving. Diabetic complications include nephropathy. Risk factors for coronary artery disease include diabetes mellitus, dyslipidemia, hypertension, male sex, sedentary lifestyle and obesity. Current diabetic treatment includes insulin injections (He is on Lantus 78 units nightly and NovoLog 30-40 units 3 times a day before meals.). His weight is increasing steadily. He is following a generally unhealthy diet. When asked about meal planning, he reported none. He has not had a previous visit with a dietitian. He participates in exercise intermittently (He participates in golfing activities.). His breakfast blood glucose range is generally 180-200 mg/dl. His lunch blood glucose  range is generally 180-200 mg/dl. His dinner blood glucose range is generally 180-200 mg/dl. His overall blood glucose range is 180-200 mg/dl. An ACE inhibitor/angiotensin II receptor blocker is being taken. Eye exam is current.  Hyperlipidemia  This is a chronic problem. The current episode started more than 1 year ago. The problem is uncontrolled. Exacerbating diseases include chronic renal disease, diabetes and obesity. Pertinent negatives include no chest pain,  myalgias or shortness of breath. Current antihyperlipidemic treatment includes statins and bile acid squestrants. Compliance problems include adherence to diet.  Risk factors for coronary artery disease include diabetes mellitus, dyslipidemia, hypertension, male sex, obesity and a sedentary lifestyle.  Hypertension  This is a chronic problem. The current episode started more than 1 year ago. The problem is uncontrolled. Pertinent negatives include no chest pain, headaches, neck pain, palpitations or shortness of breath. Risk factors for coronary artery disease include diabetes mellitus. Past treatments include angiotensin blockers. Hypertensive end-organ damage includes kidney disease. Identifiable causes of hypertension include chronic renal disease.     Review of Systems  Constitutional: Negative for chills, fatigue, fever and unexpected weight change.  HENT: Negative for dental problem, mouth sores and trouble swallowing.   Eyes: Negative for visual disturbance.  Respiratory: Negative for cough, choking, chest tightness, shortness of breath and wheezing.   Cardiovascular: Negative for chest pain, palpitations and leg swelling.  Gastrointestinal: Negative for abdominal distention, abdominal pain, constipation, diarrhea, nausea and vomiting.  Endocrine: Negative for polydipsia, polyphagia and polyuria.  Genitourinary: Negative for dysuria, flank pain, hematuria and urgency.  Musculoskeletal: Negative for back pain, gait problem, myalgias and neck pain.  Skin: Negative for pallor, rash and wound.  Neurological: Negative for seizures, syncope, weakness, numbness and headaches.  Psychiatric/Behavioral: Negative.  Negative for confusion and dysphoric mood.    Objective:    BP (!) 156/98   Pulse 70   Ht 5\' 11"  (1.803 m)   Wt 255 lb (115.7 kg)   BMI 35.57 kg/m   Wt Readings from Last 3 Encounters:  04/16/16 255 lb (115.7 kg)  03/07/16 248 lb 6.4 oz (112.7 kg)  01/27/16 239 lb (108.4 kg)     Physical Exam  Constitutional: He is oriented to person, place, and time. He appears well-developed and well-nourished. He is cooperative. No distress.  HENT:  Head: Normocephalic and atraumatic.  Eyes: EOM are normal.  Neck: Normal range of motion. Neck supple. No tracheal deviation present. No thyromegaly present.  Cardiovascular: Normal rate, S1 normal, S2 normal and normal heart sounds.  Exam reveals no gallop.   No murmur heard. Pulses:      Dorsalis pedis pulses are 1+ on the right side, and 1+ on the left side.       Posterior tibial pulses are 1+ on the right side, and 1+ on the left side.  Pulmonary/Chest: Breath sounds normal. No respiratory distress. He has no wheezes.  Abdominal: Soft. Bowel sounds are normal. He exhibits no distension. There is no tenderness. There is no guarding and no CVA tenderness.  Musculoskeletal: He exhibits no edema.       Right shoulder: He exhibits no swelling and no deformity.  Neurological: He is alert and oriented to person, place, and time. He has normal strength and normal reflexes. No cranial nerve deficit or sensory deficit. Gait normal.  Skin: Skin is warm and dry. No rash noted. No cyanosis. Nails show no clubbing.  Psychiatric: He has a normal mood and affect. His speech is normal and behavior is normal.  Judgment and thought content normal. Cognition and memory are normal.    Assessment & Plan:   1. Uncontrolled type 2 diabetes mellitus with stage 3 chronic kidney disease, with long-term current use of insulin ([place]) - Patient has currently uncontrolled symptomatic type 2 DM since  66 years of age,  with recent A1c of  9% increasing from 7.9%, after generally improved from 10.4%.  his diabetes is complicated by mild  CKD  and patient remains at a high risk for more acute and chronic complications of diabetes which include CAD, CVA, CKD, retinopathy, and neuropathy. These are all discussed in detail with the patient.  - I have counseled the  patient on diet management and weight loss, by adopting a carbohydrate restricted/protein rich diet.  - Suggestion is made for patient to avoid simple carbohydrates   from their diet including Cakes , Desserts, Ice Cream,  Soda (  diet and regular) , Sweet Tea , Candies,  Chips, Cookies, Artificial Sweeteners,   and "Sugar-free" Products . This will help patient to have stable blood glucose profile and potentially avoid unintended weight gain.  - I encouraged the patient to switch to  unprocessed or minimally processed complex starch and increased protein intake (animal or plant source), fruits, and vegetables.  - Patient is advised to stick to a routine mealtimes to eat 3 meals  a day and avoid unnecessary snacks ( to snack only to correct hypoglycemia).  - The patient will be scheduled with Jearld Fenton, RDN, CDE for individualized DM education.  - I have approached patient with the following individualized plan to manage diabetes and patient agrees:   - Compared to his last visit, he is blood glucose readings are significantly higher, despite large dose of insulin in the form of Lantus and NovoLog. - He is awaiting his weight loss surgery.  - I  Will increase Lantus to 90 units daily at bedtime, associated with strict monitoring of glucose daily before meals and at bedtime. -I  will increase NovoLog to 20 units  3 times a day before meals, plus correction for unexpected hyperglycemia above 150 g/dL.  -Patient is encouraged to call clinic for blood glucose levels less than 70 or above 200 mg /dl.  -His GFR is 45, I will continue low-dose metformin 500 mg by mouth twice a day.  -Reportedly, he did not tolerate Trulicity . - The low-dose dexamethasone suppression test to screen for endogenous steroid burden, is negative.  - Patient specific target  A1c;  LDL, HDL, Triglycerides, and  Waist Circumference were discussed in detail.  2) BP/HTN: uncontrolled. Continue current medications  including ACEI/ARB. 3) Lipids/HPL:  Uncontrolled, triglycerides 267 LDL is 74  continue statins, TriCor and , omega-3 fatty acids.   4)  Weight/Diet: CDE Consult will be initiated , exercise, and detailed carbohydrates information provided. - He is approved and awaiting for bariatric surgery. 5) Chronic Care/Health Maintenance:  -Patient is on ACEI/ARB and Statin medications and encouraged to continue to follow up with Ophthalmology, Podiatrist at least yearly or according to recommendations, and advised to   stay away from smoking. I have recommended yearly flu vaccine and pneumonia vaccination at least every 5 years; moderate intensity exercise for up to 150 minutes weekly; and  sleep for at least 7 hours a day.  - 25 minutes of time was spent on the care of this patient , 50% of which was applied for counseling on diabetes complications and their preventions. - I advised patient to maintain  close follow up with Orpah Greek, MD for primary care needs.  Follow up plan: - Return in about 4 weeks (around 05/14/2016) for follow up with meter and logs- no labs.  Glade Lloyd, MD Phone: [phone]  Fax: [phone]   04/16/2016, 2:05 PM

## 2016-04-18 DIAGNOSIS — H40023 Open angle with borderline findings, high risk, bilateral: Secondary | ICD-10-CM | POA: Diagnosis not present

## 2016-04-25 ENCOUNTER — Ambulatory Visit (HOSPITAL_COMMUNITY)
Admission: RE | Admit: 2016-04-25 | Discharge: 2016-04-25 | Disposition: A | Discharge status: Home / Self Care | Payer: Medicare Other | Source: Ambulatory Visit | Attending: Surgery | Admitting: Surgery

## 2016-04-25 ENCOUNTER — Other Ambulatory Visit (HOSPITAL_COMMUNITY)
Admission: RE | Admit: 2016-04-25 | Discharge: 2016-04-25 | Disposition: A | Discharge status: Home / Self Care | Payer: Medicare Other | Source: Ambulatory Visit | Attending: Surgery | Admitting: Surgery

## 2016-04-25 ENCOUNTER — Other Ambulatory Visit: Payer: Self-pay

## 2016-04-25 DIAGNOSIS — R05 Cough: Secondary | ICD-10-CM | POA: Diagnosis not present

## 2016-04-25 DIAGNOSIS — J4540 Moderate persistent asthma, uncomplicated: Secondary | ICD-10-CM | POA: Diagnosis not present

## 2016-04-25 DIAGNOSIS — E785 Hyperlipidemia, unspecified: Secondary | ICD-10-CM | POA: Insufficient documentation

## 2016-04-25 DIAGNOSIS — E119 Type 2 diabetes mellitus without complications: Secondary | ICD-10-CM | POA: Diagnosis not present

## 2016-04-25 DIAGNOSIS — Z01818 Encounter for other preprocedural examination: Secondary | ICD-10-CM | POA: Diagnosis not present

## 2016-04-25 DIAGNOSIS — Z9889 Other specified postprocedural states: Secondary | ICD-10-CM | POA: Diagnosis not present

## 2016-04-25 DIAGNOSIS — I10 Essential (primary) hypertension: Secondary | ICD-10-CM | POA: Diagnosis not present

## 2016-04-25 LAB — IRON: IRON: 68 ug/dL (ref 45–182)

## 2016-04-25 LAB — URINALYSIS, ROUTINE W REFLEX MICROSCOPIC
BILIRUBIN URINE: NEGATIVE
GLUCOSE, UA: NEGATIVE mg/dL
Hgb urine dipstick: NEGATIVE
KETONES UR: NEGATIVE mg/dL
Leukocytes, UA: NEGATIVE
NITRITE: NEGATIVE
PH: 6 (ref 5.0–8.0)
Protein, ur: NEGATIVE mg/dL
SPECIFIC GRAVITY, URINE: 1.015 (ref 1.005–1.030)

## 2016-04-25 LAB — TSH: TSH: 2.344 u[IU]/mL (ref 0.350–4.500)

## 2016-04-25 LAB — COMPREHENSIVE METABOLIC PANEL
ALBUMIN: 4.2 g/dL (ref 3.5–5.0)
ALT: 20 U/L (ref 17–63)
AST: 24 U/L (ref 15–41)
Alkaline Phosphatase: 48 U/L (ref 38–126)
Anion gap: 9 (ref 5–15)
BILIRUBIN TOTAL: 1.1 mg/dL (ref 0.3–1.2)
BUN: 29 mg/dL — ABNORMAL HIGH (ref 6–20)
CALCIUM: 9.4 mg/dL (ref 8.9–10.3)
CO2: 27 mmol/L (ref 22–32)
CREATININE: 1.53 mg/dL — AB (ref 0.61–1.24)
Chloride: 104 mmol/L (ref 101–111)
GFR calc Af Amer: 53 mL/min — ABNORMAL LOW (ref >60)
GFR calc non Af Amer: 46 mL/min — ABNORMAL LOW (ref >60)
GLUCOSE: 110 mg/dL — AB (ref 65–99)
Potassium: 4 mmol/L (ref 3.5–5.1)
SODIUM: 140 mmol/L (ref 135–145)
TOTAL PROTEIN: 7.9 g/dL (ref 6.5–8.1)

## 2016-04-25 LAB — CBC WITH DIFFERENTIAL/PLATELET
BASOS PCT: 1 %
Basophils Absolute: 0 10*3/uL (ref 0.0–0.1)
EOS ABS: 0.2 10*3/uL (ref 0.0–0.7)
Eosinophils Relative: 2 %
HEMATOCRIT: 43.8 % (ref 39.0–52.0)
Hemoglobin: 14.4 g/dL (ref 13.0–17.0)
LYMPHS ABS: 2.3 10*3/uL (ref 0.7–4.0)
Lymphocytes Relative: 27 %
MCH: 28 pg (ref 26.0–34.0)
MCHC: 32.9 g/dL (ref 30.0–36.0)
MCV: 85.2 fL (ref 78.0–100.0)
Monocytes Absolute: 0.4 10*3/uL (ref 0.1–1.0)
Monocytes Relative: 5 %
NEUTROS ABS: 5.8 10*3/uL (ref 1.7–7.7)
NEUTROS PCT: 67 %
Platelets: 244 10*3/uL (ref 150–400)
RBC: 5.14 MIL/uL (ref 4.22–5.81)
RDW: 13.6 % (ref 11.5–15.5)
WBC: 8.7 10*3/uL (ref 4.0–10.5)

## 2016-04-25 LAB — PROTIME-INR
INR: 1.02
Prothrombin Time: 13.4 seconds (ref 11.4–15.2)

## 2016-04-25 LAB — LIPID PANEL
CHOL/HDL RATIO: 3.9 ratio
Cholesterol: 150 mg/dL (ref 0–200)
HDL: 38 mg/dL — AB (ref >40)
LDL Cholesterol: 85 mg/dL (ref 0–99)
Triglycerides: 133 mg/dL (ref <150)
VLDL: 27 mg/dL (ref 0–40)

## 2016-04-25 LAB — VITAMIN B12: Vitamin B-12: 709 pg/mL (ref 180–914)

## 2016-04-26 LAB — HELICOBACTER PYLORI ABS-IGG+IGA, BLD: H. PYLOGI, IGA ABS: 26.1 U — AB (ref 0.0–8.9)

## 2016-04-26 LAB — HEMOGLOBIN A1C
HEMOGLOBIN A1C: 9.3 % — AB (ref 4.8–5.6)
MEAN PLASMA GLUCOSE: 220 mg/dL

## 2016-04-26 LAB — T3: T3, Total: 92 ng/dL (ref 71–180)

## 2016-04-30 DIAGNOSIS — E669 Obesity, unspecified: Secondary | ICD-10-CM | POA: Diagnosis not present

## 2016-04-30 DIAGNOSIS — E119 Type 2 diabetes mellitus without complications: Secondary | ICD-10-CM | POA: Diagnosis not present

## 2016-04-30 DIAGNOSIS — K210 Gastro-esophageal reflux disease with esophagitis: Secondary | ICD-10-CM | POA: Diagnosis not present

## 2016-04-30 DIAGNOSIS — G4733 Obstructive sleep apnea (adult) (pediatric): Secondary | ICD-10-CM | POA: Diagnosis not present

## 2016-05-09 DIAGNOSIS — J4540 Moderate persistent asthma, uncomplicated: Secondary | ICD-10-CM | POA: Diagnosis not present

## 2016-05-09 DIAGNOSIS — R05 Cough: Secondary | ICD-10-CM | POA: Diagnosis not present

## 2016-05-10 DIAGNOSIS — J309 Allergic rhinitis, unspecified: Secondary | ICD-10-CM | POA: Diagnosis not present

## 2016-05-11 DIAGNOSIS — I371 Nonrheumatic pulmonary valve insufficiency: Secondary | ICD-10-CM | POA: Diagnosis not present

## 2016-05-11 DIAGNOSIS — I361 Nonrheumatic tricuspid (valve) insufficiency: Secondary | ICD-10-CM | POA: Diagnosis not present

## 2016-05-11 DIAGNOSIS — Z01810 Encounter for preprocedural cardiovascular examination: Secondary | ICD-10-CM | POA: Diagnosis not present

## 2016-05-11 DIAGNOSIS — I517 Cardiomegaly: Secondary | ICD-10-CM | POA: Diagnosis not present

## 2016-05-14 ENCOUNTER — Ambulatory Visit (INDEPENDENT_AMBULATORY_CARE_PROVIDER_SITE_OTHER): Payer: Medicare Other | Admitting: Psychology

## 2016-05-15 DIAGNOSIS — Z01810 Encounter for preprocedural cardiovascular examination: Secondary | ICD-10-CM | POA: Diagnosis not present

## 2016-05-15 DIAGNOSIS — R0602 Shortness of breath: Secondary | ICD-10-CM | POA: Diagnosis not present

## 2016-05-16 ENCOUNTER — Other Ambulatory Visit: Payer: Self-pay | Admitting: "Endocrinology

## 2016-05-18 ENCOUNTER — Ambulatory Visit (INDEPENDENT_AMBULATORY_CARE_PROVIDER_SITE_OTHER): Payer: Medicare Other | Admitting: Psychology

## 2016-05-21 ENCOUNTER — Encounter: Payer: Self-pay | Admitting: Skilled Nursing Facility1

## 2016-05-21 ENCOUNTER — Encounter: Payer: Medicare Other | Attending: Surgery | Admitting: Skilled Nursing Facility1

## 2016-05-21 DIAGNOSIS — I10 Essential (primary) hypertension: Secondary | ICD-10-CM | POA: Insufficient documentation

## 2016-05-21 DIAGNOSIS — E1122 Type 2 diabetes mellitus with diabetic chronic kidney disease: Secondary | ICD-10-CM

## 2016-05-21 DIAGNOSIS — N183 Chronic kidney disease, stage 3 (moderate): Secondary | ICD-10-CM

## 2016-05-21 DIAGNOSIS — E119 Type 2 diabetes mellitus without complications: Secondary | ICD-10-CM | POA: Diagnosis not present

## 2016-05-21 DIAGNOSIS — E1165 Type 2 diabetes mellitus with hyperglycemia: Secondary | ICD-10-CM

## 2016-05-21 DIAGNOSIS — IMO0002 Reserved for concepts with insufficient information to code with codable children: Secondary | ICD-10-CM

## 2016-05-21 DIAGNOSIS — E785 Hyperlipidemia, unspecified: Secondary | ICD-10-CM | POA: Diagnosis not present

## 2016-05-21 DIAGNOSIS — Z794 Long term (current) use of insulin: Secondary | ICD-10-CM

## 2016-05-21 DIAGNOSIS — Z713 Dietary counseling and surveillance: Secondary | ICD-10-CM | POA: Diagnosis not present

## 2016-05-21 NOTE — Progress Notes (Signed)
Pre-Op Assessment Visit:  Pre-Operative RYGB Surgery  Medical Nutrition Therapy:  Appt start time: 2:00  End time:  2:52  Patient was seen on 05/21/2016 for Pre-Operative Nutrition Assessment. Assessment and letter of approval faxed to [facility] Surgery Bariatric Surgery Program coordinator on 05/21/2016.   Pt was warned about his BMI. Pt states he has had a few low blood sugars.   Pt expectation of surgery: "hoping for reducing medications and their amounts and extend my life"  Pt expectation of Dietitian: " to give support and guidance for food choices"   Start weight at [facility]: 250.7 BMI: 35.97   24 hr Dietary Recall: First Meal: eggs and bacon----bacon and egg sandwich Snack:  Second Meal: none Snack:  Third Meal: pork chop, vegetable, baked potato Snack:  Beverages: unsweet tea, diet soda, water, coffee  Encouraged to engage in 150 minutes of moderate physical activity including cardiovascular and weight baring weekly  Handouts given during visit include:  . Pre-Op Goals . Bariatric Surgery Protein Shakes During the appointment today the following Pre-Op Goals were reviewed with the patient: . Maintain or lose weight as instructed by your surgeon . Make healthy food choices . Begin to limit portion sizes . Limited concentrated sugars and fried foods . Keep fat/sugar in the single digits per serving on             food labels . Practice CHEWING your food  (aim for 30 chews per bite or until applesauce consistency) . Practice not drinking 15 minutes before, during, and 30 minutes after each meal/snack . Avoid all carbonated beverages  . Avoid/limit caffeinated beverages  . Avoid all sugar-sweetened beverages . Consume 3 meals per day; eat every 3-5 hours . Make a list of non-food related activities . Aim for 64-100 ounces of FLUID daily  . Aim for at least 60-80 grams of PROTEIN daily . Look for a liquid protein source that contain ?15 g protein and ?5 g  carbohydrate  (ex: shakes, drinks, shots)  -Follow diet recommendations listed below   Energy and Macronutrient Recomendations: Calories: 1800 Carbohydrate: 200 Protein: 135 Fat: 50  Demonstrated degree of understanding via:  Teach Back  Teaching Method Utilized:  Visual Auditory Hands on  Barriers to learning/adherence to lifestyle change: none identified   Patient to call the [facility] to enroll in Pre-Op and Post-Op Nutrition Education when surgery date is scheduled.

## 2016-05-21 NOTE — Patient Instructions (Signed)
Follow Pre-Op Goals Try Protein Shakes Call [facility] at [phone] when surgery is scheduled to enroll in Pre-Op Class  Things to remember:  Please always be honest with [name]. We want to support you!  If you have any questions or concerns in between appointments, please call or email   The diet after surgery will be high protein and low in carbohydrate.  Vitamins and calcium need to be taken for the rest of your life.  Feel free to include support people in any classes or appointments.   Supplement recommendations:  Before Surgery   1 Complete Multivitamin with Iron  3000 IU Vitamin D3  After Surgery   2 Chewable Multivitamins  **Best Choice - Opurity: Bypass and Sleeve Optimized Chewable    Bariatric Advantage Advanced Multi EA      3 Chewable Calcium (500 mg each, total 1200-1500 mg per day)  **Best Choice - Celebrate, Bariatric Advantage, or Wellesse  Other Options:  2 Celebrate MultiComplete with 18 mg Iron (this provides 6000 IU of  Vitamin D3)  4 Celebrate Essential Multi 2 in 1 (has calcium) + 18-60 mg separate  iron  Vitamins and Calcium are available at:   [facility]   [place], [place], [place] [address]   [email or web address]  www.[email or web address]  www.[email or web address]

## 2016-05-23 DIAGNOSIS — J4540 Moderate persistent asthma, uncomplicated: Secondary | ICD-10-CM | POA: Diagnosis not present

## 2016-05-23 DIAGNOSIS — R05 Cough: Secondary | ICD-10-CM | POA: Diagnosis not present

## 2016-05-23 NOTE — Patient Instructions (Addendum)
[place]  05/23/2016   Your procedure is scheduled on: 06/04/16  Report to [facility] Main  Entrance Take [facility]  elevators to 3rd floor to  [place] at      1030AM.    Call this number if you have problems the morning of surgery [phone]    Remember: ONLY 1 PERSON MAY GO WITH YOU TO SHORT STAY TO GET  READY MORNING OF YOUR SURGERY.  Do not eat food or drink liquids :After Midnight.     Take these medicines the morning of surgery with A SIP OF WATER: nasonex, metoprolol, levocetercine (xyzal)amlodipine, inhaler and bring DO NOT TAKE ANY oral  DIABETIC MEDICATIONS DAY OF YOUR SURGERY                               You may not have any metal on your body including hair pins and              piercings  Do not wear jewelry,lotions powders perfumes or deodarant  ,How to Manage Your Diabetes Before and After Surgery  Why is it important to control my blood sugar before and after surgery? . Improving blood sugar levels before and after surgery helps healing and can limit problems. . A way of improving blood sugar control is eating a healthy diet by: o  Eating less sugar and carbohydrates o  Increasing activity/exercise o  Talking with your doctor about reaching your blood sugar goals . High blood sugars (greater than 180 mg/dL) can raise your risk of infections and slow your recovery, so you will need to focus on controlling your diabetes during the weeks before surgery. . Make sure that the doctor who takes care of your diabetes knows about your planned surgery including the date and location.  How do I manage my blood sugar before surgery? . Check your blood sugar at least 4 times a day, starting 2 days before surgery, to make sure that the level is not too high or low. o Check your blood sugar the morning of your surgery when you wake up and every 2 hours until you get to the Short Stay unit. . If your blood sugar is less than 70 mg/dL, you  will need to treat for low blood sugar: o Do not take insulin. o Treat a low blood sugar (less than 70 mg/dL) with  cup of clear juice (cranberry or apple), 4 glucose tablets, OR glucose gel. o Recheck blood sugar in 15 minutes after treatment (to make sure it is greater than 70 mg/dL). If your blood sugar is not greater than 70 mg/dL on recheck, call [phone] for further instructions. . Report your blood sugar to the short stay nurse when you get to Short Stay.  . If you are admitted to the hospital after surgery: o Your blood sugar will be checked by the staff and you will probably be given insulin after surgery (instead of oral diabetes medicines) to make sure you have good blood sugar levels. o The goal for blood sugar control after surgery is 80-180 mg/dL.   WHAT DO I DO ABOUT MY DIABETES MEDICATION?  Marland Kitchen Do not take oral diabetes medicines (pills) the morning of surgery.  . THE NIGHT BEFORE SURGERY, take    50 %   of  lantus     insulin.  dose          Day before take usual ss Novolog with meals no bedtime dose  . THE MORNING OF SURGERY  . The day of surgery, do not take other diabetes injectables, including Byetta (exenatide), Bydureon (exenatide ER), Victoza (liraglutide), or Trulicity (dulaglutide) . Marland Kitchen THE MORNING OF SURGERY . If your CBG is greater than 220 mg/dL, you may take  of your sliding scale  . (correction) dose of insulin.      Patient Signature:  Date:   Nurse Signature:  Date:   Reviewed and Endorsed by [facility] Patient Education Committee, August 2015    Do not bring valuables to the hospital. [place].  Contacts, dentures or bridgework may not be worn into surgery.  Leave suitcase in the car. After surgery it may be brought to your room.    Special Instructions: N/A              Please read over the following fact sheets you were  given: _____________________________________________________________________             [facility] - Preparing for Surgery Before surgery, you can play an important role.  Because skin is not sterile, your skin needs to be as free of germs as possible.  You can reduce the number of germs on your skin by washing with CHG (chlorahexidine gluconate) soap before surgery.  CHG is an antiseptic cleaner which kills germs and bonds with the skin to continue killing germs even after washing. Please DO NOT use if you have an allergy to CHG or antibacterial soaps.  If your skin becomes reddened/irritated stop using the CHG and inform your nurse when you arrive at Short Stay. Do not shave (including legs and underarms) for at least 48 hours prior to the first CHG shower.  You may shave your face/neck. Please follow these instructions carefully:  1.  Shower with CHG Soap the night before surgery and the  morning of Surgery.  2.  If you choose to wash your hair, wash your hair first as usual with your  normal  shampoo.  3.  After you shampoo, rinse your hair and body thoroughly to remove the  shampoo.                           4.  Use CHG as you would any other liquid soap.  You can apply chg directly  to the skin and wash                       Gently with a scrungie or clean washcloth.   Incentive Spirometer  An incentive spirometer is a tool that can help keep your lungs clear and active. This tool measures how well you are filling your lungs with each breath. Taking long deep breaths may help reverse or decrease the chance of developing breathing (pulmonary) problems (especially infection) following:  A long period of time when you are unable to move or be active. BEFORE THE PROCEDURE   If the spirometer includes an indicator to show your best effort, your nurse or respiratory therapist will set it to a desired goal.  If possible, sit up straight or lean slightly forward. Try not to slouch.  Hold  the incentive spirometer in an upright position. INSTRUCTIONS FOR USE  1.  Sit on the edge of your bed if possible, or sit up as far as you can in bed or on a chair. 2. Hold the incentive spirometer in an upright position. 3. Breathe out normally. 4. Place the mouthpiece in your mouth and seal your lips tightly around it. 5. Breathe in slowly and as deeply as possible, raising the piston or the ball toward the top of the column. 6. Hold your breath for 3-5 seconds or for as long as possible. Allow the piston or ball to fall to the bottom of the column. 7. Remove the mouthpiece from your mouth and breathe out normally. 8. Rest for a few seconds and repeat Steps 1 through 7 at least 10 times every 1-2 hours when you are awake. Take your time and take a few normal breaths between deep breaths. 9. The spirometer may include an indicator to show your best effort. Use the indicator as a goal to work toward during each repetition. 10. After each set of 10 deep breaths, practice coughing to be sure your lungs are clear. If you have an incision (the cut made at the time of surgery), support your incision when coughing by placing a pillow or rolled up towels firmly against it. Once you are able to get out of bed, walk around indoors and cough well. You may stop using the incentive spirometer when instructed by your caregiver.  RISKS AND COMPLICATIONS  Take your time so you do not get dizzy or light-headed.  If you are in pain, you may need to take or ask for pain medication before doing incentive spirometry. It is harder to take a deep breath if you are having pain. AFTER USE  Rest and breathe slowly and easily.  It can be helpful to keep track of a log of your progress. Your caregiver can provide you with a simple table to help with this. If you are using the spirometer at home, follow these instructions: [place] IF:   You are having difficultly using the spirometer.  You have trouble  using the spirometer as often as instructed.  Your pain medication is not giving enough relief while using the spirometer.  You develop fever of 100.5 F (38.1 C) or higher. SEEK IMMEDIATE MEDICAL CARE IF:   You cough up bloody sputum that had not been present before.  You develop fever of 102 F (38.9 C) or greater.  You develop worsening pain at or near the incision site. MAKE SURE YOU:   Understand these instructions.  Will watch your condition.  Will get help right away if you are not doing well or get worse. Document Released: 05/21/2006 Document Revised: 04/02/2011 Document Reviewed: 07/22/2006 [facility] Patient Information 2014 ExitCare, [place].   ________________________________________________________________________  5.  Apply the CHG Soap to your body ONLY FROM THE NECK DOWN.   Do not use on face/ open                           Wound or open sores. Avoid contact with eyes, ears mouth and genitals (private parts).                       Wash face,  Genitals (private parts) with your normal soap.             6.  Wash thoroughly, paying special attention to the area where your surgery  will be performed.  7.  Thoroughly rinse  your body with warm water from the neck down.  8.  DO NOT shower/wash with your normal soap after using and rinsing off  the CHG Soap.                9.  Pat yourself dry with a clean towel.            10.  Wear clean pajamas.            11.  Place clean sheets on your bed the night of your first shower and do not  sleep with pets. Day of Surgery : Do not apply any lotions/deodorants the morning of surgery.  Please wear clean clothes to the hospital/surgery center.  FAILURE TO FOLLOW THESE INSTRUCTIONS MAY RESULT IN THE CANCELLATION OF YOUR SURGERY PATIENT SIGNATURE_________________________________  NURSE SIGNATURE__________________________________  ________________________________________________________________________

## 2016-05-24 ENCOUNTER — Ambulatory Visit: Payer: Self-pay | Admitting: Surgery

## 2016-05-24 NOTE — H&P (Signed)
[place] Patient #: [id number] DOB: Jan 21, 1951 Married / Language: English / Race: White Male   History of Present Illness ( Patient words: This is a very nice 66 year old gentleman who is here with his wife for evaluation for bariatric surgery. He has been considering surgery and following with his endocrinologist and primary care as well as a nutritionist for the last year trying to get approved for bariatric surgery. He is tried low-calorie and low carbohydrate diets with limited success in the past. He is interested in the Roux-en-Y gastric bypass due to most importantly insulin-dependent diabetes but additionally he has several other obesity related comorbidities.  The patient is a 66 year old male who presents for a bariatric surgery evaluation. Associated symptoms include joint pains and heartburn. Initial presentation included inability to lose weight, hypertension and diabetes. Disease complications include diabetes, hypertension, hyperlipidemia and sleep apnea. Current diet includes low carbohydrate food choices. walking (golfs). The patient is currently able to do activities of daily living without limitations, able to work without limitations and able to do housework without limitations. Past evaluation has included body mass index, dietician consultation, sleep study and endocrinology consultation. Past treatment has included low calorie diet and diabetic diet. Surgical history: cholecystectomy. Cardiac history: The patient has sleep apnea, and patient denies smoking. Gastrointestinal History: Patient has heartburn, and denies dysphagia. MBSQIP recognized comorbidities: Patient has hypertension, hyperlipidemia, diabetes mellitus, sleep apnea and gastroesophageal reflux disease.    Review of Systems  All other systems negative   Physical Exam  General Mental Status-Alert. General Appearance-Cooperative. Orientation-Oriented X4. Build &  Nutrition-Obese. Posture-Normal posture.  Integumentary Global Assessment Normal Exam - Head/Face: no rashes, ulcers, lesions or evidence of photo damage. No palpable nodules or masses and Neck: no visible lesions or palpable masses.  Head and Neck Head-normocephalic, atraumatic with no lesions or palpable masses. Face Global Assessment - atraumatic. Thyroid Gland Characteristics - normal size and consistency.  Eye Eyeball - Bilateral-Extraocular movements intact. Sclera/Conjunctiva - Bilateral-No scleral icterus, No Discharge.  ENMT Nose and Sinuses Nose - no deformities observed, no swelling present.  Chest and Lung Exam Palpation Normal exam - Non-tender. Auscultation Breath sounds - Normal.  Cardiovascular Auscultation Rhythm - Regular. Heart Sounds - S1 WNL and S2 WNL. Carotid arteries - No Carotid bruit.  Abdomen Inspection Normal Exam - No Visible peristalsis, No Abnormal pulsations and No Paradoxical movements. Palpation/Percussion Normal exam - Soft, Non Tender, No Rebound tenderness, No Rigidity (guarding), No hepatosplenomegaly and No Palpable abdominal masses.  Peripheral Vascular Upper Extremity Palpation - Pulses bilaterally normal. Lower Extremity Palpation - Edema - Bilateral - No edema.  Neurologic Neurologic evaluation reveals -normal sensation and normal coordination.  Neuropsychiatric Mental status exam performed with findings of-able to articulate well with normal speech/language, rate, volume and coherence and thought content normal with ability to perform basic computations and apply abstract reasoning.  Musculoskeletal Normal Exam - Bilateral-Upper Extremity Strength Normal and Lower Extremity Strength Normal.    Assessment & Plan  MORBID OBESITY (E66.01) Story: I agree that this patient is a good candidate for Roux-en-Y gastric bypass. I discussed with him the expected physiologic effects and expected weight loss  trajectory. We discussed the expected perioperative and postoperative recovery. Went over the preop workup that will need to be done, he already has a known history of sleep apnea and uses CPAP machine. He and his wife asked many insightful and appropriate questions all of which were answered. They have attended a seminar in January with Dr. Hassell Done  and their comprehension of that information has been excellent. Additionally they have done their own research over the course of the last year while trying to get on board for bariatric surgery. He has received clearance from his cardiologist, Dr. Sabra Heck in [place].

## 2016-05-25 ENCOUNTER — Encounter (HOSPITAL_COMMUNITY): Payer: Self-pay

## 2016-05-25 ENCOUNTER — Encounter (HOSPITAL_COMMUNITY)
Admission: RE | Admit: 2016-05-25 | Discharge: 2016-05-25 | Disposition: A | Discharge status: Home / Self Care | Payer: Medicare Other | Source: Ambulatory Visit | Attending: Surgery | Admitting: Surgery

## 2016-05-25 DIAGNOSIS — Z01812 Encounter for preprocedural laboratory examination: Secondary | ICD-10-CM | POA: Insufficient documentation

## 2016-05-25 HISTORY — DX: Unspecified asthma, uncomplicated: J45.909

## 2016-05-25 HISTORY — DX: Malignant (primary) neoplasm, unspecified: C80.1

## 2016-05-25 HISTORY — DX: Pneumonia, unspecified organism: J18.9

## 2016-05-25 HISTORY — DX: Gastro-esophageal reflux disease without esophagitis: K21.9

## 2016-05-25 HISTORY — DX: Chronic kidney disease, unspecified: N18.9

## 2016-05-25 HISTORY — DX: Unspecified osteoarthritis, unspecified site: M19.90

## 2016-05-25 HISTORY — DX: Sleep apnea, unspecified: G47.30

## 2016-05-25 LAB — COMPREHENSIVE METABOLIC PANEL
ALT: 17 U/L (ref 17–63)
ANION GAP: 9 (ref 5–15)
AST: 22 U/L (ref 15–41)
Albumin: 4 g/dL (ref 3.5–5.0)
Alkaline Phosphatase: 55 U/L (ref 38–126)
BUN: 30 mg/dL — ABNORMAL HIGH (ref 6–20)
CHLORIDE: 104 mmol/L (ref 101–111)
CO2: 28 mmol/L (ref 22–32)
Calcium: 9.5 mg/dL (ref 8.9–10.3)
Creatinine, Ser: 1.62 mg/dL — ABNORMAL HIGH (ref 0.61–1.24)
GFR calc Af Amer: 49 mL/min — ABNORMAL LOW (ref >60)
GFR calc non Af Amer: 43 mL/min — ABNORMAL LOW (ref >60)
Glucose, Bld: 204 mg/dL — ABNORMAL HIGH (ref 65–99)
POTASSIUM: 4.2 mmol/L (ref 3.5–5.1)
SODIUM: 141 mmol/L (ref 135–145)
Total Bilirubin: 0.6 mg/dL (ref 0.3–1.2)
Total Protein: 7.2 g/dL (ref 6.5–8.1)

## 2016-05-25 LAB — CBC WITH DIFFERENTIAL/PLATELET
Basophils Absolute: 0 10*3/uL (ref 0.0–0.1)
Basophils Relative: 0 %
EOS ABS: 0.2 10*3/uL (ref 0.0–0.7)
EOS PCT: 2 %
HCT: 42.5 % (ref 39.0–52.0)
Hemoglobin: 13.9 g/dL (ref 13.0–17.0)
LYMPHS ABS: 2.4 10*3/uL (ref 0.7–4.0)
Lymphocytes Relative: 26 %
MCH: 28 pg (ref 26.0–34.0)
MCHC: 32.7 g/dL (ref 30.0–36.0)
MCV: 85.5 fL (ref 78.0–100.0)
Monocytes Absolute: 0.5 10*3/uL (ref 0.1–1.0)
Monocytes Relative: 5 %
Neutro Abs: 5.9 10*3/uL (ref 1.7–7.7)
Neutrophils Relative %: 67 %
PLATELETS: 222 10*3/uL (ref 150–400)
RBC: 4.97 MIL/uL (ref 4.22–5.81)
RDW: 13.6 % (ref 11.5–15.5)
WBC: 8.9 10*3/uL (ref 4.0–10.5)

## 2016-05-25 LAB — GLUCOSE, CAPILLARY: Glucose-Capillary: 206 mg/dL — ABNORMAL HIGH (ref 65–99)

## 2016-05-25 NOTE — Progress Notes (Signed)
05/15/16 lov cards and lov 03/19/16 on chart ekg 04/25/16 epic 3/18 DM Dr. Cassell Clement in epic Stress test 05/15/16 chart

## 2016-05-25 NOTE — Progress Notes (Signed)
Cmp done 05/25/16 routed to Dr. Kae Heller via epic

## 2016-05-25 NOTE — Progress Notes (Signed)
[email or web address] from 04/25/16 routed to Dr. Kae Heller via epic

## 2016-05-28 ENCOUNTER — Encounter: Payer: Medicare Other | Attending: Surgery | Admitting: Registered"

## 2016-05-28 DIAGNOSIS — Z713 Dietary counseling and surveillance: Secondary | ICD-10-CM | POA: Insufficient documentation

## 2016-05-28 DIAGNOSIS — E785 Hyperlipidemia, unspecified: Secondary | ICD-10-CM | POA: Insufficient documentation

## 2016-05-28 DIAGNOSIS — E669 Obesity, unspecified: Secondary | ICD-10-CM

## 2016-05-28 DIAGNOSIS — E119 Type 2 diabetes mellitus without complications: Secondary | ICD-10-CM | POA: Insufficient documentation

## 2016-05-28 DIAGNOSIS — I10 Essential (primary) hypertension: Secondary | ICD-10-CM | POA: Diagnosis not present

## 2016-05-28 NOTE — Progress Notes (Signed)
  Pre-Operative Nutrition Class:  Appt start time: 8:15   End time:  9:15  Patient was seen on 05/28/2016 for Pre-Operative Bariatric Surgery Education at [facility].   Surgery date: 06/04/2016 Surgery type: RYGB Start weight at [facility]: 250.7 Weight today: 255.7   Samples given per MNT protocol. Patient educated on appropriate usage: Opurity Multivitamin Lot # [id number] Exp: 04/19  Bariatric Advantage Calcium Citrate (strawberry) Lot #[id number] Exp: 07/11/16  Bariatric Advantage Calcium Citrate (caramel) Lot # [id number] Exp: 08/03/16  [place]jury Protein Shake Lot # [id number] Exp: 11/25/16  The following the learning objectives were met by the patient during this course:  Identify Pre-Op Dietary Goals and will begin 2 weeks pre-operatively  Identify appropriate sources of fluids and proteins   State protein recommendations and appropriate sources pre and post-operatively  Identify Post-Operative Dietary Goals and will follow for 2 weeks post-operatively  Identify appropriate multivitamin and calcium sources  Describe the need for physical activity post-operatively and will follow MD recommendations  State when to call healthcare provider regarding medication questions or post-operative complications  Handouts given during class include:  Pre-Op Bariatric Surgery Diet Handout  Protein Shake Handout  Post-Op Bariatric Surgery Nutrition Handout  [facility] Program Information Flyer  Support Group Information Flyer  [facility] Bariatric Supplements Price List  Follow-Up Plan: Patient will follow-up at [facility] 2 weeks post operatively for diet advancement per MD.

## 2016-06-04 ENCOUNTER — Ambulatory Visit: Payer: Medicare Other

## 2016-06-04 ENCOUNTER — Encounter (HOSPITAL_COMMUNITY): Payer: Self-pay | Admitting: *Deleted

## 2016-06-04 ENCOUNTER — Inpatient Hospital Stay (HOSPITAL_COMMUNITY): Payer: Medicare Other | Admitting: Anesthesiology

## 2016-06-04 ENCOUNTER — Encounter (HOSPITAL_COMMUNITY)
Admission: RE | Disposition: A | Discharge status: Home / Self Care | Payer: Self-pay | Source: Ambulatory Visit | Attending: Surgery

## 2016-06-04 ENCOUNTER — Inpatient Hospital Stay (HOSPITAL_COMMUNITY)
Admission: RE | Admit: 2016-06-04 | Discharge: 2016-06-06 | DRG: 621 | Disposition: A | Discharge status: Home / Self Care | Payer: Medicare Other | Source: Ambulatory Visit | Attending: Surgery | Admitting: Surgery

## 2016-06-04 DIAGNOSIS — Z6836 Body mass index (BMI) 36.0-36.9, adult: Secondary | ICD-10-CM | POA: Diagnosis not present

## 2016-06-04 DIAGNOSIS — Z794 Long term (current) use of insulin: Secondary | ICD-10-CM

## 2016-06-04 DIAGNOSIS — E785 Hyperlipidemia, unspecified: Secondary | ICD-10-CM | POA: Diagnosis not present

## 2016-06-04 DIAGNOSIS — E1165 Type 2 diabetes mellitus with hyperglycemia: Secondary | ICD-10-CM | POA: Diagnosis present

## 2016-06-04 DIAGNOSIS — K219 Gastro-esophageal reflux disease without esophagitis: Secondary | ICD-10-CM | POA: Diagnosis not present

## 2016-06-04 DIAGNOSIS — N183 Chronic kidney disease, stage 3 (moderate): Secondary | ICD-10-CM | POA: Diagnosis present

## 2016-06-04 DIAGNOSIS — E1122 Type 2 diabetes mellitus with diabetic chronic kidney disease: Secondary | ICD-10-CM | POA: Diagnosis present

## 2016-06-04 DIAGNOSIS — E1169 Type 2 diabetes mellitus with other specified complication: Secondary | ICD-10-CM | POA: Diagnosis not present

## 2016-06-04 DIAGNOSIS — I129 Hypertensive chronic kidney disease with stage 1 through stage 4 chronic kidney disease, or unspecified chronic kidney disease: Secondary | ICD-10-CM | POA: Diagnosis not present

## 2016-06-04 DIAGNOSIS — I10 Essential (primary) hypertension: Secondary | ICD-10-CM | POA: Diagnosis not present

## 2016-06-04 DIAGNOSIS — G4730 Sleep apnea, unspecified: Secondary | ICD-10-CM | POA: Diagnosis not present

## 2016-06-04 DIAGNOSIS — E119 Type 2 diabetes mellitus without complications: Secondary | ICD-10-CM | POA: Diagnosis not present

## 2016-06-04 HISTORY — PX: GASTRIC ROUX-EN-Y: SHX5262

## 2016-06-04 LAB — GLUCOSE, CAPILLARY
GLUCOSE-CAPILLARY: 262 mg/dL — AB (ref 65–99)
Glucose-Capillary: 187 mg/dL — ABNORMAL HIGH (ref 65–99)
Glucose-Capillary: 225 mg/dL — ABNORMAL HIGH (ref 65–99)
Glucose-Capillary: 315 mg/dL — ABNORMAL HIGH (ref 65–99)

## 2016-06-04 LAB — HEMOGLOBIN AND HEMATOCRIT, BLOOD
HEMATOCRIT: 42.4 % (ref 39.0–52.0)
HEMOGLOBIN: 13.6 g/dL (ref 13.0–17.0)

## 2016-06-04 SURGERY — LAPAROSCOPIC ROUX-EN-Y GASTRIC BYPASS WITH UPPER ENDOSCOPY
Anesthesia: General | Site: Abdomen

## 2016-06-04 MED ORDER — FLUTICASONE-UMECLIDIN-VILANT 100-62.5-25 MCG/INH IN AEPB: 1.0000 | INHALATION_SPRAY | Freq: Every day | RESPIRATORY_TRACT | Status: DC

## 2016-06-04 MED ORDER — HYDROMORPHONE HCL 1 MG/ML IJ SOLN: 0.2500 mg | INTRAMUSCULAR | Status: DC | PRN

## 2016-06-04 MED ORDER — METOPROLOL TARTRATE 50 MG PO TABS
50.0000 mg | ORAL_TABLET | Freq: Every morning | ORAL | Status: DC
  Administered 2016-06-05 – 2016-06-06 (×2): 50 mg via ORAL
  Filled 2016-06-04 (×2): qty 1

## 2016-06-04 MED ORDER — ONDANSETRON HCL 4 MG/2ML IJ SOLN
INTRAMUSCULAR | Status: DC | PRN
  Administered 2016-06-04: 4 mg via INTRAVENOUS

## 2016-06-04 MED ORDER — EPHEDRINE SULFATE 50 MG/ML IJ SOLN
INTRAMUSCULAR | Status: DC | PRN
  Administered 2016-06-04 (×3): 10 mg via INTRAVENOUS
  Administered 2016-06-04: 5 mg via INTRAVENOUS
  Administered 2016-06-04 (×2): 10 mg via INTRAVENOUS
  Administered 2016-06-04 (×2): 5 mg via INTRAVENOUS

## 2016-06-04 MED ORDER — DEXAMETHASONE SODIUM PHOSPHATE 4 MG/ML IJ SOLN: 4.0000 mg | INTRAMUSCULAR | Status: DC

## 2016-06-04 MED ORDER — EVICEL 5 ML EX KIT
PACK | Freq: Once | CUTANEOUS | Status: AC
  Administered 2016-06-04: 5 mL
  Filled 2016-06-04: qty 1

## 2016-06-04 MED ORDER — DEXAMETHASONE SODIUM PHOSPHATE 10 MG/ML IJ SOLN
INTRAMUSCULAR | Status: DC | PRN
  Administered 2016-06-04: 7 mg via INTRAVENOUS

## 2016-06-04 MED ORDER — DEXAMETHASONE SODIUM PHOSPHATE 10 MG/ML IJ SOLN
INTRAMUSCULAR | Status: AC
  Filled 2016-06-04: qty 1

## 2016-06-04 MED ORDER — PROPOFOL 10 MG/ML IV BOLUS
INTRAVENOUS | Status: DC | PRN
  Administered 2016-06-04: 230 mg via INTRAVENOUS

## 2016-06-04 MED ORDER — APREPITANT 40 MG PO CAPS
40.0000 mg | ORAL_CAPSULE | ORAL | Status: AC
  Administered 2016-06-04: 40 mg via ORAL
  Filled 2016-06-04: qty 1

## 2016-06-04 MED ORDER — OXYCODONE HCL 5 MG/5ML PO SOLN
5.0000 mg | ORAL | Status: DC | PRN
  Administered 2016-06-05 – 2016-06-06 (×3): 5 mg via ORAL
  Filled 2016-06-04 (×3): qty 5

## 2016-06-04 MED ORDER — ALBUTEROL SULFATE HFA 108 (90 BASE) MCG/ACT IN AERS
INHALATION_SPRAY | RESPIRATORY_TRACT | Status: AC
  Filled 2016-06-04: qty 6.7

## 2016-06-04 MED ORDER — ACETAMINOPHEN 160 MG/5ML PO SOLN: 325.0000 mg | ORAL | Status: DC | PRN

## 2016-06-04 MED ORDER — ROCURONIUM BROMIDE 50 MG/5ML IV SOSY
PREFILLED_SYRINGE | INTRAVENOUS | Status: AC
  Filled 2016-06-04: qty 5

## 2016-06-04 MED ORDER — PANTOPRAZOLE SODIUM 40 MG IV SOLR
40.0000 mg | Freq: Every day | INTRAVENOUS | Status: DC
  Administered 2016-06-04 – 2016-06-05 (×2): 40 mg via INTRAVENOUS
  Filled 2016-06-04 (×2): qty 40

## 2016-06-04 MED ORDER — CEFOTETAN DISODIUM-DEXTROSE 2-2.08 GM-% IV SOLR
2.0000 g | INTRAVENOUS | Status: AC
  Administered 2016-06-04: 2 g via INTRAVENOUS
  Filled 2016-06-04: qty 50

## 2016-06-04 MED ORDER — LIDOCAINE 2% (20 MG/ML) 5 ML SYRINGE
INTRAMUSCULAR | Status: AC
  Filled 2016-06-04: qty 15

## 2016-06-04 MED ORDER — LACTATED RINGERS IV SOLN
INTRAVENOUS | Status: DC
  Administered 2016-06-04 (×2): via INTRAVENOUS

## 2016-06-04 MED ORDER — OXYCODONE HCL 5 MG PO TABS: 5.0000 mg | ORAL_TABLET | Freq: Once | ORAL | Status: DC | PRN

## 2016-06-04 MED ORDER — ONDANSETRON HCL 4 MG/2ML IJ SOLN: 4.0000 mg | INTRAMUSCULAR | Status: DC | PRN

## 2016-06-04 MED ORDER — SCOPOLAMINE 1 MG/3DAYS TD PT72
1.0000 | MEDICATED_PATCH | TRANSDERMAL | Status: DC
  Administered 2016-06-04: 1.5 mg via TRANSDERMAL
  Filled 2016-06-04: qty 1

## 2016-06-04 MED ORDER — INSULIN ASPART 100 UNIT/ML ~~LOC~~ SOLN
0.0000 [IU] | SUBCUTANEOUS | Status: DC
  Administered 2016-06-04: 15 [IU] via SUBCUTANEOUS
  Administered 2016-06-04: 11 [IU] via SUBCUTANEOUS
  Administered 2016-06-05: 4 [IU] via SUBCUTANEOUS
  Administered 2016-06-05: 7 [IU] via SUBCUTANEOUS
  Administered 2016-06-05: 11 [IU] via SUBCUTANEOUS
  Administered 2016-06-05: 4 [IU] via SUBCUTANEOUS
  Administered 2016-06-05: 11 [IU] via SUBCUTANEOUS
  Administered 2016-06-05: 7 [IU] via SUBCUTANEOUS
  Administered 2016-06-06 (×2): 4 [IU] via SUBCUTANEOUS
  Administered 2016-06-06: 3 [IU] via SUBCUTANEOUS

## 2016-06-04 MED ORDER — CHLORHEXIDINE GLUCONATE 4 % EX LIQD: 60.0000 mL | Freq: Once | CUTANEOUS | Status: DC

## 2016-06-04 MED ORDER — LACTATED RINGERS IR SOLN
Status: DC | PRN
  Administered 2016-06-04: 1000 mL

## 2016-06-04 MED ORDER — SODIUM CHLORIDE 0.9 % IJ SOLN
INTRAMUSCULAR | Status: AC
  Filled 2016-06-04: qty 50

## 2016-06-04 MED ORDER — SUCCINYLCHOLINE CHLORIDE 20 MG/ML IJ SOLN
INTRAMUSCULAR | Status: DC | PRN
  Administered 2016-06-04: 140 mg via INTRAVENOUS

## 2016-06-04 MED ORDER — SUGAMMADEX SODIUM 500 MG/5ML IV SOLN
INTRAVENOUS | Status: AC
  Filled 2016-06-04: qty 5

## 2016-06-04 MED ORDER — ONDANSETRON HCL 4 MG/2ML IJ SOLN
INTRAMUSCULAR | Status: AC
  Filled 2016-06-04: qty 2

## 2016-06-04 MED ORDER — GLYCOPYRROLATE 0.2 MG/ML IV SOSY
PREFILLED_SYRINGE | INTRAVENOUS | Status: AC
  Filled 2016-06-04: qty 5

## 2016-06-04 MED ORDER — MIDAZOLAM HCL 2 MG/2ML IJ SOLN
INTRAMUSCULAR | Status: AC
  Filled 2016-06-04: qty 2

## 2016-06-04 MED ORDER — LIDOCAINE 2% (20 MG/ML) 5 ML SYRINGE
INTRAMUSCULAR | Status: DC | PRN
  Administered 2016-06-04: 1.5 mg/kg/h via INTRAVENOUS

## 2016-06-04 MED ORDER — FENTANYL CITRATE (PF) 250 MCG/5ML IJ SOLN
INTRAMUSCULAR | Status: AC
  Filled 2016-06-04: qty 5

## 2016-06-04 MED ORDER — ENOXAPARIN SODIUM 30 MG/0.3ML ~~LOC~~ SOLN
30.0000 mg | Freq: Two times a day (BID) | SUBCUTANEOUS | Status: DC
  Administered 2016-06-05 – 2016-06-06 (×3): 30 mg via SUBCUTANEOUS
  Filled 2016-06-04 (×5): qty 0.3

## 2016-06-04 MED ORDER — FENTANYL CITRATE (PF) 100 MCG/2ML IJ SOLN
INTRAMUSCULAR | Status: DC | PRN
  Administered 2016-06-04: 100 ug via INTRAVENOUS
  Administered 2016-06-04: 25 ug via INTRAVENOUS

## 2016-06-04 MED ORDER — SODIUM CHLORIDE 0.9 % IJ SOLN
INTRAMUSCULAR | Status: DC | PRN
  Administered 2016-06-04: 50 mL

## 2016-06-04 MED ORDER — MIDAZOLAM HCL 5 MG/5ML IJ SOLN
INTRAMUSCULAR | Status: DC | PRN
  Administered 2016-06-04: 2 mg via INTRAVENOUS

## 2016-06-04 MED ORDER — OXYCODONE HCL 5 MG/5ML PO SOLN
5.0000 mg | Freq: Once | ORAL | Status: DC | PRN
  Filled 2016-06-04: qty 5

## 2016-06-04 MED ORDER — KETAMINE HCL 10 MG/ML IJ SOLN
INTRAMUSCULAR | Status: AC
Start: 2016-06-04 — End: 2016-06-04
  Filled 2016-06-04: qty 1

## 2016-06-04 MED ORDER — ALBUTEROL SULFATE HFA 108 (90 BASE) MCG/ACT IN AERS
INHALATION_SPRAY | RESPIRATORY_TRACT | Status: DC | PRN
  Administered 2016-06-04: 2 via RESPIRATORY_TRACT

## 2016-06-04 MED ORDER — GLYCOPYRROLATE 0.2 MG/ML IJ SOLN
INTRAMUSCULAR | Status: DC | PRN
  Administered 2016-06-04: 0.2 mg via INTRAVENOUS

## 2016-06-04 MED ORDER — ROCURONIUM BROMIDE 100 MG/10ML IV SOLN
INTRAVENOUS | Status: DC | PRN
  Administered 2016-06-04: 35 mg via INTRAVENOUS
  Administered 2016-06-04: 20 mg via INTRAVENOUS
  Administered 2016-06-04: 5 mg via INTRAVENOUS
  Administered 2016-06-04: 15 mg via INTRAVENOUS
  Administered 2016-06-04: 10 mg via INTRAVENOUS

## 2016-06-04 MED ORDER — LIDOCAINE 2% (20 MG/ML) 5 ML SYRINGE
INTRAMUSCULAR | Status: AC
  Filled 2016-06-04: qty 5

## 2016-06-04 MED ORDER — SUGAMMADEX SODIUM 500 MG/5ML IV SOLN
INTRAVENOUS | Status: DC | PRN
  Administered 2016-06-04: 450 mg via INTRAVENOUS

## 2016-06-04 MED ORDER — ONDANSETRON HCL 4 MG/2ML IJ SOLN
INTRAMUSCULAR | Status: AC
  Filled 2016-06-04: qty 4

## 2016-06-04 MED ORDER — HYDROMORPHONE HCL 1 MG/ML IJ SOLN
0.5000 mg | INTRAMUSCULAR | Status: DC | PRN
  Filled 2016-06-04: qty 1

## 2016-06-04 MED ORDER — AMLODIPINE BESYLATE 10 MG PO TABS
10.0000 mg | ORAL_TABLET | Freq: Every day | ORAL | Status: DC
  Administered 2016-06-05 – 2016-06-06 (×2): 10 mg via ORAL
  Filled 2016-06-04 (×2): qty 1

## 2016-06-04 MED ORDER — LOSARTAN POTASSIUM 50 MG PO TABS
100.0000 mg | ORAL_TABLET | Freq: Every day | ORAL | Status: DC
  Administered 2016-06-05 – 2016-06-06 (×2): 100 mg via ORAL
  Filled 2016-06-04 (×2): qty 2

## 2016-06-04 MED ORDER — LIDOCAINE HCL (CARDIAC) 20 MG/ML IV SOLN
INTRAVENOUS | Status: DC | PRN
  Administered 2016-06-04: 80 mg via INTRAVENOUS

## 2016-06-04 MED ORDER — SODIUM CHLORIDE 0.9 % IV SOLN
INTRAVENOUS | Status: DC
  Administered 2016-06-04 – 2016-06-06 (×3): via INTRAVENOUS

## 2016-06-04 MED ORDER — ENOXAPARIN SODIUM 40 MG/0.4ML ~~LOC~~ SOLN
40.0000 mg | SUBCUTANEOUS | Status: AC
  Administered 2016-06-04: 40 mg via SUBCUTANEOUS
  Filled 2016-06-04: qty 0.4

## 2016-06-04 MED ORDER — KETAMINE HCL 10 MG/ML IJ SOLN
INTRAMUSCULAR | Status: DC | PRN
  Administered 2016-06-04: 10 mg via INTRAVENOUS

## 2016-06-04 MED ORDER — PREMIER PROTEIN SHAKE
2.0000 [oz_av] | ORAL | Status: DC
  Administered 2016-06-05 – 2016-06-06 (×13): 2 [oz_av] via ORAL
  Filled 2016-06-04 (×12): qty 325.31

## 2016-06-04 MED ORDER — 0.9 % SODIUM CHLORIDE (POUR BTL) OPTIME
TOPICAL | Status: DC | PRN
  Administered 2016-06-04: 1000 mL

## 2016-06-04 MED ORDER — PROPOFOL 10 MG/ML IV BOLUS
INTRAVENOUS | Status: AC
  Filled 2016-06-04: qty 40

## 2016-06-04 MED ORDER — BUPIVACAINE LIPOSOME 1.3 % IJ SUSP
20.0000 mL | Freq: Once | INTRAMUSCULAR | Status: AC
  Administered 2016-06-04: 20 mL
  Filled 2016-06-04: qty 20

## 2016-06-04 MED ORDER — STERILE WATER FOR IRRIGATION IR SOLN
Status: DC | PRN
  Administered 2016-06-04: 1000 mL

## 2016-06-04 SURGICAL SUPPLY — 67 items
APPLIER CLIP ROT 13.4 12 LRG (CLIP)
BLADE SURG SZ11 CARB STEEL (BLADE) ×3 IMPLANT
CABLE HIGH FREQUENCY MONO STRZ (ELECTRODE) ×3 IMPLANT
CHLORAPREP W/TINT 26ML (MISCELLANEOUS) ×3 IMPLANT
CLIP APPLIE ROT 13.4 12 LRG (CLIP) IMPLANT
CLIP SUT LAPRA TY ABSORB (SUTURE) ×6 IMPLANT
COVER SURGICAL LIGHT HANDLE (MISCELLANEOUS) ×3 IMPLANT
DERMABOND ADVANCED (GAUZE/BANDAGES/DRESSINGS) ×2
DERMABOND ADVANCED .7 DNX12 (GAUZE/BANDAGES/DRESSINGS) ×1 IMPLANT
DEVICE SUT QUICK LOAD TK 5 (STAPLE) IMPLANT
DEVICE SUT TI-KNOT TK 5X26 (MISCELLANEOUS) ×2 IMPLANT
DEVICE SUTURE ENDOST 10MM (ENDOMECHANICALS) ×3 IMPLANT
DEVICE TI KNOT TK5 (MISCELLANEOUS) ×1
DRAIN PENROSE 18X1/4 LTX STRL (WOUND CARE) ×3 IMPLANT
ELECT REM PT RETURN 15FT ADLT (MISCELLANEOUS) ×3 IMPLANT
GAUZE SPONGE 4X4 12PLY STRL (GAUZE/BANDAGES/DRESSINGS) IMPLANT
GAUZE SPONGE 4X4 16PLY XRAY LF (GAUZE/BANDAGES/DRESSINGS) ×3 IMPLANT
GLOVE BIO SURGEON STRL SZ 6 (GLOVE) ×3 IMPLANT
GLOVE INDICATOR 6.5 STRL GRN (GLOVE) ×3 IMPLANT
GOWN STRL REUS W/TWL LRG LVL3 (GOWN DISPOSABLE) ×3 IMPLANT
GOWN STRL REUS W/TWL XL LVL3 (GOWN DISPOSABLE) ×12 IMPLANT
HOVERMATT SINGLE USE (MISCELLANEOUS) ×3 IMPLANT
KIT BASIN OR (CUSTOM PROCEDURE TRAY) ×3 IMPLANT
KIT GASTRIC LAVAGE 34FR ADT (SET/KITS/TRAYS/PACK) ×3 IMPLANT
LUBRICANT JELLY K Y 4OZ (MISCELLANEOUS) ×3 IMPLANT
MARKER SKIN DUAL TIP RULER LAB (MISCELLANEOUS) ×3 IMPLANT
NEEDLE SPNL 22GX3.5 QUINCKE BK (NEEDLE) ×3 IMPLANT
PACK CARDIOVASCULAR III (CUSTOM PROCEDURE TRAY) ×3 IMPLANT
QUICK LOAD TK 5 (STAPLE)
RELOAD ENDO STITCH 2.0 (ENDOMECHANICALS) ×22
RELOAD STAPLER BLUE 60MM (STAPLE) ×5 IMPLANT
RELOAD STAPLER GOLD 60MM (STAPLE) ×1 IMPLANT
RELOAD STAPLER WHITE 60MM (STAPLE) ×1 IMPLANT
SCISSORS LAP 5X45 EPIX DISP (ENDOMECHANICALS) ×3 IMPLANT
SET IRRIG TUBING LAPAROSCOPIC (IRRIGATION / IRRIGATOR) ×3 IMPLANT
SHEARS HARMONIC ACE PLUS 45CM (MISCELLANEOUS) ×3 IMPLANT
SLEEVE ADV FIXATION 12X100MM (TROCAR) ×3 IMPLANT
SLEEVE ADV FIXATION 5X100MM (TROCAR) ×3 IMPLANT
SLEEVE XCEL OPT CAN 5 100 (ENDOMECHANICALS) IMPLANT
SOLUTION ANTI FOG 6CC (MISCELLANEOUS) ×3 IMPLANT
STAPLER ECHELON BIOABSB 60 FLE (MISCELLANEOUS) IMPLANT
STAPLER ECHELON LONG 60 440 (INSTRUMENTS) ×3 IMPLANT
STAPLER RELOAD BLUE 60MM (STAPLE) ×15
STAPLER RELOAD GOLD 60MM (STAPLE) ×3
STAPLER RELOAD WHITE 60MM (STAPLE) ×3
SUT MNCRL AB 4-0 PS2 18 (SUTURE) ×3 IMPLANT
SUT RELOAD ENDO STITCH 2 48X1 (ENDOMECHANICALS) ×6
SUT RELOAD ENDO STITCH 2.0 (ENDOMECHANICALS) ×5
SUT SURGIDAC NAB ES-9 0 48 120 (SUTURE) IMPLANT
SUT VIC AB 2-0 SH 27 (SUTURE) ×2
SUT VIC AB 2-0 SH 27X BRD (SUTURE) ×1 IMPLANT
SUTURE RELOAD END STTCH 2 48X1 (ENDOMECHANICALS) ×6 IMPLANT
SUTURE RELOAD ENDO STITCH 2.0 (ENDOMECHANICALS) ×5 IMPLANT
SYR 10ML ECCENTRIC (SYRINGE) ×3 IMPLANT
SYR 20CC LL (SYRINGE) ×6 IMPLANT
TIP RIGID 35CM EVICEL (HEMOSTASIS) IMPLANT
TOWEL OR 17X26 10 PK STRL BLUE (TOWEL DISPOSABLE) ×3 IMPLANT
TOWEL OR NON WOVEN STRL DISP B (DISPOSABLE) ×3 IMPLANT
TRAY FOLEY W/METER SILVER 16FR (SET/KITS/TRAYS/PACK) ×3 IMPLANT
TROCAR ADV FIXATION 12X100MM (TROCAR) ×3 IMPLANT
TROCAR ADV FIXATION 5X100MM (TROCAR) ×3 IMPLANT
TROCAR BLADELESS OPT 5 100 (ENDOMECHANICALS) ×3 IMPLANT
TROCAR XCEL 12X100 BLDLESS (ENDOMECHANICALS) ×3 IMPLANT
TUBING CONNECTING 10 (TUBING) IMPLANT
TUBING CONNECTING 10' (TUBING)
TUBING ENDO SMARTCAP PENTAX (MISCELLANEOUS) ×3 IMPLANT
TUBING INSUF HEATED (TUBING) ×3 IMPLANT

## 2016-06-04 NOTE — Interval H&P Note (Signed)
History and Physical Interval Note:  06/04/2016 10:25 AM  Allen [name]  has presented today for surgery, with the diagnosis of MORBID OBESITY  The various methods of treatment have been discussed with the patient and family. After consideration of risks, benefits and other options for treatment, the patient has consented to  Procedure(s): LAPAROSCOPIC ROUX-EN-Y GASTRIC BYPASS WITH UPPER ENDOSCOPY (N/A) as a surgical intervention .  The patient's history has been reviewed, patient examined, no change in status, stable for surgery.  I have reviewed the patient's chart and labs.  Questions were answered to the patient's satisfaction.     Chelsea Rich Brave

## 2016-06-04 NOTE — Progress Notes (Signed)
RT placed patient on CPAP. Patient setting is 4-10 cmH2O per home setting. Sterile water was placed in water chamber for humidification. Patient is tolerating well.

## 2016-06-04 NOTE — Op Note (Signed)
Allen [name] [id number] Sep 19, 1950. 06/04/2016  Preoperative diagnosis:  1. Morbid obesity (BMI 36.07)  2. Insulin dependent diabetes mellitus, HgA1c 9.3 3. Sleep apnea on CPAP 4. Hypertension 5. Hyperlipidemia 6. GERD 7. Chronic kidney disease   Postoperative  diagnosis:  1. same  Surgical procedure: Laparoscopic Roux-en-Y gastric bypass (ante-colic, ante-gastric); upper endoscopy  Surgeon: Clovis Riley MD  Asst.: Gurney Maxin MD   Anesthesia: General plus lap-assisted TAPS block using exparel and marcaine  Complications: None   EBL: Minimal   Drains: None   Disposition: PACU in good condition   Indications for procedure: 66yo male with morbid obesity who has been unsuccessful at sustained weight loss. The patient's comorbidities are listed above. We discussed the risk and benefits of surgery including but not limited to anesthesia risk, bleeding, infection, blood clot formation, anastomotic leak, anastomotic stricture, ulcer formation, death, respiratory complications, intestinal blockage, internal hernia, gallstone formation, vitamin and nutritional deficiencies, injury to surrounding structures, failure to lose weight and mood changes.   Description of procedure: Patient is brought to the operating room and general anesthesia induced. The patient had received preoperative broad-spectrum IV antibiotics and prophylactic lovenox. The abdomen was widely sterilely prepped with Chloraprep and draped. Patient timeout was performed and correct patient and procedure confirmed. Access was obtained with a 12 mm Optiview trocar in the left upper quadrant and pneumoperitoneum established without difficulty. Under direct vision a 5 mm trocar was placed laterally in the right upper quadrant, a 52mm trocar in the right upper quadrant midclavicular line, and to the left and above the umbilicus for the camera port. A 5 mm trocar was placed laterally in the left upper quadrant. The TAPS  block with exparel and marcaine was performed bilaterally under direct laparoscopic visualization. The omentum was brought into the upper abdomen and the transverse mesocolon elevated and the ligament of Treitz clearly identified. A 40 cm biliopancreatic limb was then carefully measured from the ligament of Treitz. The small intestine was divided at this point with a single firing of the white load linear stapler. A Penrose drain was sutured to the end of the Roux-en-Y limb for later identification. A 100 cm Roux-en-Y limb was then carefully measured. At this point a side-to-side anastomosis was created between the Roux limb and the end of the biliopancreatic limb. This was accomplished with a single firing of the 60 mm white load linear stapler. The common enterotomy was closed with a running 2-0 Vicryl begun at either end of the enterotomy and tied centrally. Tisseel tissue sealant was placed over the anastomosis. The mesenteric defect was then closed with running 2-0 silk. The omentum was then divided with the harmonic scalpel up towards the transverse colon to allow mobility of the Roux limb toward the gastric pouch. The patient was then placed in steep reversed Trendelenburg. Through a 5 mm subxiphoid site the [facility] retractor was placed and the left lobe of the liver elevated with excellent exposure of the upper stomach and hiatus. The angle of His was then mobilized with the harmonic scalpel. A 5 cm gastric pouch was then carefully measured along the lesser curve of the stomach. Dissection was carried along the lesser curve at this point with the Harmonic scalpel working carefully back toward the lesser sac at right angles to the lesser curve. The free lesser sac was then entered. After being sure all tubes were removed from the stomach an initial firing of the gold load 60 mm linear stapler was fired at right angles  across the lesser curve for about 4 cm. The gastric pouch was further mobilized  posteriorly and then the pouch was completed with further firings of the 60 mm blue load linear stapler up through the previously dissected angle of His. It was ensured that the pouch was completely mobilized away from the gastric remnant. This created a nice tubular 4-5 cm gastric pouch. The Roux limb was then brought up in an antecolic fashion with the candycane facing to the patient's left without undue tension. The gastrojejunostomy was created with an initial posterior row of 2-0 Vicryl between the Roux limb and the staple line of the gastric pouch. Enterotomies were then made in the gastric pouch and the Roux limb with the harmonic scalpel and at approximately 2.5cm anastomosis was created with a single firing of the blue load linear stapler. The staple line was inspected and was intact without bleeding. The common enterotomy was then closed with running 2-0 Vicryl begun at either end and tied centrally. The Ewall tube was then easily passed through the anastomosis and an outer anterior layer of running 2-0 Vicryl was placed. The Ewald tube was removed. With the outlet of the gastrojejunostomy clamped and under saline irrigation the assistant performed upper endoscopy and with the gastric pouch tensely distended with air-there was no evidence of leak on this test. The pouch was desufflated.  The Terance Hart defect was closed with running 2-0 silk. The abdomen was inspected for any evidence of bleeding or bowel injury and everything looked fine. The Nathanson retractor was removed under direct vision after coating the anastomosis and remnant stomach staple line with Tisseel tissue sealant. All CO2 was evacuated and trocars removed. Skin incisions were closed with 4-0 monocryl in a subcuticular fashion followed by Dermabond. Sponge needle and instrument counts were correct. The patient was taken to the PACU in good condition.    Clovis Riley MD General, Bariatric, & Minimally Invasive Surgery [facility], [place]

## 2016-06-04 NOTE — Anesthesia Preprocedure Evaluation (Signed)
Anesthesia Evaluation  Patient identified by MRN, date of birth, ID band Patient awake    Reviewed: Allergy & Precautions, NPO status , Patient's Chart, lab work & pertinent test results  Airway Mallampati: II  TM Distance: >3 FB Neck ROM: Limited    Dental no notable dental hx.    Pulmonary asthma , sleep apnea and Continuous Positive Airway Pressure Ventilation ,    Pulmonary exam normal breath sounds clear to auscultation       Cardiovascular hypertension, Pt. on medications Normal cardiovascular exam Rhythm:Regular Rate:Normal     Neuro/Psych negative neurological ROS  negative psych ROS   GI/Hepatic Neg liver ROS, GERD  Medicated,  Endo/Other  negative endocrine ROSdiabetes, Insulin Dependent  Renal/GU Renal InsufficiencyRenal diseasenegative Renal ROS  negative genitourinary   Musculoskeletal  (+) Arthritis , Osteoarthritis,    Abdominal   Peds negative pediatric ROS (+)  Hematology negative hematology ROS (+)   Anesthesia Other Findings h/o cervical spine surgery  Reproductive/Obstetrics negative OB ROS                             Anesthesia Physical Anesthesia Plan  ASA: III  Anesthesia Plan: General   Post-op Pain Management:    Induction: Intravenous  Airway Management Planned: Oral ETT  Additional Equipment:   Intra-op Plan:   Post-operative Plan: Extubation in OR  Informed Consent: I have reviewed the patients History and Physical, chart, labs and discussed the procedure including the risks, benefits and alternatives for the proposed anesthesia with the patient or authorized representative who has indicated his/her understanding and acceptance.   Dental advisory given  Plan Discussed with: CRNA and Surgeon  Anesthesia Plan Comments:         Anesthesia Quick Evaluation

## 2016-06-04 NOTE — Anesthesia Procedure Notes (Signed)
Procedure Name: Intubation Date/Time: 06/04/2016 11:07 AM Performed by: Sherian Maroon A Patient Re-evaluated:Patient Re-evaluated prior to inductionOxygen Delivery Method: Circle system utilized Preoxygenation: Pre-oxygenation with 100% oxygen Intubation Type: IV induction Ventilation: Two handed mask ventilation required Laryngoscope Size: Glidescope Grade View: Grade I Tube type: Oral Tube size: 8.0 mm Number of attempts: 1 Airway Equipment and Method: Stylet Placement Confirmation: ETT inserted through vocal cords under direct vision,  breath sounds checked- equal and bilateral and positive ETCO2 Secured at: 22 cm Tube secured with: Tape

## 2016-06-04 NOTE — Anesthesia Postprocedure Evaluation (Signed)
Anesthesia Post Note  Patient: [place]  Procedure(s) Performed: Procedure(s) (LRB): LAPAROSCOPIC ROUX-EN-Y GASTRIC BYPASS WITH UPPER ENDOSCOPY (N/A)  Patient location during evaluation: PACU Anesthesia Type: General Level of consciousness: awake, awake and alert and oriented Pain management: pain level controlled Vital Signs Assessment: post-procedure vital signs reviewed and stable Respiratory status: spontaneous breathing Cardiovascular status: blood pressure returned to baseline Postop Assessment: no headache Anesthetic complications: no       Last Vitals:  Vitals:   06/04/16 0858 06/04/16 1407  BP: (!) 163/75 (!) 156/75  Pulse: 64 74  Resp: 18 14  Temp: 36.7 C 36.9 C    Last Pain:  Vitals:   06/04/16 1407  TempSrc:   PainSc: 0-No pain                 FLYNN-COOK,Maylin [place] A

## 2016-06-04 NOTE — H&P (View-Only) (Signed)
[place] Patient #: [id number] DOB: 05/29/1950 Married / Language: English / Race: White Male   History of Present Illness ( Patient words: This is a very nice 66 year old gentleman who is here with his wife for evaluation for bariatric surgery. He has been considering surgery and following with his endocrinologist and primary care as well as a nutritionist for the last year trying to get approved for bariatric surgery. He is tried low-calorie and low carbohydrate diets with limited success in the past. He is interested in the Roux-en-Y gastric bypass due to most importantly insulin-dependent diabetes but additionally he has several other obesity related comorbidities.  The patient is a 66 year old male who presents for a bariatric surgery evaluation. Associated symptoms include joint pains and heartburn. Initial presentation included inability to lose weight, hypertension and diabetes. Disease complications include diabetes, hypertension, hyperlipidemia and sleep apnea. Current diet includes low carbohydrate food choices. walking (golfs). The patient is currently able to do activities of daily living without limitations, able to work without limitations and able to do housework without limitations. Past evaluation has included body mass index, dietician consultation, sleep study and endocrinology consultation. Past treatment has included low calorie diet and diabetic diet. Surgical history: cholecystectomy. Cardiac history: The patient has sleep apnea, and patient denies smoking. Gastrointestinal History: Patient has heartburn, and denies dysphagia. MBSQIP recognized comorbidities: Patient has hypertension, hyperlipidemia, diabetes mellitus, sleep apnea and gastroesophageal reflux disease.    Review of Systems  All other systems negative   Physical Exam  General Mental Status-Alert. General Appearance-Cooperative. Orientation-Oriented X4. Build &  Nutrition-Obese. Posture-Normal posture.  Integumentary Global Assessment Normal Exam - Head/Face: no rashes, ulcers, lesions or evidence of photo damage. No palpable nodules or masses and Neck: no visible lesions or palpable masses.  Head and Neck Head-normocephalic, atraumatic with no lesions or palpable masses. Face Global Assessment - atraumatic. Thyroid Gland Characteristics - normal size and consistency.  Eye Eyeball - Bilateral-Extraocular movements intact. Sclera/Conjunctiva - Bilateral-No scleral icterus, No Discharge.  ENMT Nose and Sinuses Nose - no deformities observed, no swelling present.  Chest and Lung Exam Palpation Normal exam - Non-tender. Auscultation Breath sounds - Normal.  Cardiovascular Auscultation Rhythm - Regular. Heart Sounds - S1 WNL and S2 WNL. Carotid arteries - No Carotid bruit.  Abdomen Inspection Normal Exam - No Visible peristalsis, No Abnormal pulsations and No Paradoxical movements. Palpation/Percussion Normal exam - Soft, Non Tender, No Rebound tenderness, No Rigidity (guarding), No hepatosplenomegaly and No Palpable abdominal masses.  Peripheral Vascular Upper Extremity Palpation - Pulses bilaterally normal. Lower Extremity Palpation - Edema - Bilateral - No edema.  Neurologic Neurologic evaluation reveals -normal sensation and normal coordination.  Neuropsychiatric Mental status exam performed with findings of-able to articulate well with normal speech/language, rate, volume and coherence and thought content normal with ability to perform basic computations and apply abstract reasoning.  Musculoskeletal Normal Exam - Bilateral-Upper Extremity Strength Normal and Lower Extremity Strength Normal.    Assessment & Plan  MORBID OBESITY (E66.01) Story: I agree that this patient is a good candidate for Roux-en-Y gastric bypass. I discussed with him the expected physiologic effects and expected weight loss  trajectory. We discussed the expected perioperative and postoperative recovery. Went over the preop workup that will need to be done, he already has a known history of sleep apnea and uses CPAP machine. He and his wife asked many insightful and appropriate questions all of which were answered. They have attended a seminar in January with Dr. Hassell Done  and their comprehension of that information has been excellent. Additionally they have done their own research over the course of the last year while trying to get on board for bariatric surgery. He has received clearance from his cardiologist, Dr. Sabra Heck in [place].

## 2016-06-04 NOTE — Transfer of Care (Signed)
Immediate Anesthesia Transfer of Care Note  Patient: Allen [name]  Procedure(s) Performed: Procedure(s): LAPAROSCOPIC ROUX-EN-Y GASTRIC BYPASS WITH UPPER ENDOSCOPY (N/A)  Patient Location: PACU  Anesthesia Type:General  Level of Consciousness: awake, alert  and oriented  Airway & Oxygen Therapy: Patient Spontanous Breathing and Patient connected to face mask oxygen  Post-op Assessment: Report given to RN and Post -op Vital signs reviewed and stable  Post vital signs: Reviewed and stable  Last Vitals:  Vitals:   06/04/16 0858 06/04/16 1407  BP: (!) 163/75 (!) 156/75  Pulse: 64 74  Resp: 18 14  Temp: 36.7 C (P) 36.9 C    Last Pain:  Vitals:   06/04/16 0917  TempSrc:   PainSc: 0-No pain      Patients Stated Pain Goal: 3 (37/10/62 6948)  Complications: No apparent anesthesia complications

## 2016-06-04 NOTE — Op Note (Signed)
Preoperative diagnosis: Roux-en-Y gastric bypass  Postoperative diagnosis: Same   Procedure: Upper endoscopy   Surgeon: Gurney Maxin, M.D.  Anesthesia: Gen.   Indications for procedure: This patient was undergoing a Roux-en-Y gastric bypass.   Description of procedure: The endoscopy was placed in the mouth and into the oropharynx and under endoscopic vision it was advanced to the esophagogastric junction. The pouch was insufflated and no bleeding or bubbles were seen. The GEJ was identified at 45cm from the teeth. The anastomosis was seen and patent at 50cm. No bleeding or leaks were detected. The scope was withdrawn without difficulty.   Gurney Maxin, M.D. General, Bariatric, & Minimally Invasive Surgery [facility] [place]

## 2016-06-05 ENCOUNTER — Encounter (HOSPITAL_COMMUNITY): Payer: Self-pay | Admitting: Surgery

## 2016-06-05 LAB — COMPREHENSIVE METABOLIC PANEL
ALBUMIN: 3.4 g/dL — AB (ref 3.5–5.0)
ALK PHOS: 41 U/L (ref 38–126)
ALT: 96 U/L — ABNORMAL HIGH (ref 17–63)
AST: 111 U/L — ABNORMAL HIGH (ref 15–41)
Anion gap: 11 (ref 5–15)
BILIRUBIN TOTAL: 0.6 mg/dL (ref 0.3–1.2)
BUN: 30 mg/dL — ABNORMAL HIGH (ref 6–20)
CALCIUM: 8.5 mg/dL — AB (ref 8.9–10.3)
CO2: 26 mmol/L (ref 22–32)
CREATININE: 1.87 mg/dL — AB (ref 0.61–1.24)
Chloride: 104 mmol/L (ref 101–111)
GFR calc Af Amer: 42 mL/min — ABNORMAL LOW (ref >60)
GFR calc non Af Amer: 36 mL/min — ABNORMAL LOW (ref >60)
GLUCOSE: 277 mg/dL — AB (ref 65–99)
Potassium: 3.9 mmol/L (ref 3.5–5.1)
Sodium: 141 mmol/L (ref 135–145)
TOTAL PROTEIN: 6.7 g/dL (ref 6.5–8.1)

## 2016-06-05 LAB — CBC WITH DIFFERENTIAL/PLATELET
Basophils Absolute: 0 10*3/uL (ref 0.0–0.1)
Basophils Relative: 0 %
Eosinophils Absolute: 0 10*3/uL (ref 0.0–0.7)
Eosinophils Relative: 0 %
HEMATOCRIT: 39.7 % (ref 39.0–52.0)
HEMOGLOBIN: 12.7 g/dL — AB (ref 13.0–17.0)
LYMPHS ABS: 1.1 10*3/uL (ref 0.7–4.0)
Lymphocytes Relative: 8 %
MCH: 27.4 pg (ref 26.0–34.0)
MCHC: 32 g/dL (ref 30.0–36.0)
MCV: 85.7 fL (ref 78.0–100.0)
Monocytes Absolute: 0.6 10*3/uL (ref 0.1–1.0)
Monocytes Relative: 4 %
NEUTROS PCT: 88 %
Neutro Abs: 11.9 10*3/uL — ABNORMAL HIGH (ref 1.7–7.7)
Platelets: 202 10*3/uL (ref 150–400)
RBC: 4.63 MIL/uL (ref 4.22–5.81)
RDW: 13.9 % (ref 11.5–15.5)
WBC: 13.6 10*3/uL — ABNORMAL HIGH (ref 4.0–10.5)

## 2016-06-05 LAB — MAGNESIUM: Magnesium: 1.3 mg/dL — ABNORMAL LOW (ref 1.7–2.4)

## 2016-06-05 LAB — GLUCOSE, CAPILLARY
GLUCOSE-CAPILLARY: 254 mg/dL — AB (ref 65–99)
GLUCOSE-CAPILLARY: 191 mg/dL — AB (ref 65–99)
GLUCOSE-CAPILLARY: 161 mg/dL — AB (ref 65–99)
Glucose-Capillary: 203 mg/dL — ABNORMAL HIGH (ref 65–99)
Glucose-Capillary: 235 mg/dL — ABNORMAL HIGH (ref 65–99)
Glucose-Capillary: 262 mg/dL — ABNORMAL HIGH (ref 65–99)

## 2016-06-05 MED ORDER — MAGNESIUM SULFATE 4 GM/100ML IV SOLN
4.0000 g | Freq: Once | INTRAVENOUS | Status: AC
  Administered 2016-06-05: 4 g via INTRAVENOUS
  Filled 2016-06-05: qty 100

## 2016-06-05 MED ORDER — BECLOMETHASONE DIPROPIONATE 80 MCG/ACT IN AERS
1.0000 | INHALATION_SPRAY | Freq: Two times a day (BID) | RESPIRATORY_TRACT | Status: DC
  Administered 2016-06-05: 1 via RESPIRATORY_TRACT

## 2016-06-05 MED ORDER — INSULIN GLARGINE 100 UNIT/ML ~~LOC~~ SOLN
90.0000 [IU] | Freq: Every day | SUBCUTANEOUS | Status: DC
  Filled 2016-06-05: qty 0.9

## 2016-06-05 MED ORDER — INSULIN GLARGINE 100 UNIT/ML ~~LOC~~ SOLN
45.0000 [IU] | Freq: Every day | SUBCUTANEOUS | Status: DC
  Administered 2016-06-05: 45 [IU] via SUBCUTANEOUS
  Filled 2016-06-05: qty 0.45

## 2016-06-05 NOTE — Progress Notes (Signed)
Patient alert and oriented, Post op day 1.  Provided support and encouragement.  Encouraged pulmonary toilet, ambulation and small sips of liquids.  All questions answered.  Will continue to monitor. 

## 2016-06-05 NOTE — Progress Notes (Signed)
Inpatient Diabetes Program Recommendations  AACE/ADA: New Consensus Statement on Inpatient Glycemic Control (2015)  Target Ranges:  Prepandial:   less than 140 mg/dL      Peak postprandial:   less than 180 mg/dL (1-2 hours)      Critically ill patients:  140 - 180 mg/dL   Lab Results  Component Value Date   GLUCAP 235 (H) 06/05/2016   HGBA1C 9.3 (H) 04/25/2016    Review of Glycemic Control  Blood sugars in 200s today. Will need basal insulin (reduced dose) tonight.    Inpatient Diabetes Program Recommendations:    Decrease Lantus to 45 units QHS (half home dose) Continue with Novolog 0-20 units Q4H.  Will follow.  Thank you. Lorenda Peck, RD, LDN, CDE Inpatient Diabetes Coordinator [phone]

## 2016-06-05 NOTE — Progress Notes (Addendum)
S: Had some nausea yesterday evening but none since. Poor sleep overnight. Tolerating bariatric clears without further nausea or dysphagia. No chest pain or epigastric pain. Incisional pain well controlled. Walked the halls last night.   Vitals, labs, intake/output, and orders reviewed at this time. Tmax 98.9. HR 64-93. sats 92% currently room air. Normo-to hypertensive. 1550 urine output. 1800 PO intake. Glucose consistently above 200. Creatinine 1.87 (baseline 1.6). Mag 1.3. WBC 13.6 (8.9), Hgb 12.7 (13.6)  Gen: A&Ox3, no distress  H&N: EOMI, atraumatic, neck supple Chest: unlabored respirations, RRR Abd: soft, minimally tender, nondistended, incisions c/d/i with dermabond Ext: warm, no edema Neuro: grossly normal  Lines/tubes/drains: PIV  A/P:  POD 1 lap roux en y gastric bypass. Doing well.  -Continue bariatric clears, protein drinks today. Continue aggressive pulm toilet and ambulation in the halls.  -Hyperglycemia- continue sliding scale, add long-acting insulin (home dose) -Hypomagnesemia- replace IV, recheck in AM -Mild elevation in creatinine- good urine output, continue IVF, recheck labs in AM -Mild post-op leukocytosis- expected. Afebrile, asymptomatic. Continue to monitor.  -Preop labs equivocal for H pylori- IgA positive and IgG neg. Stool antigen test pending. Suspect false positive; asymptomatic preop.   Romana Juniper, MD [facility], [place] Pager [phone]

## 2016-06-05 NOTE — Progress Notes (Signed)
Patient alert and oriented, pain is controlled. Patient is tolerating fluids, advanced to protein shake today, patient is tolerating well. Reviewed Gastric Bypass discharge instructions with patient and patient is able to articulate understanding. Provided information on BELT program, Support Group and [facility] outpatient pharmacy. All questions answered, will continue to monitor.    

## 2016-06-05 NOTE — Plan of Care (Signed)
Problem: Food- and Nutrition-Related Knowledge Deficit (NB-1.1) Goal: Nutrition education Formal process to instruct or train a patient/client in a skill or to impart knowledge to help patients/clients voluntarily manage or modify food choices and eating behavior to maintain or improve health. Outcome: Completed/Met Date Met: 06/05/16 Nutrition Education Note  Received consult for diet education per DROP protocol.   Discussed 2 week post op diet with pt. Emphasized that liquids must be non carbonated, non caffeinated, and sugar free. Fluid goals discussed. Pt to follow up with outpatient bariatric RD for further diet progression after 2 weeks. Multivitamins and minerals also reviewed. Teach back method used, pt expressed understanding, expect good compliance.   Diet: First 2 Weeks  You will see the nutritionist about two (2) weeks after your surgery. The nutritionist will increase the types of foods you can eat if you are handling liquids well:  If you have severe vomiting or nausea and cannot handle clear liquids lasting longer than 1 day, call your surgeon  Protein Shake  Drink at least 2 ounces of shake 5-6 times per day  Each serving of protein shakes (usually 8 - 12 ounces) should have a minimum of:  15 grams of protein  And no more than 5 grams of carbohydrate  Goal for protein each day:  Men = 80 grams per day  Women = 60 grams per day  Protein powder may be added to fluids such as non-fat milk or Lactaid milk or Soy milk (limit to 35 grams added protein powder per serving)   Hydration  Slowly increase the amount of water and other clear liquids as tolerated (See Acceptable Fluids)  Slowly increase the amount of protein shake as tolerated  Sip fluids slowly and throughout the day  May use sugar substitutes in small amounts (no more than 6 - 8 packets per day; i.e. Splenda)   Fluid Goal  The first goal is to drink at least 8 ounces of protein shake/drink per day (or as directed  by the nutritionist); some examples of protein shakes are Premier Protein, [employer], [employer]R [employer], EAS Edge HP, and Unjury. See handout from pre-op Bariatric Education Class:  Slowly increase the amount of protein shake you drink as tolerated  You may find it easier to slowly sip shakes throughout the day  It is important to get your proteins in first  Your fluid goal is to drink 64 - 100 ounces of fluid daily  It may take a few weeks to build up to this  32 oz (or more) should be clear liquids  And  32 oz (or more) should be full liquids (see below for examples)  Liquids should not contain sugar, caffeine, or carbonation   Clear Liquids:  Water or Sugar-free flavored water (i.e. Fruit H2O, Propel)  Decaffeinated coffee or tea (sugar-free)  Crystal Lite, Wyler's Lite, Minute Maid Lite  Sugar-free Jell-O  Bouillon or broth  Sugar-free Popsicle: *Less than 20 calories each; Limit 1 per day   Full Liquids:  Protein Shakes/Drinks + 2 choices per day of other full liquids  Full liquids must be:  No More Than 12 grams of Carbs per serving  No More Than 3 grams of Fat per serving  Strained low-fat cream soup  Non-Fat milk  Fat-free Lactaid Milk  Sugar-free yogurt (Dannon Lite & Fit, [place] yogurt, Oikos Zero)   Clayton Bibles, MS, RD, LDN Pager: [phone] After Hours Pager: [phone]

## 2016-06-06 ENCOUNTER — Encounter: Payer: Self-pay | Admitting: "Endocrinology

## 2016-06-06 ENCOUNTER — Ambulatory Visit: Payer: Medicare Other | Admitting: "Endocrinology

## 2016-06-06 LAB — BASIC METABOLIC PANEL
ANION GAP: 8 (ref 5–15)
BUN: 28 mg/dL — ABNORMAL HIGH (ref 6–20)
CO2: 26 mmol/L (ref 22–32)
Calcium: 8.3 mg/dL — ABNORMAL LOW (ref 8.9–10.3)
Chloride: 107 mmol/L (ref 101–111)
Creatinine, Ser: 1.59 mg/dL — ABNORMAL HIGH (ref 0.61–1.24)
GFR, EST AFRICAN AMERICAN: 51 mL/min — AB (ref >60)
GFR, EST NON AFRICAN AMERICAN: 44 mL/min — AB (ref >60)
Glucose, Bld: 167 mg/dL — ABNORMAL HIGH (ref 65–99)
POTASSIUM: 3.6 mmol/L (ref 3.5–5.1)
Sodium: 141 mmol/L (ref 135–145)

## 2016-06-06 LAB — CBC WITH DIFFERENTIAL/PLATELET
BASOS ABS: 0 10*3/uL (ref 0.0–0.1)
Basophils Relative: 0 %
Eosinophils Absolute: 0 10*3/uL (ref 0.0–0.7)
Eosinophils Relative: 0 %
HEMATOCRIT: 39.6 % (ref 39.0–52.0)
Hemoglobin: 12.6 g/dL — ABNORMAL LOW (ref 13.0–17.0)
LYMPHS PCT: 13 %
Lymphs Abs: 1.7 10*3/uL (ref 0.7–4.0)
MCH: 27.6 pg (ref 26.0–34.0)
MCHC: 31.8 g/dL (ref 30.0–36.0)
MCV: 86.7 fL (ref 78.0–100.0)
Monocytes Absolute: 0.9 10*3/uL (ref 0.1–1.0)
Monocytes Relative: 6 %
NEUTROS ABS: 11.1 10*3/uL — AB (ref 1.7–7.7)
NEUTROS PCT: 81 %
Platelets: 177 10*3/uL (ref 150–400)
RBC: 4.57 MIL/uL (ref 4.22–5.81)
RDW: 14.1 % (ref 11.5–15.5)
WBC: 13.7 10*3/uL — AB (ref 4.0–10.5)

## 2016-06-06 LAB — GLUCOSE, CAPILLARY
GLUCOSE-CAPILLARY: 157 mg/dL — AB (ref 65–99)
GLUCOSE-CAPILLARY: 176 mg/dL — AB (ref 65–99)
GLUCOSE-CAPILLARY: 181 mg/dL — AB (ref 65–99)
Glucose-Capillary: 148 mg/dL — ABNORMAL HIGH (ref 65–99)

## 2016-06-06 LAB — MAGNESIUM: Magnesium: 1.9 mg/dL (ref 1.7–2.4)

## 2016-06-06 MED ORDER — HYDROMORPHONE HCL 1 MG/ML IJ SOLN: 0.5000 mg | INTRAMUSCULAR | Status: DC | PRN

## 2016-06-06 MED ORDER — INSULIN GLARGINE 100 UNIT/ML SOLOSTAR PEN: 45.0000 [IU] | PEN_INJECTOR | Freq: Every day | SUBCUTANEOUS | 2 refills | Status: DC

## 2016-06-06 NOTE — Progress Notes (Signed)
AVS reviewed with patient.  Questions answered. Discharged home with wife via wheelchair

## 2016-06-06 NOTE — Progress Notes (Signed)
S: Had a good day yesterday but exhausted from visitors and poor sleep. Walked several laps. Headaches overnight. Denies nausea, dysphagia or epigastric pain. Appropriately sore around incisions. Passing flatus.   Vitals, labs, intake/output, and orders reviewed at this time:  Tmax 99.5, HR 70-79, normo- to hypertensive. Sats 94% currently on room air.   551mL PO intake recorded, 2930mL urine output.   Magnesium corrected to 1.9 this morning. Blood glucoses 148-191 last 12 hours. Creatinine back to baseline 1.59. WBC stable 13.7, Hgb stable 12.6, platelets decreased to 177 (202 yesterday, 222 preop)  Gen: A&Ox3, no distress  H&N: EOMI, atraumatic, neck supple Chest: unlabored respirations, RRR Abd: soft, appropriately minimally tender, nondistended, incisions c/d/i with dermabond Ext: warm, no edema Neuro: grossly normal  Lines/tubes/drains: PIV  A/P:  POD 2 lap roux en y gastric bypass, doing well -Continue bariatric clears, protein drinks today. Continue aggressive pulm toilet and ambulation in the halls.  -Hyperglycemia- continue sliding scale, long-acting insulin (half home dose). Appreciate assistance from diabetes coordinator  -Hypomagnesemia- resolved -Mild elevation in creatinine- resolved, good urine output -Mild post-op leukocytosis- stable, expected. Afebrile, asymptomatic.   -Preop labs equivocal for H pylori- IgA positive and IgG neg. Stool antigen test pending. Suspect false positive; asymptomatic preop. If no bowel movement prior to discharge will perform test as outpatient.   Discharge home later this morning.   Romana Juniper, MD [facility], [place] Pager [phone]

## 2016-06-06 NOTE — Progress Notes (Signed)
Visited patient this am.  Discussed patient medications with wife/patient.  Understanding about decreasing Lantus and wife is contacting Dr Laverta Baltimore for new sliding scale insulin orders.  Appointment to see endocrinologist next week for further evaluation.  No further questions at this time.  Provided contact information for patient and wife.

## 2016-06-06 NOTE — Discharge Instructions (Signed)
GASTRIC BYPASS/SLEEVE  Home Care Instructions   These instructions are to help you care for yourself when you go home.  Call: If you have any problems.  Call [phone] and ask for the surgeon on call  If you need immediate assistance come to the ER at [facility]. Tell the ER staff you are a new post-op gastric bypass or gastric sleeve patient  Signs and symptoms to report:  Severe  vomiting or nausea o If you cannot handle clear liquids for longer than 1 day, call your surgeon  Abdominal pain which does not get better after taking your pain medication  Fever greater than 100.4  F and chills  Heart rate over 100 beats a minute  Trouble breathing  Chest pain  Redness,  swelling, drainage, or foul odor at incision (surgical) sites  If your incisions open or pull apart  Swelling or pain in calf (lower leg)  Diarrhea (Loose bowel movements that happen often), frequent watery, uncontrolled bowel movements  Constipation, (no bowel movements for 3 days) if this happens: o Take Milk of Magnesia, 2 tablespoons by mouth, 3 times a day for 2 days if needed o Stop taking Milk of Magnesia once you have had a bowel movement o Call your doctor if constipation continues Or o Take Miralax  (instead of Milk of Magnesia) following the label instructions o Stop taking Miralax once you have had a bowel movement o Call your doctor if constipation continues  Anything you think is abnormal for you   Normal side effects after surgery:  Unable to sleep at night or unable to concentrate  Irritability  Being tearful (crying) or depressed  These are common complaints, possibly related to your anesthesia, stress of surgery, and change in lifestyle, that usually go away a few weeks after surgery. If these feelings continue, call your medical doctor.  Wound Care: You may have surgical glue, steri-strips, or staples over your incisions after surgery  Surgical glue: Looks like clear film  over your incisions and will wear off a little at a time  Steri-strips: Adhesive strips of tape over your incisions. You may notice a yellowish color on skin under the steri-strips. This is used to make the steri-strips stick better. Do not pull the steri-strips off - let them fall off  Staples: Staples may be removed before you leave the hospital o If you go home with staples, call [facility] for an appointment with your surgeons nurse to have staples removed 10 days after surgery, [phone]  Showering: You may shower two (2) days after your surgery unless your surgeon tells you differently o Wash gently around incisions with warm soapy water, rinse well, and gently pat dry o If you have a drain (tube from your incision), you may need someone to hold this while you shower o No tub baths until staples are removed and incisions are healed   Medications:  Medications should be liquid or crushed if larger than the size of a dime  Extended release pills (medication that releases a little bit at a time through the  day) should not be crushed  Depending on the size and number of medications you take, you may need to space (take a few throughout the day)/change the time you take your medications so that you do not over-fill your pouch (smaller stomach)  Make sure you follow-up with you primary care physician to make medication changes needed during rapid weight loss and life -style changes  If you  have diabetes, follow up with your doctor that orders your diabetes medication(s) within one week after surgery and check your blood sugar regularly Prescriptions for pain medication and acid reducer were given to you before surgery. Take the pain medicine if needed. Take the acid reducer as prescribed every day.    Do not drive while taking narcotics (pain medications)   Do not take acetaminophen (Tylenol) and Roxicet or Lortab Elixir at the same time since these pain medications  contain acetaminophen   Diet:  First 2 Weeks You will see the nutritionist about two (2) weeks after your surgery. The nutritionist will increase the types of foods you can eat if you are handling liquids well:  If you have severe vomiting or nausea and cannot handle clear liquids lasting longer than 1 day call your surgeon Protein Shake  Drink at least 2 ounces of shake 5-6 times per day  Each serving of protein shakes (usually 8-12 ounces) should have a minimum of: o 15 grams of protein o And no more than 5 grams of carbohydrate  Goal for protein each day: o Men = 80 grams per day o Women = 60 grams per day     Protein powder may be added to fluids such as non-fat milk or Lactaid milk or Soy milk (limit to 35 grams added protein powder per serving)  Hydration  Slowly increase the amount of water and other clear liquids as tolerated (See Acceptable Fluids)  Slowly increase the amount of protein shake as tolerated  Sip fluids slowly and throughout the day  May use sugar substitutes in small amounts (no more than 6-8 packets per day; i.e. Splenda)  Fluid Goal  The first goal is to drink at least 8 ounces of protein shake/drink per day (or as directed by the nutritionist); some examples of protein shakes are [employer], [employer], EAS Edge HP, and Unjury. - See handout from pre-op Bariatric Education Class: o Slowly increase the amount of protein shake you drink as tolerated o You may find it easier to slowly sip shakes throughout the day o It is important to get your proteins in first  Your fluid goal is to drink 64-100 ounces of fluid daily o It may take a few weeks to build up to this   32 oz. (or more) should be clear liquids And  32 oz. (or more) should be full liquids (see below for examples)  Liquids should not contain sugar, caffeine, or carbonation  Clear Liquids:  Water of Sugar-free flavored water (i.e. Fruit HO, Propel)  Decaffeinated coffee  or tea (sugar-free)  Crystal lite, Wylers Lite, Minute Maid Lite  Sugar-free Jell-O  Bouillon or broth  Sugar-free Popsicle:    - Less than 20 calories each; Limit 1 per day  Full Liquids:                   Protein Shakes/Drinks + 2 choices per day of other full liquids  Full liquids must be: o No More Than 12 grams of Carbs per serving o No More Than 3 grams of Fat per serving  Strained low-fat cream soup  Non-Fat milk  Fat-free Lactaid Milk  Sugar-free yogurt (Dannon Lite & Fit, Greek yogurt)    Vitamins and Minerals  Start 1 day after surgery unless otherwise directed by your surgeon  2 Chewable Multivitamin / Multimineral Supplement with iron (i.e. Centrum for Adults)  Vitamin B-12, 350-500 micrograms sub-lingual (place tablet under the tongue) each day  Chewable Calcium Citrate with Vitamin D-3 (Example: 3 Chewable Calcium  Plus 600 with Vitamin D-3) o Take 500 mg three (3) times a day for a total of 1500 mg each day o Do not take all 3 doses of calcium at one time as it may cause constipation, and you can only absorb 500 mg at a time o Do not mix multivitamins containing iron with calcium supplements;  take 2 hours apart o Do not substitute Tums (calcium carbonate) for your calcium  Menstruating women and those at risk for anemia ( a blood disease that causes weakness) may need extra iron o Talk to your doctor to see if you need more iron  If you need extra iron: Total daily Iron recommendation (including Vitamins) is 50 to 100 mg Iron/day  Do not stop taking or change any vitamins or minerals until you talk to your nutritionist or surgeon  Your nutritionist and/or surgeon must approve all vitamin and mineral supplements   Activity and Exercise: It is important to continue walking at home. Limit your physical activity as instructed by your doctor. During this time, use these guidelines:  Do not lift anything greater than ten  (10) pounds for at least two (2)  weeks  Do not go back to work or drive until [employer] production says you can  You may have sex when you feel comfortable o It is VERY important for male patients to use a reliable birth control method; fertility often increase after surgery o Do not get pregnant for at least 18 months  Start exercising as soon as your doctor tells you that you can o Make sure your doctor approves any physical activity  Start with a simple walking program  Walk 5-15 minutes each day, 7 days per week  Slowly increase until you are walking 30-45 minutes per day  Consider joining our [place] program. [phone] or email [email or web address]   Special Instructions Things to remember:  Free counseling is available for you and your family through collaboration between [facility] and [place]. Please call [phone] and leave a message  Use your CPAP when sleeping if this applies to you  Consider buying a medical alert bracelet that says you had lap-band surgery     You will likely have your first fill (fluid added to your band) 6 - 8 weeks after surgery  [facility] has a free Bariatric Surgery Support Group that meets monthly, the 3rd Thursday, [place]. You can see classes online at [email or web address]  It is very important to keep all follow up appointments with your surgeon, nutritionist, primary care physician, and behavioral health practitioner o After the first year, please follow up with your bariatric surgeon and nutritionist at least once a year in order to maintain best weight loss results                    [facility]:  [place]: [phone]               Bariatric Nurse Coordinator: [phone]  Gastric Bypass/Sleeve Home Care Instructions  Rev. 02/2012  Reviewed and Endorsed                                                     by [facility] Patient Education Committee, Jan, 2014

## 2016-06-06 NOTE — Discharge Summary (Signed)
Physician Discharge Summary  Allen [name] Kirsten WCB:[id number] DOB: 04-19-1950 DOA: 06/04/2016  PCP: Terrial Rhodes, MD  Admit date: 06/04/2016 Discharge date: 06/06/2016  Recommendations for Outpatient Follow-up:  1. Follow up as scheduled with endocrinologist to titrate insulin dosing. Please note we have decreased your Lantus to 45 units at night. Call that provider with issues regarding low or high blood glucoses on current dose.  2. Follow up with primary care doctor within 1-2 weeks of surgery to titrate blood pressure and other medications.   Follow-up Information    Clovis Riley, MD. Go on 06/29/2016.   Specialty:  General Surgery Why:  at [address] Contact information: [phone]        Clovis Riley, MD Follow up.   Specialty:  General Surgery Contact information: [phone]          Discharge Diagnoses:  Active Problems:   Morbid obesity [facility])   Surgical Procedure: Laparoscopic Roux-en-Y gastric bypass, upper endoscopy  Discharge Condition: Good Disposition: Home  Diet recommendation: Postoperative gastric bypass diet  Filed Weights   06/04/16 0858 06/05/16 1010  Weight: 114 kg (251 lb 6.4 oz) 114.7 kg (252 lb 14.4 oz)     Hospital Course:  The patient was admitted for a planned laparoscopic Roux-en-Y gastric bypass. Please see operative note. Preoperatively the patient was given prophylactic lovenox for DVT prophylaxis. Postoperative prophylactic Lovenox dosing was started on the morning of postoperative day 1. The patient was started on ice chips and water which they tolerated. On postoperative day 1 the patient's diet was advanced to protein shakes which they also tolerated. The patient was ambulating without difficulty. Their vital signs are stable without fever or tachycardia. Their hemoglobin had remained stable. The patient was maintained on their home settings for CPAP therapy. The patient had received discharge instructions and counseling.  They were deemed stable for discharge.  Discharge Instructions    Ambulate hourly while awake    Complete by:  As directed    Call MD for:  difficulty breathing, headache or visual disturbances    Complete by:  As directed    Call MD for:  persistant dizziness or light-headedness    Complete by:  As directed    Call MD for:  persistant nausea and vomiting    Complete by:  As directed    Call MD for:  redness, tenderness, or signs of infection (pain, swelling, redness, odor or green/yellow discharge around incision site)    Complete by:  As directed    Call MD for:  severe uncontrolled pain    Complete by:  As directed    Call MD for:  temperature >101 F    Complete by:  As directed    Driving Restrictions    Complete by:  As directed    No driving while using narcotic pain medications   Incentive spirometry    Complete by:  As directed    Perform hourly while awake   No wound care    Complete by:  As directed    Ok to shower and pat dry. No soaking or swimming for at least 2 weeks. No rubbing or scrubbing incisions, no lotions or ointments to incisions. Skin glue will flake off after a couple weeks.     Allergies as of 06/06/2016   No Known Allergies     Medication List    STOP taking these medications   omeprazole 20 MG capsule Commonly known as:  PRILOSEC     TAKE  these medications   amLODipine 10 MG tablet Commonly known as:  NORVASC Take 10 mg by mouth daily. Notes to patient:  Monitor Blood Pressure Daily and keep a log for primary care physician.  You may need to make changes to your medications with rapid weight loss.     atorvastatin 20 MG tablet Commonly known as:  LIPITOR Take 20 mg by mouth daily at 6 PM.   beclomethasone 80 MCG/ACT inhaler Commonly known as:  QVAR Inhale 1 puff into the lungs 2 (two) times daily.   eplerenone 50 MG tablet Commonly known as:  INSPRA Take 50 mg by mouth daily. Notes to patient:  Monitor Blood Pressure Daily and keep a  log for primary care physician.  Monitor for symptoms of dehydration.  You may need to make changes to your medications with rapid weight loss.     fenofibrate 145 MG tablet Commonly known as:  TRICOR Take 145 mg by mouth every evening.   Fish Oil 1000 MG Caps Take 1,000 mg by mouth 2 (two) times daily.   gabapentin 300 MG capsule Commonly known as:  NEURONTIN Take 300 mg by mouth at bedtime as needed (cough).   insulin aspart 100 UNIT/ML FlexPen Commonly known as:  NOVOLOG FLEXPEN Inject 20-26 Units into the skin 3 (three) times daily with meals. What changed:  how much to take  additional instructions   Insulin Glargine 100 UNIT/ML Solostar Pen Commonly known as:  LANTUS SOLOSTAR Inject 45 Units into the skin at bedtime. What changed:  how much to take  Another medication with the same name was removed. Continue taking this medication, and follow the directions you see here.   levocetirizine 5 MG tablet Commonly known as:  XYZAL Take 5 mg by mouth every evening.   losartan 100 MG tablet Commonly known as:  COZAAR Take 100 mg by mouth daily. Notes to patient:  Monitor Blood Pressure Daily and keep a log for primary care physician.  You may need to make changes to your medications with rapid weight loss.     metFORMIN 500 MG tablet Commonly known as:  GLUCOPHAGE TAKE 1 TABLET (500 MG TOTAL) BY MOUTH 2 (TWO) TIMES DAILY WITH A MEAL. Notes to patient:  Monitor Blood Sugar Frequently and keep a log for primary care physician, you may need to adjust medication dosage with rapid weight loss.     metoprolol tartrate 50 MG tablet Commonly known as:  LOPRESSOR Take 50 mg by mouth every morning. Notes to patient:  Monitor Blood Pressure Daily and keep a log for primary care physician.  You may need to make changes to your medications with rapid weight loss.     mometasone 50 MCG/ACT nasal spray Commonly known as:  NASONEX Place 2 sprays into the nose daily as needed  (congestion).   montelukast 10 MG tablet Commonly known as:  SINGULAIR Take 10 mg by mouth at bedtime.   multivitamin with minerals Tabs tablet Take 1 tablet by mouth daily.   TRELEGY ELLIPTA 100-62.5-25 MCG/INH Aepb Generic drug:  Fluticasone-Umeclidin-Vilant Inhale 1 puff into the lungs daily.      Follow-up Information    Clovis Riley, MD. Go on 06/29/2016.   Specialty:  General Surgery Why:  at [address] Contact information: [phone]        Clovis Riley, MD Follow up.   Specialty:  General Surgery Contact information: [phone]            The results of significant diagnostics  from this hospitalization (including imaging, microbiology, ancillary and laboratory) are listed below for reference.    Significant Diagnostic Studies: No results found.  Labs: Basic Metabolic Panel:  Recent Labs Lab 06/05/16 0453 06/06/16 0444  NA 141 141  K 3.9 3.6  CL 104 107  CO2 26 26  GLUCOSE 277* 167*  BUN 30* 28*  CREATININE 1.87* 1.59*  CALCIUM 8.5* 8.3*  MG 1.3* 1.9   Liver Function Tests:  Recent Labs Lab 06/05/16 0453  AST 111*  ALT 96*  ALKPHOS 41  BILITOT 0.6  PROT 6.7  ALBUMIN 3.4*    CBC:  Recent Labs Lab 06/04/16 1446 06/05/16 0453 06/06/16 0444  WBC  --  13.6* 13.7*  NEUTROABS  --  11.9* 11.1*  HGB 13.6 12.7* 12.6*  HCT 42.4 39.7 39.6  MCV  --  85.7 86.7  PLT  --  202 177    CBG:  Recent Labs Lab 06/05/16 1957 06/06/16 0028 06/06/16 0406 06/06/16 0729 06/06/16 0926  GLUCAP 161* 157* 148* 181* 176*    Active Problems:   Morbid obesity [facility])    Signed:  [facility], [place] [phone] 06/06/2016, 10:20 AM

## 2016-06-07 ENCOUNTER — Encounter (HOSPITAL_COMMUNITY): Payer: Self-pay | Admitting: Surgery

## 2016-06-12 ENCOUNTER — Encounter (HOSPITAL_COMMUNITY): Payer: Self-pay | Admitting: Surgery

## 2016-06-13 ENCOUNTER — Ambulatory Visit (INDEPENDENT_AMBULATORY_CARE_PROVIDER_SITE_OTHER): Payer: Medicare Other | Admitting: "Endocrinology

## 2016-06-13 ENCOUNTER — Encounter: Payer: Self-pay | Admitting: "Endocrinology

## 2016-06-13 VITALS — BP 125/73 | HR 59 | Ht 71.0 in | Wt 236.0 lb

## 2016-06-13 DIAGNOSIS — E1122 Type 2 diabetes mellitus with diabetic chronic kidney disease: Secondary | ICD-10-CM | POA: Diagnosis not present

## 2016-06-13 DIAGNOSIS — I10 Essential (primary) hypertension: Secondary | ICD-10-CM

## 2016-06-13 DIAGNOSIS — E6609 Other obesity due to excess calories: Secondary | ICD-10-CM

## 2016-06-13 DIAGNOSIS — E1165 Type 2 diabetes mellitus with hyperglycemia: Secondary | ICD-10-CM

## 2016-06-13 DIAGNOSIS — Z6832 Body mass index (BMI) 32.0-32.9, adult: Secondary | ICD-10-CM

## 2016-06-13 DIAGNOSIS — E782 Mixed hyperlipidemia: Secondary | ICD-10-CM

## 2016-06-13 DIAGNOSIS — IMO0002 Reserved for concepts with insufficient information to code with codable children: Secondary | ICD-10-CM

## 2016-06-13 DIAGNOSIS — N183 Chronic kidney disease, stage 3 (moderate): Secondary | ICD-10-CM | POA: Diagnosis not present

## 2016-06-13 DIAGNOSIS — Z794 Long term (current) use of insulin: Secondary | ICD-10-CM | POA: Diagnosis not present

## 2016-06-13 MED ORDER — METFORMIN HCL 500 MG PO TABS: 500.0000 mg | ORAL_TABLET | Freq: Two times a day (BID) | ORAL | 2 refills | Status: DC

## 2016-06-13 MED ORDER — INSULIN GLARGINE 100 UNIT/ML SOLOSTAR PEN: 50.0000 [IU] | PEN_INJECTOR | Freq: Every day | SUBCUTANEOUS | 2 refills | Status: DC

## 2016-06-13 NOTE — Patient Instructions (Signed)

## 2016-06-13 NOTE — Progress Notes (Signed)
Subjective:    Patient ID: Allen [name], male    DOB: December 22, 1950. Patient is being seen in f/u for management of diabetes requested by  Terrial Rhodes, MD  Past Medical History:  Diagnosis Date  . Arthritis    mild  . Asthma   . Cancer (HCC)    hx of skin cancer  . Chronic kidney disease    stage 3  . Diabetes mellitus, type II ([place])    type 2  . GERD (gastroesophageal reflux disease)   . Hyperlipidemia   . Hypertension   . Pneumonia   . Sleep apnea    cpap   Past Surgical History:  Procedure Laterality Date  . carpal tunnel    . CERVICAL VERTEBRAE EXCISION     and cleaning  . CHOLECYSTECTOMY    . GASTRIC ROUX-EN-Y N/A 06/04/2016   Procedure: LAPAROSCOPIC ROUX-EN-Y GASTRIC BYPASS WITH UPPER ENDOSCOPY;  Surgeon: Clovis Riley, MD;  Location: [facility];  Service: General;  Laterality: N/A;  . NASAL SINUS SURGERY    . rouen y     06/04/16  . TRIGGER FINGER RELEASE     Social History   Social History  . Marital status: Married    Spouse name: N/A  . Number of children: N/A  . Years of education: N/A   Social History Main Topics  . Smoking status: Never Smoker  . Smokeless tobacco: Never Used  . Alcohol use No  . Drug use: No  . Sexual activity: Yes   Other Topics Concern  . None   Social History Narrative  . None   Outpatient Encounter Prescriptions as of 06/13/2016  Medication Sig  . amLODipine (NORVASC) 10 MG tablet Take 10 mg by mouth daily.  Marland Kitchen atorvastatin (LIPITOR) 20 MG tablet Take 20 mg by mouth daily at 6 PM.   . beclomethasone (QVAR) 80 MCG/ACT inhaler Inhale 1 puff into the lungs 2 (two) times daily.  Marland Kitchen eplerenone (INSPRA) 50 MG tablet Take 50 mg by mouth daily.  . fenofibrate (TRICOR) 145 MG tablet Take 145 mg by mouth every evening.   . Fluticasone-Umeclidin-Vilant (TRELEGY ELLIPTA) 100-62.5-25 MCG/INH AEPB Inhale 1 puff into the lungs daily.  Marland Kitchen gabapentin (NEURONTIN) 300 MG capsule Take 300 mg by mouth at bedtime as needed (cough).    . Insulin Glargine (LANTUS SOLOSTAR) 100 UNIT/ML Solostar Pen Inject 50 Units into the skin at bedtime.  Marland Kitchen levocetirizine (XYZAL) 5 MG tablet Take 5 mg by mouth every evening.  Marland Kitchen losartan (COZAAR) 100 MG tablet Take 100 mg by mouth daily.  . metFORMIN (GLUCOPHAGE) 500 MG tablet Take 1 tablet (500 mg total) by mouth 2 (two) times daily with a meal.  . mometasone (NASONEX) 50 MCG/ACT nasal spray Place 2 sprays into the nose daily as needed (congestion).   . montelukast (SINGULAIR) 10 MG tablet Take 10 mg by mouth at bedtime.  . Multiple Vitamin (MULTIVITAMIN WITH MINERALS) TABS tablet Take 1 tablet by mouth daily.  . Omega-3 Fatty Acids (FISH OIL) 1000 MG CAPS Take 1,000 mg by mouth 2 (two) times daily.   . [DISCONTINUED] Insulin Glargine (LANTUS SOLOSTAR) 100 UNIT/ML Solostar Pen Inject 45 Units into the skin at bedtime.  . [DISCONTINUED] metoprolol (LOPRESSOR) 50 MG tablet Take 50 mg by mouth every morning.   No facility-administered encounter medications on file as of 06/13/2016.    ALLERGIES: No Known Allergies VACCINATION STATUS:  There is no immunization history on file for this patient.  Diabetes  He presents for his follow-up diabetic visit. He has type 2 diabetes mellitus. Onset time: He was diagnosed at approximate age of 76 years. His disease course has been improving. There are no hypoglycemic associated symptoms. Pertinent negatives for hypoglycemia include no confusion, headaches, pallor or seizures. Pertinent negatives for diabetes include no chest pain, no fatigue, no polydipsia, no polyphagia, no polyuria and no weakness. There are no hypoglycemic complications. Symptoms are improving. Diabetic complications include nephropathy. Risk factors for coronary artery disease include diabetes mellitus, dyslipidemia, hypertension, male sex, sedentary lifestyle and obesity. Current diabetic treatment includes insulin injections (He is on Lantus 78 units nightly and NovoLog 30-40 units 3  times a day before meals.). His weight is decreasing rapidly (He is status post Roux-en-Y gastric bypass.). He is following a generally unhealthy diet. When asked about meal planning, he reported none. He has not had a previous visit with a dietitian. He participates in exercise intermittently (He participates in golfing activities.). His breakfast blood glucose range is generally 180-200 mg/dl. His lunch blood glucose range is generally 180-200 mg/dl. His dinner blood glucose range is generally 180-200 mg/dl. His overall blood glucose range is 180-200 mg/dl. An ACE inhibitor/angiotensin II receptor blocker is being taken. Eye exam is current.  Hyperlipidemia  This is a chronic problem. The current episode started more than 1 year ago. The problem is uncontrolled. Exacerbating diseases include chronic renal disease, diabetes and obesity. Pertinent negatives include no chest pain, myalgias or shortness of breath. Current antihyperlipidemic treatment includes statins and bile acid squestrants. Compliance problems include adherence to diet.  Risk factors for coronary artery disease include diabetes mellitus, dyslipidemia, hypertension, male sex, obesity and a sedentary lifestyle.  Hypertension  This is a chronic problem. The current episode started more than 1 year ago. The problem is uncontrolled. Pertinent negatives include no chest pain, headaches, neck pain, palpitations or shortness of breath. Risk factors for coronary artery disease include diabetes mellitus. Past treatments include angiotensin blockers. Hypertensive end-organ damage includes kidney disease. Identifiable causes of hypertension include chronic renal disease.     Review of Systems  Constitutional: Negative for chills, fatigue, fever and unexpected weight change.  HENT: Negative for dental problem, mouth sores and trouble swallowing.   Eyes: Negative for visual disturbance.  Respiratory: Negative for cough, choking, chest tightness,  shortness of breath and wheezing.   Cardiovascular: Negative for chest pain, palpitations and leg swelling.  Gastrointestinal: Negative for abdominal distention, abdominal pain, constipation, diarrhea, nausea and vomiting.  Endocrine: Negative for polydipsia, polyphagia and polyuria.  Genitourinary: Negative for dysuria, flank pain, hematuria and urgency.  Musculoskeletal: Negative for back pain, gait problem, myalgias and neck pain.  Skin: Negative for pallor, rash and wound.  Neurological: Negative for seizures, syncope, weakness, numbness and headaches.  Psychiatric/Behavioral: Negative.  Negative for confusion and dysphoric mood.    Objective:    BP 125/73   Pulse (!) 59   Ht 5\' 11"  (1.803 m)   Wt 236 lb (107 kg)   BMI 32.92 kg/m   Wt Readings from Last 3 Encounters:  06/13/16 236 lb (107 kg)  06/05/16 252 lb 14.4 oz (114.7 kg)  05/28/16 255 lb 11.2 oz (116 kg)    Physical Exam  Constitutional: He is oriented to person, place, and time. He appears well-developed and well-nourished. He is cooperative. No distress.  HENT:  Head: Normocephalic and atraumatic.  Eyes: EOM are normal.  Neck: Normal range of motion. Neck supple. No tracheal deviation present. No thyromegaly present.  Cardiovascular: Normal rate, S1 normal, S2 normal and normal heart sounds.  Exam reveals no gallop.   No murmur heard. Pulses:      Dorsalis pedis pulses are 1+ on the right side, and 1+ on the left side.       Posterior tibial pulses are 1+ on the right side, and 1+ on the left side.  Pulmonary/Chest: Breath sounds normal. No respiratory distress. He has no wheezes.  Abdominal: Soft. Bowel sounds are normal. He exhibits no distension. There is no tenderness. There is no guarding and no CVA tenderness.  Musculoskeletal: He exhibits no edema.       Right shoulder: He exhibits no swelling and no deformity.  Neurological: He is alert and oriented to person, place, and time. He has normal strength and  normal reflexes. No cranial nerve deficit or sensory deficit. Gait normal.  Skin: Skin is warm and dry. No rash noted. No cyanosis. Nails show no clubbing.  Psychiatric: He has a normal mood and affect. His speech is normal and behavior is normal. Judgment and thought content normal. Cognition and memory are normal.    Assessment & Plan:   1. Uncontrolled type 2 diabetes mellitus with stage 3 chronic kidney disease, with long-term current use of insulin ([place]) - Patient has currently uncontrolled symptomatic type 2 DM since  66 years of age,  with recent A1c of  9% increasing from 7.9%, after generally improved from 10.4%. - He is status post Roux-en-Y gastric bypass, lost 19 pounds since surgery.  his diabetes is complicated by mild  CKD  and patient remains at a high risk for more acute and chronic complications of diabetes which include CAD, CVA, CKD, retinopathy, and neuropathy. These are all discussed in detail with the patient.  - I have counseled the patient on diet management and weight loss, by adopting a carbohydrate restricted/protein rich diet.  - Suggestion is made for patient to avoid simple carbohydrates   from their diet including Cakes , Desserts, Ice Cream,  Soda (  diet and regular) , Sweet Tea , Candies,  Chips, Cookies, Artificial Sweeteners,   and "Sugar-free" Products . This will help patient to have stable blood glucose profile and potentially avoid unintended weight gain.  - I encouraged the patient to switch to  unprocessed or minimally processed complex starch and increased protein intake (animal or plant source), fruits, and vegetables.  - Patient is advised to stick to a routine mealtimes to eat 3 meals  a day and avoid unnecessary snacks ( to snack only to correct hypoglycemia).  - The patient will be scheduled with Allen [name], RDN, CDE for individualized DM education.  - I have approached patient with the following individualized plan to manage diabetes and  patient agrees:   - I  Will increase Lantus to 50 units daily at bedtime, associated with strict monitoring of glucose  2 times daily-before breakfast and at bedtime - He will hold NovoLog  for now.   -Patient is encouraged to call clinic for blood glucose levels less than 70 or above 200 mg /dl.  -His GFR is 45, I will resume low-dose metformin 500 mg by mouth twice a day.  -Reportedly, he did not tolerate Trulicity . - The low-dose dexamethasone suppression test to screen for endogenous steroid burden, is negative.  - Patient specific target  A1c;  LDL, HDL, Triglycerides, and  Waist Circumference were discussed in detail.  2) BP/HTN: uncontrolled. Continue current medications including ACEI/ARB. 3) Lipids/HPL:  Uncontrolled, triglycerides 267 LDL is 74  continue statins, TriCor and , omega-3 fatty acids.   4)  Weight/Diet: Status post Roux-en-Y gastric bypass, lost 19 pounds. CDE Consult in progress , exercise, and detailed carbohydrates information provided.  5) Chronic Care/Health Maintenance:  -Patient is on ACEI/ARB and Statin medications and encouraged to continue to follow up with Ophthalmology, Podiatrist at least yearly or according to recommendations, and advised to   stay away from smoking. I have recommended yearly flu vaccine and pneumonia vaccination at least every 5 years; moderate intensity exercise for up to 150 minutes weekly; and  sleep for at least 7 hours a day.  - 25 minutes of time was spent on the care of this patient , 50% of which was applied for counseling on diabetes complications and their preventions. - I advised patient to maintain close follow up with Terrial Rhodes, MD for primary care needs.  Follow up plan: - Return in about 9 weeks (around 08/15/2016) for follow up with pre-visit labs, meter, and logs.  Glade Lloyd, MD Phone: [phone]  Fax: [phone]   06/13/2016, 12:58 PM

## 2016-06-14 ENCOUNTER — Telehealth (HOSPITAL_COMMUNITY): Payer: Self-pay

## 2016-06-14 NOTE — Telephone Encounter (Signed)
Made discharge phone call to patient.   Asking the following questions.    1. Do you have someone to care for you now that you are home?  Wife the drill sergent 2. Are you having pain now that is not relieved by your pain medication?  no 3. Are you able to drink the recommended daily amount of fluids (48 ounces minimum/day) and protein (60-80 grams/day) as prescribed by the dietitian or nutritional counselor?  Around 90 grams of protein and over 64 ounces of fluid 4. Are you taking the vitamins and minerals as prescribed?  No problems 5. Do you have the "on call" number to contact your surgeon if you have a problem or question? yes  6. Are your incisions free of redness, swelling or drainage? (If steri strips, address that these can fall off, shower as tolerated) no 7. Have your bowels moved since your surgery?  If not, are you passing gas?  yes 8. Are you up and walking 3-4 times per day? yes  9. Were you provided your discharge medications before your surgery or before you were discharged from the hospital and are you taking them without problem?  yes

## 2016-06-19 ENCOUNTER — Encounter: Payer: Medicare Other | Admitting: Skilled Nursing Facility1

## 2016-06-19 DIAGNOSIS — I10 Essential (primary) hypertension: Secondary | ICD-10-CM | POA: Diagnosis not present

## 2016-06-19 DIAGNOSIS — Z794 Long term (current) use of insulin: Principal | ICD-10-CM

## 2016-06-19 DIAGNOSIS — E1165 Type 2 diabetes mellitus with hyperglycemia: Principal | ICD-10-CM

## 2016-06-19 DIAGNOSIS — N183 Chronic kidney disease, stage 3 (moderate): Principal | ICD-10-CM

## 2016-06-19 DIAGNOSIS — E119 Type 2 diabetes mellitus without complications: Secondary | ICD-10-CM | POA: Diagnosis not present

## 2016-06-19 DIAGNOSIS — E785 Hyperlipidemia, unspecified: Secondary | ICD-10-CM | POA: Diagnosis not present

## 2016-06-19 DIAGNOSIS — E1122 Type 2 diabetes mellitus with diabetic chronic kidney disease: Secondary | ICD-10-CM

## 2016-06-19 DIAGNOSIS — Z713 Dietary counseling and surveillance: Secondary | ICD-10-CM | POA: Diagnosis not present

## 2016-06-19 DIAGNOSIS — IMO0002 Reserved for concepts with insufficient information to code with codable children: Secondary | ICD-10-CM

## 2016-06-20 ENCOUNTER — Encounter: Payer: Self-pay | Admitting: Skilled Nursing Facility1

## 2016-06-20 DIAGNOSIS — J4540 Moderate persistent asthma, uncomplicated: Secondary | ICD-10-CM | POA: Diagnosis not present

## 2016-06-20 DIAGNOSIS — R05 Cough: Secondary | ICD-10-CM | POA: Diagnosis not present

## 2016-06-20 NOTE — Progress Notes (Signed)
Bariatric Class:  Appt start time: 1530 end time:  1630.  2 Week Post-Operative Nutrition Class  Patient was seen on 06/19/2016 for Post-Operative Nutrition education at [facility].   PT reports blood sugars at 60 multiple times and did not seem to comprehend the gravity of these occurences even after educated.   Surgery date: 06/04/2016 Surgery type: RYGB Start weight at [facility]: 250.7 Weight today: 231.8  TANITA  BODY COMP RESULTS  06/20/2016   BMI (kg/m^2) 33.3   Fat Mass (lbs) 73.8   Fat Free Mass (lbs) 158   Total Body Water (lbs) 110.8   The following the learning objectives were met by the patient during this course:  Identifies Phase 3A (Soft, High Proteins) Dietary Goals and will begin from 2 weeks post-operatively to 2 months post-operatively  Identifies appropriate sources of fluids and proteins   States protein recommendations and appropriate sources post-operatively  Identifies the need for appropriate texture modifications, mastication, and bite sizes when consuming solids  Identifies appropriate multivitamin and calcium sources post-operatively  Describes the need for physical activity post-operatively and will follow MD recommendations  States when to call healthcare provider regarding medication questions or post-operative complications  Handouts given during class include:  Phase 3A: Soft, High Protein Diet Handout  Follow-Up Plan: Patient will follow-up at [facility] in 6 weeks for 2 month post-op nutrition visit for diet advancement per MD.

## 2016-07-04 DIAGNOSIS — R05 Cough: Secondary | ICD-10-CM | POA: Diagnosis not present

## 2016-07-04 DIAGNOSIS — J4540 Moderate persistent asthma, uncomplicated: Secondary | ICD-10-CM | POA: Diagnosis not present

## 2016-07-09 DIAGNOSIS — J0121 Acute recurrent ethmoidal sinusitis: Secondary | ICD-10-CM | POA: Diagnosis not present

## 2016-07-09 DIAGNOSIS — J40 Bronchitis, not specified as acute or chronic: Secondary | ICD-10-CM | POA: Diagnosis not present

## 2016-07-18 DIAGNOSIS — R05 Cough: Secondary | ICD-10-CM | POA: Diagnosis not present

## 2016-07-18 DIAGNOSIS — J4540 Moderate persistent asthma, uncomplicated: Secondary | ICD-10-CM | POA: Diagnosis not present

## 2016-07-30 DIAGNOSIS — G4733 Obstructive sleep apnea (adult) (pediatric): Secondary | ICD-10-CM | POA: Diagnosis not present

## 2016-07-30 DIAGNOSIS — E119 Type 2 diabetes mellitus without complications: Secondary | ICD-10-CM | POA: Diagnosis not present

## 2016-07-30 DIAGNOSIS — I10 Essential (primary) hypertension: Secondary | ICD-10-CM | POA: Diagnosis not present

## 2016-07-31 ENCOUNTER — Encounter: Payer: Medicare Other | Attending: Surgery | Admitting: Registered"

## 2016-07-31 ENCOUNTER — Encounter: Payer: Self-pay | Admitting: Registered"

## 2016-07-31 DIAGNOSIS — I10 Essential (primary) hypertension: Secondary | ICD-10-CM | POA: Insufficient documentation

## 2016-07-31 DIAGNOSIS — E1122 Type 2 diabetes mellitus with diabetic chronic kidney disease: Secondary | ICD-10-CM

## 2016-07-31 DIAGNOSIS — Z713 Dietary counseling and surveillance: Secondary | ICD-10-CM | POA: Diagnosis not present

## 2016-07-31 DIAGNOSIS — E785 Hyperlipidemia, unspecified: Secondary | ICD-10-CM | POA: Diagnosis not present

## 2016-07-31 DIAGNOSIS — E119 Type 2 diabetes mellitus without complications: Secondary | ICD-10-CM | POA: Diagnosis not present

## 2016-07-31 DIAGNOSIS — N183 Chronic kidney disease, stage 3 (moderate): Secondary | ICD-10-CM

## 2016-07-31 NOTE — Progress Notes (Signed)
Follow-up visit:  8 Weeks Post-Operative RYGB Surgery  Medical Nutrition Therapy:  Appt start time: 8:00 end time:  8:40.  Primary concerns today: Post-operative Bariatric Surgery Nutrition Management.  Non scale victories: more energy, taking less medications, asthma has improved   Surgery date: 06/04/2016 Surgery type: RYGB Start weight at [facility]: 250.7 lbs Weight today: 217.4 lbs Weight change: 14.4 lbs from 231.8 lbs (06/19/2016) Total weight lost: 33.3 lbs Weight loss goal: 200 lbs   TANITA  BODY COMP RESULTS  06/20/2016 07/31/2016   BMI (kg/m^2) 33.3 30.3   Fat Mass (lbs) 73.8 56.6   Fat Free Mass (lbs) 158 160.8   Total Body Water (lbs) 110.8 110.2   Pt arrives with wife. Pt states he feels great. Pt reports using protein shakes to help him meet 80g of protein daily. Pt's wife states surgery has worked well for them, especially helping with reducing diabetes medications. Pt's wife reports pt was constipated at one point and she administered an enema for her husband. Pt reports constipation has improved since then.  Preferred Learning Style:   No preference indicated   Learning Readiness:   Ready  Change in progress  24-hr recall: B (AM): 2 eggs (12g) sometimes with cheese (6g) Snk (AM): greek yogurt (15g)  L (PM): deli ham w/ cheese (14g) Snk (PM): Premier protein shake (30g)  D (PM): chili or chicken (14-21g) or tuna (14-21g) or salmon (14-21g) Snk (PM): sugar-free popsicles, sugar-free jello  Fluid intake: protein shake (11oz), water, sugar-free lemonade, unsweet tea, decaf coffee; 64+ oz Estimated total protein intake: 80+ grams   Medications: no longer taking blood pressure medications, no insulin other than Lantus (night time) Supplementation: Bariatric Advantage 2x/day and Ca supplements 3x/day  CBG monitoring: yes, 2-3x/day Average CBG per patient: 110-115 Last patient reported A1c: not stated  Using straws: no Drinking while eating: no Having you  been chewing well: yes Chewing/swallowing difficulties: no Changes in vision: some, seeing optometrist who says it is normal  Changes to mood/headaches: no Hair loss/Changes to skin/Changes to nails: no, no, no Any difficulty focusing or concentrating: no Sweating: no Dizziness/Lightheaded: no Palpitations: no  Carbonated beverages: no N/V/D/C/GAS: no, no, no, yes, no Abdominal Pain: no Dumping syndrome: no Last Lap-Band fill: N/A  Recent physical activity:  Golfing, walking   Progress Towards Goal(s):  In progress.  Handouts given during visit include:  Phase IIIB: High Protein + NS vegetables   Nutritional Diagnosis:  NI-5.8.5 Inadeqate fiber intake As related to bariatric surgery post-op diet.  As evidenced by pt report of constipation.    Intervention:  Nutrition education and counseling. Goals:  Follow Phase 3B: High Protein + Non-Starchy Vegetables  Eat 3-6 small meals/snacks, every 3-5 hrs  Increase lean protein foods to meet 80g goal  Increase fluid intake to 64oz +  Avoid drinking 15 minutes before, during and 30 minutes after eating  Aim for >30 min of physical activity daily  Teaching Method Utilized:  Visual Auditory  Barriers to learning/adherence to lifestyle change: none  Demonstrated degree of understanding via:  Teach Back   Monitoring/Evaluation:  Dietary intake, exercise, lap band fills, and body weight. Follow up in 3 months for 5 month post-op visit.

## 2016-08-01 DIAGNOSIS — J4540 Moderate persistent asthma, uncomplicated: Secondary | ICD-10-CM | POA: Diagnosis not present

## 2016-08-01 DIAGNOSIS — R05 Cough: Secondary | ICD-10-CM | POA: Diagnosis not present

## 2016-08-09 DIAGNOSIS — E1122 Type 2 diabetes mellitus with diabetic chronic kidney disease: Secondary | ICD-10-CM | POA: Diagnosis not present

## 2016-08-09 DIAGNOSIS — N183 Chronic kidney disease, stage 3 (moderate): Secondary | ICD-10-CM | POA: Diagnosis not present

## 2016-08-09 DIAGNOSIS — Z794 Long term (current) use of insulin: Secondary | ICD-10-CM | POA: Diagnosis not present

## 2016-08-09 DIAGNOSIS — E1165 Type 2 diabetes mellitus with hyperglycemia: Secondary | ICD-10-CM | POA: Diagnosis not present

## 2016-08-10 LAB — CMP14+EGFR
A/G RATIO: 1.9 (ref 1.2–2.2)
ALBUMIN: 4.3 g/dL (ref 3.6–4.8)
ALT: 23 IU/L (ref 0–44)
AST: 32 IU/L (ref 0–40)
Alkaline Phosphatase: 45 IU/L (ref 39–117)
BILIRUBIN TOTAL: 0.6 mg/dL (ref 0.0–1.2)
BUN / CREAT RATIO: 15 (ref 10–24)
BUN: 23 mg/dL (ref 8–27)
CHLORIDE: 105 mmol/L (ref 96–106)
CO2: 25 mmol/L (ref 20–29)
Calcium: 9.4 mg/dL (ref 8.6–10.2)
Creatinine, Ser: 1.57 mg/dL — ABNORMAL HIGH (ref 0.76–1.27)
GFR calc non Af Amer: 45 mL/min/{1.73_m2} — ABNORMAL LOW (ref >59)
GFR, EST AFRICAN AMERICAN: 52 mL/min/{1.73_m2} — AB (ref >59)
GLOBULIN, TOTAL: 2.3 g/dL (ref 1.5–4.5)
Glucose: 104 mg/dL — ABNORMAL HIGH (ref 65–99)
POTASSIUM: 4.4 mmol/L (ref 3.5–5.2)
SODIUM: 145 mmol/L — AB (ref 134–144)
Total Protein: 6.6 g/dL (ref 6.0–8.5)

## 2016-08-10 LAB — HEMOGLOBIN A1C
ESTIMATED AVERAGE GLUCOSE: 140 mg/dL
Hgb A1c MFr Bld: 6.5 % — ABNORMAL HIGH (ref 4.8–5.6)

## 2016-08-15 DIAGNOSIS — J4540 Moderate persistent asthma, uncomplicated: Secondary | ICD-10-CM | POA: Diagnosis not present

## 2016-08-15 DIAGNOSIS — R05 Cough: Secondary | ICD-10-CM | POA: Diagnosis not present

## 2016-08-16 ENCOUNTER — Encounter: Payer: Self-pay | Admitting: "Endocrinology

## 2016-08-16 ENCOUNTER — Ambulatory Visit (INDEPENDENT_AMBULATORY_CARE_PROVIDER_SITE_OTHER): Payer: Medicare Other | Admitting: "Endocrinology

## 2016-08-16 VITALS — BP 129/77 | HR 62 | Ht 71.0 in | Wt 217.0 lb

## 2016-08-16 DIAGNOSIS — I10 Essential (primary) hypertension: Secondary | ICD-10-CM | POA: Diagnosis not present

## 2016-08-16 DIAGNOSIS — E782 Mixed hyperlipidemia: Secondary | ICD-10-CM | POA: Diagnosis not present

## 2016-08-16 DIAGNOSIS — N183 Chronic kidney disease, stage 3 (moderate): Secondary | ICD-10-CM

## 2016-08-16 DIAGNOSIS — Z6832 Body mass index (BMI) 32.0-32.9, adult: Secondary | ICD-10-CM | POA: Diagnosis not present

## 2016-08-16 DIAGNOSIS — Z794 Long term (current) use of insulin: Secondary | ICD-10-CM | POA: Diagnosis not present

## 2016-08-16 DIAGNOSIS — E1122 Type 2 diabetes mellitus with diabetic chronic kidney disease: Secondary | ICD-10-CM | POA: Diagnosis not present

## 2016-08-16 DIAGNOSIS — E6609 Other obesity due to excess calories: Secondary | ICD-10-CM | POA: Diagnosis not present

## 2016-08-16 DIAGNOSIS — E1165 Type 2 diabetes mellitus with hyperglycemia: Secondary | ICD-10-CM | POA: Diagnosis not present

## 2016-08-16 DIAGNOSIS — IMO0002 Reserved for concepts with insufficient information to code with codable children: Secondary | ICD-10-CM

## 2016-08-16 NOTE — Patient Instructions (Signed)

## 2016-08-16 NOTE — Progress Notes (Signed)
Subjective:    Patient ID: Allen [name], male    DOB: September 04, 1950. Patient is being seen in f/u for management of diabetes requested by  Terrial Rhodes, MD  Past Medical History:  Diagnosis Date  . Arthritis    mild  . Asthma   . Cancer (HCC)    hx of skin cancer  . Chronic kidney disease    stage 3  . Diabetes mellitus, type II ([place])    type 2  . GERD (gastroesophageal reflux disease)   . Hyperlipidemia   . Hypertension   . Pneumonia   . Sleep apnea    cpap   Past Surgical History:  Procedure Laterality Date  . carpal tunnel    . CERVICAL VERTEBRAE EXCISION     and cleaning  . CHOLECYSTECTOMY    . GASTRIC ROUX-EN-Y N/A 06/04/2016   Procedure: LAPAROSCOPIC ROUX-EN-Y GASTRIC BYPASS WITH UPPER ENDOSCOPY;  Surgeon: Clovis Riley, MD;  Location: [facility];  Service: General;  Laterality: N/A;  . NASAL SINUS SURGERY    . rouen y     06/04/16  . TRIGGER FINGER RELEASE     Social History   Social History  . Marital status: Married    Spouse name: N/A  . Number of children: N/A  . Years of education: N/A   Social History Main Topics  . Smoking status: Never Smoker  . Smokeless tobacco: Never Used  . Alcohol use No  . Drug use: No  . Sexual activity: Yes   Other Topics Concern  . Not on file   Social History Narrative  . No narrative on file   Outpatient Encounter Prescriptions as of 08/16/2016  Medication Sig  . amLODipine (NORVASC) 10 MG tablet Take 10 mg by mouth daily.  Marland Kitchen atorvastatin (LIPITOR) 20 MG tablet Take 20 mg by mouth daily at 6 PM.   . beclomethasone (QVAR) 80 MCG/ACT inhaler Inhale 1 puff into the lungs 2 (two) times daily.  Marland Kitchen eplerenone (INSPRA) 50 MG tablet Take 50 mg by mouth daily.  . fenofibrate (TRICOR) 145 MG tablet Take 145 mg by mouth every evening.   . Fluticasone-Umeclidin-Vilant (TRELEGY ELLIPTA) 100-62.5-25 MCG/INH AEPB Inhale 1 puff into the lungs daily.  Marland Kitchen gabapentin (NEURONTIN) 300 MG capsule Take 300 mg by mouth at  bedtime as needed (cough).   Marland Kitchen levocetirizine (XYZAL) 5 MG tablet Take 5 mg by mouth every evening.  Marland Kitchen losartan (COZAAR) 100 MG tablet Take 100 mg by mouth daily.  . metFORMIN (GLUCOPHAGE) 500 MG tablet Take 1 tablet (500 mg total) by mouth 2 (two) times daily with a meal. (Patient not taking: Reported on 08/16/2016)  . mometasone (NASONEX) 50 MCG/ACT nasal spray Place 2 sprays into the nose daily as needed (congestion).   . montelukast (SINGULAIR) 10 MG tablet Take 10 mg by mouth at bedtime.  . Multiple Vitamin (MULTIVITAMIN WITH MINERALS) TABS tablet Take 1 tablet by mouth daily.  . Omega-3 Fatty Acids (FISH OIL) 1000 MG CAPS Take 1,000 mg by mouth 2 (two) times daily.   . [DISCONTINUED] Insulin Glargine (LANTUS SOLOSTAR) 100 UNIT/ML Solostar Pen Inject 50 Units into the skin at bedtime.   No facility-administered encounter medications on file as of 08/16/2016.    ALLERGIES: No Known Allergies VACCINATION STATUS:  There is no immunization history on file for this patient.  Diabetes  He presents for his follow-up diabetic visit. He has type 2 diabetes mellitus. Onset time: He was diagnosed at approximate age  of 45 years. His disease course has been improving. There are no hypoglycemic associated symptoms. Pertinent negatives for hypoglycemia include no confusion, headaches, pallor or seizures. Pertinent negatives for diabetes include no chest pain, no fatigue, no polydipsia, no polyphagia, no polyuria and no weakness. There are no hypoglycemic complications. Symptoms are improving. Diabetic complications include nephropathy. Risk factors for coronary artery disease include diabetes mellitus, dyslipidemia, hypertension, male sex, sedentary lifestyle and obesity. Current diabetic treatment includes insulin injections (He is on Lantus 78 units nightly and NovoLog 30-40 units 3 times a day before meals.). His weight is decreasing rapidly (He is status post Roux-en-Y gastric bypass- he has lost 49  pounds overall.). He is following a generally unhealthy and diabetic diet. When asked about meal planning, he reported none. He has not had a previous visit with a dietitian. He participates in exercise intermittently (He participates in golfing activities.). His breakfast blood glucose range is generally 70-90 mg/dl. His lunch blood glucose range is generally 180-200 mg/dl. His dinner blood glucose range is generally 180-200 mg/dl. His bedtime blood glucose range is generally 110-130 mg/dl. His overall blood glucose range is 180-200 mg/dl. An ACE inhibitor/angiotensin II receptor blocker is being taken. Eye exam is current.  Hyperlipidemia  This is a chronic problem. The current episode started more than 1 year ago. The problem is uncontrolled. Exacerbating diseases include chronic renal disease, diabetes and obesity. Pertinent negatives include no chest pain, myalgias or shortness of breath. Current antihyperlipidemic treatment includes statins and bile acid squestrants. Compliance problems include adherence to diet.  Risk factors for coronary artery disease include diabetes mellitus, dyslipidemia, hypertension, male sex, obesity and a sedentary lifestyle.  Hypertension  This is a chronic problem. The current episode started more than 1 year ago. The problem is uncontrolled. Pertinent negatives include no chest pain, headaches, neck pain, palpitations or shortness of breath. Risk factors for coronary artery disease include diabetes mellitus. Past treatments include angiotensin blockers. Hypertensive end-organ damage includes kidney disease. Identifiable causes of hypertension include chronic renal disease.     Review of Systems  Constitutional: Negative for chills, fatigue, fever and unexpected weight change.  HENT: Negative for dental problem, mouth sores and trouble swallowing.   Eyes: Negative for visual disturbance.  Respiratory: Negative for cough, choking, chest tightness, shortness of breath and  wheezing.   Cardiovascular: Negative for chest pain, palpitations and leg swelling.  Gastrointestinal: Negative for abdominal distention, abdominal pain, constipation, diarrhea, nausea and vomiting.  Endocrine: Negative for polydipsia, polyphagia and polyuria.  Genitourinary: Negative for dysuria, flank pain, hematuria and urgency.  Musculoskeletal: Negative for back pain, gait problem, myalgias and neck pain.  Skin: Negative for pallor, rash and wound.  Neurological: Negative for seizures, syncope, weakness, numbness and headaches.  Psychiatric/Behavioral: Negative.  Negative for confusion and dysphoric mood.    Objective:    BP 129/77   Pulse 62   Ht 5\' 11"  (1.803 m)   Wt 217 lb (98.4 kg)   BMI 30.27 kg/m   Wt Readings from Last 3 Encounters:  08/16/16 217 lb (98.4 kg)  07/31/16 217 lb 6.4 oz (98.6 kg)  06/20/16 231 lb 12.8 oz (105.1 kg)    Physical Exam  Constitutional: He is oriented to person, place, and time. He appears well-developed and well-nourished. He is cooperative. No distress.  HENT:  Head: Normocephalic and atraumatic.  Eyes: EOM are normal.  Neck: Normal range of motion. Neck supple. No tracheal deviation present. No thyromegaly present.  Cardiovascular: Normal rate, S1 normal,  S2 normal and normal heart sounds.  Exam reveals no gallop.   No murmur heard. Pulses:      Dorsalis pedis pulses are 1+ on the right side, and 1+ on the left side.       Posterior tibial pulses are 1+ on the right side, and 1+ on the left side.  Pulmonary/Chest: Breath sounds normal. No respiratory distress. He has no wheezes.  Abdominal: Soft. Bowel sounds are normal. He exhibits no distension. There is no tenderness. There is no guarding and no CVA tenderness.  Musculoskeletal: He exhibits no edema.       Right shoulder: He exhibits no swelling and no deformity.  Neurological: He is alert and oriented to person, place, and time. He has normal strength and normal reflexes. No cranial  nerve deficit or sensory deficit. Gait normal.  Skin: Skin is warm and dry. No rash noted. No cyanosis. Nails show no clubbing.  Psychiatric: He has a normal mood and affect. His speech is normal and behavior is normal. Judgment and thought content normal. Cognition and memory are normal.    Assessment & Plan:   1. Uncontrolled type 2 diabetes mellitus with stage 3 chronic kidney disease, with long-term current use of insulin ([place]) - Patient has currently uncontrolled symptomatic type 2 DM since  66 years of age. - His A1c has improved to 6.5%, generally improved from 10.4%. - He is status post Roux-en-Y gastric bypass, lost 49 pounds since surgery overall.  his diabetes is complicated by mild  CKD  and patient remains at a high risk for more acute and chronic complications of diabetes which include CAD, CVA, CKD, retinopathy, and neuropathy. These are all discussed in detail with the patient.  - I have counseled the patient on diet management and weight loss, by adopting a carbohydrate restricted/protein rich diet.  - Suggestion is made for patient to avoid simple carbohydrates   from his diet including Cakes , Desserts, Ice Cream,  Soda (  diet and regular) , Sweet Tea , Candies,  Chips, Cookies, Artificial Sweeteners,   and "Sugar-free" Products . This will help patient to have stable blood glucose profile and potentially avoid unintended weight gain.  - I encouraged the patient to switch to  unprocessed or minimally processed complex starch and increased protein intake (animal or plant source), fruits, and vegetables.  - Patient is advised to stick to a routine mealtimes to eat 3 meals  a day and avoid unnecessary snacks ( to snack only to correct hypoglycemia).  - The patient will be scheduled with Jearld Fenton, RDN, CDE for individualized DM education.  - I have approached patient with the following individualized plan to manage diabetes and patient agrees:   - I  advised him to  discontinue all insulin, due to fasting hypoglycemia. - He will monitor blood glucose as needed and advised to report any therapy of 70 or above 200x 3.  -His GFR is 45, I will resume low-dose metformin 500 mg by mouth twice a day ( he did not start metformin has advised last visit) -Reportedly, he did not tolerate Trulicity . - The low-dose dexamethasone suppression test to screen for endogenous steroid burden, is negative.  - Patient specific target  A1c;  LDL, HDL, Triglycerides, and  Waist Circumference were discussed in detail.  2) BP/HTN: controlled. Continue current medications including ACEI/ARB. 3) Lipids/HPL:  Uncontrolled, triglycerides 267 LDL is 74  continue statins, TriCor and , omega-3 fatty acids.   4)  Weight/Diet:  Status post Roux-en-Y gastric bypass, lost 49 pounds overall. CDE Consult in progress , exercise, and detailed carbohydrates information provided.  5) Chronic Care/Health Maintenance:  -Patient is on ACEI/ARB and Statin medications and encouraged to continue to follow up with Ophthalmology, Podiatrist at least yearly or according to recommendations, and advised to   stay away from smoking. I have recommended yearly flu vaccine and pneumonia vaccination at least every 5 years; moderate intensity exercise for up to 150 minutes weekly; and  sleep for at least 7 hours a day.  - 25 minutes of time was spent on the care of this patient , 50% of which was applied for counseling on diabetes complications and their preventions. - I advised patient to maintain close follow up with Terrial Rhodes, MD for primary care needs.  Follow up plan: He'll return in 3 months with repeat CMP and A1c. Glade Lloyd, MD Phone: [phone]  Fax: [phone]   08/16/2016, 1:24 PM

## 2016-08-27 DIAGNOSIS — M545 Low back pain: Secondary | ICD-10-CM | POA: Diagnosis not present

## 2016-08-29 DIAGNOSIS — J4540 Moderate persistent asthma, uncomplicated: Secondary | ICD-10-CM | POA: Diagnosis not present

## 2016-08-29 DIAGNOSIS — R05 Cough: Secondary | ICD-10-CM | POA: Diagnosis not present

## 2016-09-07 ENCOUNTER — Ambulatory Visit (HOSPITAL_COMMUNITY)
Admission: RE | Admit: 2016-09-07 | Discharge: 2016-09-07 | Disposition: A | Discharge status: Home / Self Care | Payer: Medicare Other | Source: Ambulatory Visit | Attending: General Surgery | Admitting: General Surgery

## 2016-09-07 ENCOUNTER — Other Ambulatory Visit: Payer: Self-pay | Admitting: Surgery

## 2016-09-07 DIAGNOSIS — R1110 Vomiting, unspecified: Secondary | ICD-10-CM | POA: Diagnosis not present

## 2016-09-07 DIAGNOSIS — E43 Unspecified severe protein-calorie malnutrition: Secondary | ICD-10-CM

## 2016-09-07 DIAGNOSIS — R1115 Cyclical vomiting syndrome unrelated to migraine: Secondary | ICD-10-CM

## 2016-09-07 DIAGNOSIS — R197 Diarrhea, unspecified: Secondary | ICD-10-CM | POA: Diagnosis not present

## 2016-09-07 DIAGNOSIS — Z9884 Bariatric surgery status: Secondary | ICD-10-CM | POA: Insufficient documentation

## 2016-09-07 LAB — COMPREHENSIVE METABOLIC PANEL
ALT: 16 U/L — AB (ref 17–63)
ANION GAP: 8 (ref 5–15)
AST: 17 U/L (ref 15–41)
Albumin: 4 g/dL (ref 3.5–5.0)
Alkaline Phosphatase: 51 U/L (ref 38–126)
BUN: 37 mg/dL — ABNORMAL HIGH (ref 6–20)
CHLORIDE: 107 mmol/L (ref 101–111)
CO2: 23 mmol/L (ref 22–32)
Calcium: 9.7 mg/dL (ref 8.9–10.3)
Creatinine, Ser: 1.57 mg/dL — ABNORMAL HIGH (ref 0.61–1.24)
GFR calc non Af Amer: 44 mL/min — ABNORMAL LOW (ref >60)
GFR, EST AFRICAN AMERICAN: 51 mL/min — AB (ref >60)
Glucose, Bld: 169 mg/dL — ABNORMAL HIGH (ref 65–99)
Potassium: 4 mmol/L (ref 3.5–5.1)
SODIUM: 138 mmol/L (ref 135–145)
Total Bilirubin: 0.8 mg/dL (ref 0.3–1.2)
Total Protein: 7.5 g/dL (ref 6.5–8.1)

## 2016-09-07 LAB — VITAMIN B12: Vitamin B-12: 2228 pg/mL — ABNORMAL HIGH (ref 180–914)

## 2016-09-07 LAB — CBC
HCT: 43.7 % (ref 39.0–52.0)
Hemoglobin: 14.5 g/dL (ref 13.0–17.0)
MCH: 28 pg (ref 26.0–34.0)
MCHC: 33.2 g/dL (ref 30.0–36.0)
MCV: 84.4 fL (ref 78.0–100.0)
PLATELETS: 255 10*3/uL (ref 150–400)
RBC: 5.18 MIL/uL (ref 4.22–5.81)
RDW: 14.9 % (ref 11.5–15.5)
WBC: 15.7 10*3/uL — ABNORMAL HIGH (ref 4.0–10.5)

## 2016-09-07 MED ORDER — SODIUM CHLORIDE 0.9 % IV SOLN
Freq: Once | INTRAVENOUS | Status: AC
  Administered 2016-09-07: 14:00:00 via INTRAVENOUS

## 2016-09-07 MED ORDER — SODIUM CHLORIDE 0.9 % IV SOLN: INTRAVENOUS | Status: AC

## 2016-09-07 MED ORDER — THIAMINE HCL 100 MG/ML IJ SOLN: Freq: Once | INTRAVENOUS | Status: DC

## 2016-09-07 NOTE — Discharge Instructions (Signed)
Dehydration, Adult Dehydration is when there is not enough fluid or water in your body. This happens when you lose more fluids than you take in. Dehydration can range from mild to very bad. It should be treated right away to keep it from getting very bad. Symptoms of mild dehydration may include:  Thirst.  Dry lips.  Slightly dry mouth.  Dry, warm skin.  Dizziness. Symptoms of moderate dehydration may include:  Very dry mouth.  Muscle cramps.  Dark pee (urine). Pee may be the color of tea.  Your body making less pee.  Your eyes making fewer tears.  Heartbeat that is uneven or faster than normal (palpitations).  Headache.  Light-headedness, especially when you stand up from sitting.  Fainting (syncope). Symptoms of very bad dehydration may include:  Changes in skin, such as: ? Cold and clammy skin. ? Blotchy (mottled) or pale skin. ? Skin that does not quickly return to normal after being lightly pinched and let go (poor skin turgor).  Changes in body fluids, such as: ? Feeling very thirsty. ? Your eyes making fewer tears. ? Not sweating when body temperature is high, such as in hot weather. ? Your body making very little pee.  Changes in vital signs, such as: ? Weak pulse. ? Pulse that is more than 100 beats a minute when you are sitting still. ? Fast breathing. ? Low blood pressure.  Other changes, such as: ? Sunken eyes. ? Cold hands and feet. ? Confusion. ? Lack of energy (lethargy). ? Trouble waking up from sleep. ? Short-term weight loss. ? Unconsciousness. Follow these instructions at home:  If told by your doctor, drink an ORS: ? Make an ORS by using instructions on the package. ? Start by drinking small amounts, about  cup (120 mL) every 5-10 minutes. ? Slowly drink more until you have had the amount that your doctor said to have.  Drink enough clear fluid to keep your pee clear or pale yellow. If you were told to drink an ORS, finish the ORS  first, then start slowly drinking clear fluids. Drink fluids such as: ? Water. Do not drink only water by itself. Doing that can make the salt (sodium) level in your body get too low (hyponatremia). ? Ice chips. ? Fruit juice that you have added water to (diluted). ? Low-calorie sports drinks.  Avoid: ? Alcohol. ? Drinks that have a lot of sugar. These include high-calorie sports drinks, fruit juice that does not have water added, and soda. ? Caffeine. ? Foods that are greasy or have a lot of fat or sugar.  Take over-the-counter and prescription medicines only as told by your doctor.  Do not take salt tablets. Doing that can make the salt level in your body get too high (hypernatremia).  Eat foods that have minerals (electrolytes). Examples include bananas, oranges, potatoes, tomatoes, and spinach.  Keep all follow-up visits as told by your doctor. This is important. Contact a doctor if:  You have belly (abdominal) pain that: ? Gets worse. ? Stays in one area (localizes).  You have a rash.  You have a stiff neck.  You get angry or annoyed more easily than normal (irritability).  You are more sleepy than normal.  You have a harder time waking up than normal.  You feel: ? Weak. ? Dizzy. ? Very thirsty.  You have peed (urinated) only a small amount of very dark pee during 6-8 hours. Get help right away if:  You have symptoms of  very bad dehydration.  You cannot drink fluids without throwing up (vomiting).  Your symptoms get worse with treatment.  You have a fever.  You have a very bad headache.  You are throwing up or having watery poop (diarrhea) and it: ? Gets worse. ? Does not go away.  You have blood or something green (bile) in your throw-up.  You have blood in your poop (stool). This may cause poop to look black and tarry.  You have not peed in 6-8 hours.  You pass out (faint).  Your heart rate when you are sitting still is more than 100 beats a  minute.  You have trouble breathing. This information is not intended to replace advice given to you by your health care provider. Make sure you discuss any questions you have with your health care provider. Document Released: 11/04/2008 Document Revised: 07/29/2015 Document Reviewed: 03/04/2015 Elsevier Interactive Patient Education  2018 [place] received 2 liters of normal saline after blood was drawn for labs.

## 2016-09-07 NOTE — Progress Notes (Addendum)
[facility] to receive 2 liters of normal saline over 2hrs. And also to have labs drawn before the infusion.  Provider: Romana Juniper, MD  Pt tolerated procedure well. Pt was alert, oriented and ambulatory at discharge. D/C instructions given to him with verbal understanding. He was discharge with his wife.

## 2016-09-11 DIAGNOSIS — R197 Diarrhea, unspecified: Secondary | ICD-10-CM | POA: Diagnosis not present

## 2016-09-12 DIAGNOSIS — J4540 Moderate persistent asthma, uncomplicated: Secondary | ICD-10-CM | POA: Diagnosis not present

## 2016-09-12 DIAGNOSIS — R05 Cough: Secondary | ICD-10-CM | POA: Diagnosis not present

## 2016-09-26 DIAGNOSIS — J4540 Moderate persistent asthma, uncomplicated: Secondary | ICD-10-CM | POA: Diagnosis not present

## 2016-09-26 DIAGNOSIS — R05 Cough: Secondary | ICD-10-CM | POA: Diagnosis not present

## 2016-10-01 DIAGNOSIS — Z125 Encounter for screening for malignant neoplasm of prostate: Secondary | ICD-10-CM | POA: Diagnosis not present

## 2016-10-01 DIAGNOSIS — E785 Hyperlipidemia, unspecified: Secondary | ICD-10-CM | POA: Diagnosis not present

## 2016-10-01 DIAGNOSIS — R05 Cough: Secondary | ICD-10-CM | POA: Diagnosis not present

## 2016-10-01 DIAGNOSIS — Z23 Encounter for immunization: Secondary | ICD-10-CM | POA: Diagnosis not present

## 2016-10-01 DIAGNOSIS — E669 Obesity, unspecified: Secondary | ICD-10-CM | POA: Diagnosis not present

## 2016-10-01 DIAGNOSIS — E119 Type 2 diabetes mellitus without complications: Secondary | ICD-10-CM | POA: Diagnosis not present

## 2016-10-01 DIAGNOSIS — J45909 Unspecified asthma, uncomplicated: Secondary | ICD-10-CM | POA: Diagnosis not present

## 2016-10-01 DIAGNOSIS — I10 Essential (primary) hypertension: Secondary | ICD-10-CM | POA: Diagnosis not present

## 2016-10-08 DIAGNOSIS — R001 Bradycardia, unspecified: Secondary | ICD-10-CM | POA: Diagnosis not present

## 2016-10-08 DIAGNOSIS — G4733 Obstructive sleep apnea (adult) (pediatric): Secondary | ICD-10-CM | POA: Diagnosis not present

## 2016-10-08 DIAGNOSIS — I4510 Unspecified right bundle-branch block: Secondary | ICD-10-CM | POA: Diagnosis not present

## 2016-10-08 DIAGNOSIS — I10 Essential (primary) hypertension: Secondary | ICD-10-CM | POA: Diagnosis not present

## 2016-10-10 DIAGNOSIS — R05 Cough: Secondary | ICD-10-CM | POA: Diagnosis not present

## 2016-10-10 DIAGNOSIS — J4540 Moderate persistent asthma, uncomplicated: Secondary | ICD-10-CM | POA: Diagnosis not present

## 2016-10-19 DIAGNOSIS — E119 Type 2 diabetes mellitus without complications: Secondary | ICD-10-CM | POA: Diagnosis not present

## 2016-10-19 DIAGNOSIS — H40023 Open angle with borderline findings, high risk, bilateral: Secondary | ICD-10-CM | POA: Diagnosis not present

## 2016-10-19 DIAGNOSIS — H25813 Combined forms of age-related cataract, bilateral: Secondary | ICD-10-CM | POA: Diagnosis not present

## 2016-10-19 LAB — HM DIABETES EYE EXAM

## 2016-10-29 DIAGNOSIS — Z23 Encounter for immunization: Secondary | ICD-10-CM | POA: Diagnosis not present

## 2016-10-29 DIAGNOSIS — I10 Essential (primary) hypertension: Secondary | ICD-10-CM | POA: Diagnosis not present

## 2016-10-29 DIAGNOSIS — E119 Type 2 diabetes mellitus without complications: Secondary | ICD-10-CM | POA: Diagnosis not present

## 2016-10-29 DIAGNOSIS — E785 Hyperlipidemia, unspecified: Secondary | ICD-10-CM | POA: Diagnosis not present

## 2016-10-29 DIAGNOSIS — F341 Dysthymic disorder: Secondary | ICD-10-CM | POA: Diagnosis not present

## 2016-10-31 ENCOUNTER — Encounter: Payer: Medicare Other | Attending: Surgery | Admitting: Skilled Nursing Facility1

## 2016-10-31 ENCOUNTER — Encounter: Payer: Self-pay | Admitting: Skilled Nursing Facility1

## 2016-10-31 DIAGNOSIS — I10 Essential (primary) hypertension: Secondary | ICD-10-CM | POA: Diagnosis not present

## 2016-10-31 DIAGNOSIS — E119 Type 2 diabetes mellitus without complications: Secondary | ICD-10-CM | POA: Insufficient documentation

## 2016-10-31 DIAGNOSIS — E785 Hyperlipidemia, unspecified: Secondary | ICD-10-CM | POA: Insufficient documentation

## 2016-10-31 DIAGNOSIS — J411 Mucopurulent chronic bronchitis: Secondary | ICD-10-CM | POA: Diagnosis not present

## 2016-10-31 DIAGNOSIS — E1122 Type 2 diabetes mellitus with diabetic chronic kidney disease: Secondary | ICD-10-CM

## 2016-10-31 DIAGNOSIS — Z9884 Bariatric surgery status: Secondary | ICD-10-CM | POA: Diagnosis not present

## 2016-10-31 DIAGNOSIS — J4540 Moderate persistent asthma, uncomplicated: Secondary | ICD-10-CM | POA: Diagnosis not present

## 2016-10-31 DIAGNOSIS — Z713 Dietary counseling and surveillance: Secondary | ICD-10-CM | POA: Insufficient documentation

## 2016-10-31 DIAGNOSIS — N183 Chronic kidney disease, stage 3 (moderate): Secondary | ICD-10-CM

## 2016-10-31 DIAGNOSIS — Z794 Long term (current) use of insulin: Secondary | ICD-10-CM

## 2016-10-31 DIAGNOSIS — G4733 Obstructive sleep apnea (adult) (pediatric): Secondary | ICD-10-CM | POA: Diagnosis not present

## 2016-10-31 DIAGNOSIS — R05 Cough: Secondary | ICD-10-CM | POA: Diagnosis not present

## 2016-10-31 DIAGNOSIS — E1165 Type 2 diabetes mellitus with hyperglycemia: Secondary | ICD-10-CM

## 2016-10-31 DIAGNOSIS — IMO0002 Reserved for concepts with insufficient information to code with codable children: Secondary | ICD-10-CM

## 2016-10-31 NOTE — Patient Instructions (Addendum)
-  Aim for 70 fluid ounces every day  -Cut your popcicles down to 1-2 a day  -Continue to chew very well  -Try kefir every morning (4-8 ounces)   -Eat every 3-5 hours

## 2016-10-31 NOTE — Progress Notes (Signed)
Follow-up visit:  8 Weeks Post-Operative RYGB Surgery  Medical Nutrition Therapy:  Appt start time: 8:00 end time:  8:40.  Primary concerns today: Post-operative Bariatric Surgery Nutrition Management.  Non scale victories: more energy, taking less medications, asthma has improved   Surgery date: 06/04/2016 Surgery type: RYGB [facility]: 250.7 lbs Weight today: 192.8 lbs Weight change: 24.2 lbs  Weight loss goal: 200 lbs   TANITA  BODY COMP RESULTS  06/20/2016 07/31/2016 10/31/2016   BMI (kg/m^2) 33.3 30.3 26.9   Fat Mass (lbs) 73.8 56.6 38.6   Fat Free Mass (lbs) 158 160.8 154.2   Total Body Water (lbs) 110.8 110.2 106   Pt states His A1C is 6.3 and taking metformin but off lipid and blood pressure medication. Lifestyle does not work with eating meals throughout the day. Pt states he will carry a protein bar or protein shake when playing golf. Pt arrives with his supportive wife.   Preferred Learning Style:   No preference indicated   Learning Readiness:   Ready  Change in progress  24-hr recall: B (AM): 2 eggs (12g) and bacon sometimes with cheese (6g) and tomatoes and grits Snk (AM): maybe some beef jerky L (PM):  Snk (PM):  D (5 PM): chili or chicken (14-21g) or tuna (14-21g) or salmon (14-21g) and vegetables  Snk (PM): 3-4 sugar-free popsicles, sugar-free jello  Fluid intake: protein shake (11oz), water, sugar-free lemonade, unsweet tea, decaf coffee; 64+ oz Estimated total protein intake: 80+ grams   Medications: no longer taking blood pressure medications, no insulin other than Lantus (night time) Supplementation: Bariatric Advantage 2x/day and Ca supplements 3x/day  CBG monitoring: ocassionaly  Average CBG per patient: 100-140 Last patient reported A1c: 6.3  Using straws: no Drinking while eating: no Having you been chewing well: yes Chewing/swallowing difficulties: no Changes in vision: some, seeing optometrist who says it is normal   Changes to mood/headaches: no Hair loss/Changes to skin/Changes to nails: no, no, no Any difficulty focusing or concentrating: no Sweating: no Dizziness/Lightheaded: no Palpitations: no  Carbonated beverages: no N/V/D/C/GAS: taking milk of magnesia every 2 days Abdominal Pain: no Dumping syndrome: no  Recent physical activity:  Golfing, walking: 2-3 miles every day on the track and weight machines and 50 crunches  Progress Towards Goal(s):  In progress.  Handouts given during visit include:  Phase IIIB: High Protein + NS vegetables   Nutritional Diagnosis:  NI-5.8.5 Inadeqate fiber intake As related to bariatric surgery post-op diet.  As evidenced by pt report of constipation.    Intervention:  Nutrition education and counseling. Goals: -Aim for 70 fluid ounces every day -Cut your popcicles down to 1-2 a day -Continue to chew very well -Try kefir every morning (4-8 ounces)  -Eat every 3-5 hours   Teaching Method Utilized:  Visual Auditory  Barriers to learning/adherence to lifestyle change: none  Demonstrated degree of understanding via:  Teach Back   Monitoring/Evaluation:  Dietary intake, exercise, lap band fills, and body weight. Follow up in 3 months for 5 month post-op visit.

## 2016-11-13 ENCOUNTER — Other Ambulatory Visit: Payer: Self-pay | Admitting: "Endocrinology

## 2016-11-13 DIAGNOSIS — N183 Chronic kidney disease, stage 3 (moderate): Secondary | ICD-10-CM | POA: Diagnosis not present

## 2016-11-13 DIAGNOSIS — E1165 Type 2 diabetes mellitus with hyperglycemia: Secondary | ICD-10-CM | POA: Diagnosis not present

## 2016-11-13 DIAGNOSIS — Z794 Long term (current) use of insulin: Secondary | ICD-10-CM | POA: Diagnosis not present

## 2016-11-13 DIAGNOSIS — E1122 Type 2 diabetes mellitus with diabetic chronic kidney disease: Secondary | ICD-10-CM | POA: Diagnosis not present

## 2016-11-14 LAB — RENAL FUNCTION PANEL
ALBUMIN: 4.1 g/dL (ref 3.6–4.8)
BUN/Creatinine Ratio: 16 (ref 10–24)
BUN: 18 mg/dL (ref 8–27)
CALCIUM: 9.3 mg/dL (ref 8.6–10.2)
CO2: 26 mmol/L (ref 20–29)
CREATININE: 1.16 mg/dL (ref 0.76–1.27)
Chloride: 103 mmol/L (ref 96–106)
GFR calc Af Amer: 75 mL/min/{1.73_m2} (ref >59)
GFR calc non Af Amer: 65 mL/min/{1.73_m2} (ref >59)
Glucose: 180 mg/dL — ABNORMAL HIGH (ref 65–99)
Phosphorus: 3.4 mg/dL (ref 2.5–4.5)
Potassium: 3.9 mmol/L (ref 3.5–5.2)
Sodium: 145 mmol/L — ABNORMAL HIGH (ref 134–144)

## 2016-11-14 LAB — HGB A1C W/O EAG: HEMOGLOBIN A1C: 6.9 % — AB (ref 4.8–5.6)

## 2016-11-15 DIAGNOSIS — R05 Cough: Secondary | ICD-10-CM | POA: Diagnosis not present

## 2016-11-15 DIAGNOSIS — J4540 Moderate persistent asthma, uncomplicated: Secondary | ICD-10-CM | POA: Diagnosis not present

## 2016-11-19 ENCOUNTER — Ambulatory Visit (INDEPENDENT_AMBULATORY_CARE_PROVIDER_SITE_OTHER): Payer: Medicare Other | Admitting: "Endocrinology

## 2016-11-19 ENCOUNTER — Encounter: Payer: Self-pay | Admitting: "Endocrinology

## 2016-11-19 VITALS — BP 135/88 | HR 86 | Ht 71.0 in | Wt 193.0 lb

## 2016-11-19 DIAGNOSIS — I10 Essential (primary) hypertension: Secondary | ICD-10-CM

## 2016-11-19 DIAGNOSIS — E782 Mixed hyperlipidemia: Secondary | ICD-10-CM | POA: Diagnosis not present

## 2016-11-19 DIAGNOSIS — E1165 Type 2 diabetes mellitus with hyperglycemia: Secondary | ICD-10-CM

## 2016-11-19 MED ORDER — METFORMIN HCL 500 MG PO TABS: 500.0000 mg | ORAL_TABLET | Freq: Two times a day (BID) | ORAL | 0 refills | Status: DC

## 2016-11-19 NOTE — Patient Instructions (Signed)

## 2016-11-19 NOTE — Progress Notes (Signed)
Subjective:    Patient ID: Allen [name], male    DOB: Sep 03, 1950. Patient is being seen in f/u for management of diabetes requested by  Terrial Rhodes, MD  Past Medical History:  Diagnosis Date  . Arthritis    mild  . Asthma   . Cancer (HCC)    hx of skin cancer  . Chronic kidney disease    stage 3  . Diabetes mellitus, type II ([place])    type 2  . GERD (gastroesophageal reflux disease)   . Hyperlipidemia   . Hypertension   . Pneumonia   . Sleep apnea    cpap   Past Surgical History:  Procedure Laterality Date  . carpal tunnel    . CERVICAL VERTEBRAE EXCISION     and cleaning  . CHOLECYSTECTOMY    . GASTRIC ROUX-EN-Y N/A 06/04/2016   Procedure: LAPAROSCOPIC ROUX-EN-Y GASTRIC BYPASS WITH UPPER ENDOSCOPY;  Surgeon: Clovis Riley, MD;  Location: [facility];  Service: General;  Laterality: N/A;  . NASAL SINUS SURGERY    . rouen y     06/04/16  . TRIGGER FINGER RELEASE     Social History   Social History  . Marital status: Married    Spouse name: N/A  . Number of children: N/A  . Years of education: N/A   Social History Main Topics  . Smoking status: Never Smoker  . Smokeless tobacco: Never Used  . Alcohol use No  . Drug use: No  . Sexual activity: Yes   Other Topics Concern  . None   Social History Narrative  . None   Outpatient Encounter Prescriptions as of 11/19/2016  Medication Sig  . metFORMIN (GLUCOPHAGE) 500 MG tablet Take 1 tablet (500 mg total) by mouth 2 (two) times daily with a meal.  . [DISCONTINUED] metFORMIN (GLUCOPHAGE) 500 MG tablet Take 1 tablet (500 mg total) by mouth 2 (two) times daily with a meal.  . amLODipine (NORVASC) 10 MG tablet Take 10 mg by mouth daily.  Marland Kitchen gabapentin (NEURONTIN) 300 MG capsule Take 300 mg by mouth at bedtime as needed (cough).   Marland Kitchen levocetirizine (XYZAL) 5 MG tablet Take 5 mg by mouth every evening.  Marland Kitchen losartan (COZAAR) 100 MG tablet Take 100 mg by mouth daily.  . mometasone (NASONEX) 50 MCG/ACT nasal  spray Place 2 sprays into the nose daily as needed (congestion).   . montelukast (SINGULAIR) 10 MG tablet Take 10 mg by mouth at bedtime.  . Multiple Vitamin (MULTIVITAMIN WITH MINERALS) TABS tablet Take 1 tablet by mouth daily.  . Omega-3 Fatty Acids (FISH OIL) 1000 MG CAPS Take 1,000 mg by mouth 2 (two) times daily.   . [DISCONTINUED] atorvastatin (LIPITOR) 20 MG tablet Take 20 mg by mouth daily at 6 PM.   . [DISCONTINUED] beclomethasone (QVAR) 80 MCG/ACT inhaler Inhale 1 puff into the lungs 2 (two) times daily.  . [DISCONTINUED] eplerenone (INSPRA) 50 MG tablet Take 50 mg by mouth daily.  . [DISCONTINUED] fenofibrate (TRICOR) 145 MG tablet Take 145 mg by mouth every evening.   . [DISCONTINUED] Fluticasone-Umeclidin-Vilant (TRELEGY ELLIPTA) 100-62.5-25 MCG/INH AEPB Inhale 1 puff into the lungs daily.  . [DISCONTINUED] sodium chloride 0.9 % 1,000 mL with thiamine 161 mg, folic acid 1 mg, multivitamins adult 10 mL infusion    No facility-administered encounter medications on file as of 11/19/2016.    ALLERGIES: No Known Allergies VACCINATION STATUS:  There is no immunization history on file for this patient.  Diabetes  He presents for his follow-up diabetic visit. He has type 2 diabetes mellitus. Onset time: He was diagnosed at approximate age of 68 years. His disease course has been improving. There are no hypoglycemic associated symptoms. Pertinent negatives for hypoglycemia include no confusion, headaches, pallor or seizures. Pertinent negatives for diabetes include no chest pain, no fatigue, no polydipsia, no polyphagia, no polyuria and no weakness. There are no hypoglycemic complications. Symptoms are improving. Diabetic complications include nephropathy. Risk factors for coronary artery disease include diabetes mellitus, dyslipidemia, hypertension, male sex, sedentary lifestyle and obesity. Current diabetic treatment includes insulin injections (He is on Lantus 78 units nightly and NovoLog  30-40 units 3 times a day before meals.). His weight is decreasing rapidly (He is status post Roux-en-Y gastric bypass- he has lost 49 pounds overall.). He is following a generally unhealthy and diabetic diet. When asked about meal planning, he reported none. He has not had a previous visit with a dietitian. He participates in exercise intermittently (He participates in golfing activities.). His breakfast blood glucose range is generally 70-90 mg/dl. His lunch blood glucose range is generally 180-200 mg/dl. His dinner blood glucose range is generally 180-200 mg/dl. His bedtime blood glucose range is generally 110-130 mg/dl. His overall blood glucose range is 180-200 mg/dl. An ACE inhibitor/angiotensin II receptor blocker is being taken. Eye exam is current.  Hyperlipidemia  This is a chronic problem. The current episode started more than 1 year ago. The problem is uncontrolled. Exacerbating diseases include chronic renal disease, diabetes and obesity. Pertinent negatives include no chest pain, myalgias or shortness of breath. Current antihyperlipidemic treatment includes statins and bile acid squestrants. Compliance problems include adherence to diet.  Risk factors for coronary artery disease include diabetes mellitus, dyslipidemia, hypertension, male sex, obesity and a sedentary lifestyle.  Hypertension  This is a chronic problem. The current episode started more than 1 year ago. The problem is uncontrolled. Pertinent negatives include no chest pain, headaches, neck pain, palpitations or shortness of breath. Risk factors for coronary artery disease include diabetes mellitus. Past treatments include angiotensin blockers. Hypertensive end-organ damage includes kidney disease. Identifiable causes of hypertension include chronic renal disease.     Review of Systems  Constitutional: Negative for chills, fatigue, fever and unexpected weight change.  HENT: Negative for dental problem, mouth sores and trouble  swallowing.   Eyes: Negative for visual disturbance.  Respiratory: Negative for cough, choking, chest tightness, shortness of breath and wheezing.   Cardiovascular: Negative for chest pain, palpitations and leg swelling.  Gastrointestinal: Negative for abdominal distention, abdominal pain, constipation, diarrhea, nausea and vomiting.  Endocrine: Negative for polydipsia, polyphagia and polyuria.  Genitourinary: Negative for dysuria, flank pain, hematuria and urgency.  Musculoskeletal: Negative for back pain, gait problem, myalgias and neck pain.  Skin: Negative for pallor, rash and wound.  Neurological: Negative for seizures, syncope, weakness, numbness and headaches.  Psychiatric/Behavioral: Negative.  Negative for confusion and dysphoric mood.    Objective:    BP 135/88   Pulse 86   Ht 5\' 11"  (1.803 m)   Wt 193 lb (87.5 kg)   BMI 26.92 kg/m   Wt Readings from Last 3 Encounters:  11/19/16 193 lb (87.5 kg)  10/31/16 192 lb 10.9 oz (87.4 kg)  08/16/16 217 lb (98.4 kg)    Physical Exam  Constitutional: He is oriented to person, place, and time. He appears well-developed and well-nourished. He is cooperative. No distress.  HENT:  Head: Normocephalic and atraumatic.  Eyes: EOM are normal.  Neck:  Normal range of motion. Neck supple. No tracheal deviation present. No thyromegaly present.  Cardiovascular: Normal rate, S1 normal, S2 normal and normal heart sounds.  Exam reveals no gallop.   No murmur heard. Pulses:      Dorsalis pedis pulses are 1+ on the right side, and 1+ on the left side.       Posterior tibial pulses are 1+ on the right side, and 1+ on the left side.  Pulmonary/Chest: Breath sounds normal. No respiratory distress. He has no wheezes.  Abdominal: Soft. Bowel sounds are normal. He exhibits no distension. There is no tenderness. There is no guarding and no CVA tenderness.  Musculoskeletal: He exhibits no edema.       Right shoulder: He exhibits no swelling and no  deformity.  Neurological: He is alert and oriented to person, place, and time. He has normal strength and normal reflexes. No cranial nerve deficit or sensory deficit. Gait normal.  Skin: Skin is warm and dry. No rash noted. No cyanosis. Nails show no clubbing.  Psychiatric: He has a normal mood and affect. His speech is normal and behavior is normal. Judgment and thought content normal. Cognition and memory are normal.    Assessment & Plan:   1. Uncontrolled type 2 diabetes mellitus  - Patient has currently uncontrolled symptomatic type 2 DM since  66 years of age. - His A1c has stayed Stable at 6.9%, generally improved from 10.4%. - He is status post Roux-en-Y gastric bypass, lost 73 pounds since surgery overall.  his diabetes is complicated by mild  CKD  and patient remains at a high risk for more acute and chronic complications of diabetes which include CAD, CVA, CKD, retinopathy, and neuropathy. These are all discussed in detail with the patient.  - I have counseled the patient on diet management and weight loss, by adopting a carbohydrate restricted/protein rich diet.  -  Suggestion is made for him to avoid simple carbohydrates  from his diet including Cakes, Sweet Desserts / Pastries, Ice Cream, Soda (diet and regular), Sweet Tea, Candies, Chips, Cookies, Store Bought Juices, Alcohol in Excess of  1-2 drinks a day, Artificial Sweeteners, and "Sugar-free" Products. This will help patient to have stable blood glucose profile and potentially avoid unintended weight gain.   - I encouraged the patient to switch to  unprocessed or minimally processed complex starch and increased protein intake (animal or plant source), fruits, and vegetables.  - Patient is advised to stick to a routine mealtimes to eat 3 meals  a day and avoid unnecessary snacks ( to snack only to correct hypoglycemia).   - I have approached patient with the following individualized plan to manage diabetes and patient agrees:    -His GFR is  now 65,  proving from 36. He has 3 first renal insufficiency from stage 3 back to  normal.  I advised him to continue low-dose metformin 500 mg by mouth twice a day. -Reportedly, he did not tolerate Trulicity . - The low-dose dexamethasone suppression test to screen for endogenous steroid burden, is negative.  - Patient specific target  A1c;  LDL, HDL, Triglycerides, and  Waist Circumference were discussed in detail.  2) BP/HTN: controlled. Continue current medications including ACEI/ARB. He has a chance to discontinue amlodipine on subsequent visits. 3) Lipids/HPL:  Uncontrolled, triglycerides 267 LDL is 85. His antilipid medications are all discontinued by his PMD, however he would still benefit from low-dose Lipitor.  4)  Weight/Diet: Status post Roux-en-Y gastric bypass, lost  73 pounds overall. CDE Consult in progress , exercise, and detailed carbohydrates information provided.  5) Chronic Care/Health Maintenance:  -Patient is on ACEI/ARB and Statin medications and encouraged to continue to follow up with Ophthalmology, Podiatrist at least yearly or according to recommendations, and advised to   stay away from smoking. I have recommended yearly flu vaccine and pneumonia vaccination at least every 5 years; moderate intensity exercise for up to 150 minutes weekly; and  sleep for at least 7 hours a day.  - I advised patient to maintain close follow up with Terrial Rhodes, MD for primary care needs.  - Time spent with the patient: 25 min, of which >50% was spent in reviewing his sugar logs , discussing his hypo- and hyper-glycemic episodes, reviewing his current and  previous labs and insulin doses and developing a plan to avoid hypo- and hyper-glycemia.   Follow up plan: He'll return in 3 months with repeat CMP and A1c. Glade Lloyd, MD Phone: [phone]  Fax: [phone]  -  This note was partially dictated with voice recognition software. Similar sounding words can be  transcribed inadequately or may not  be corrected upon review.  11/19/2016, 11:51 AM

## 2016-11-21 DIAGNOSIS — I10 Essential (primary) hypertension: Secondary | ICD-10-CM | POA: Diagnosis not present

## 2016-11-21 DIAGNOSIS — E119 Type 2 diabetes mellitus without complications: Secondary | ICD-10-CM | POA: Diagnosis not present

## 2016-11-29 DIAGNOSIS — J4540 Moderate persistent asthma, uncomplicated: Secondary | ICD-10-CM | POA: Diagnosis not present

## 2016-11-29 DIAGNOSIS — R05 Cough: Secondary | ICD-10-CM | POA: Diagnosis not present

## 2016-12-12 DIAGNOSIS — R05 Cough: Secondary | ICD-10-CM | POA: Diagnosis not present

## 2016-12-12 DIAGNOSIS — J4540 Moderate persistent asthma, uncomplicated: Secondary | ICD-10-CM | POA: Diagnosis not present

## 2016-12-20 ENCOUNTER — Ambulatory Visit
Admission: RE | Admit: 2016-12-20 | Discharge: 2016-12-20 | Disposition: A | Discharge status: Home / Self Care | Payer: Medicare Other | Source: Ambulatory Visit | Attending: Surgery | Admitting: Surgery

## 2016-12-20 ENCOUNTER — Other Ambulatory Visit: Payer: Self-pay | Admitting: Surgery

## 2016-12-20 DIAGNOSIS — R1013 Epigastric pain: Secondary | ICD-10-CM | POA: Diagnosis not present

## 2016-12-20 DIAGNOSIS — Z9884 Bariatric surgery status: Secondary | ICD-10-CM | POA: Diagnosis not present

## 2016-12-20 DIAGNOSIS — E559 Vitamin D deficiency, unspecified: Secondary | ICD-10-CM | POA: Diagnosis not present

## 2016-12-20 DIAGNOSIS — K912 Postsurgical malabsorption, not elsewhere classified: Secondary | ICD-10-CM | POA: Diagnosis not present

## 2016-12-20 DIAGNOSIS — D509 Iron deficiency anemia, unspecified: Secondary | ICD-10-CM | POA: Diagnosis not present

## 2016-12-20 DIAGNOSIS — R110 Nausea: Secondary | ICD-10-CM | POA: Diagnosis not present

## 2016-12-20 DIAGNOSIS — K5900 Constipation, unspecified: Secondary | ICD-10-CM | POA: Diagnosis not present

## 2016-12-20 MED ORDER — IOPAMIDOL (ISOVUE-300) INJECTION 61%
100.0000 mL | Freq: Once | INTRAVENOUS | Status: AC | PRN
  Administered 2016-12-20: 100 mL via INTRAVENOUS

## 2016-12-24 DIAGNOSIS — R9389 Abnormal findings on diagnostic imaging of other specified body structures: Secondary | ICD-10-CM | POA: Diagnosis not present

## 2016-12-24 DIAGNOSIS — J0121 Acute recurrent ethmoidal sinusitis: Secondary | ICD-10-CM | POA: Diagnosis not present

## 2016-12-24 DIAGNOSIS — H9211 Otorrhea, right ear: Secondary | ICD-10-CM | POA: Diagnosis not present

## 2016-12-24 DIAGNOSIS — R062 Wheezing: Secondary | ICD-10-CM | POA: Diagnosis not present

## 2016-12-24 DIAGNOSIS — R05 Cough: Secondary | ICD-10-CM | POA: Diagnosis not present

## 2016-12-26 DIAGNOSIS — J0121 Acute recurrent ethmoidal sinusitis: Secondary | ICD-10-CM | POA: Diagnosis not present

## 2016-12-26 DIAGNOSIS — H9211 Otorrhea, right ear: Secondary | ICD-10-CM | POA: Diagnosis not present

## 2016-12-27 DIAGNOSIS — R05 Cough: Secondary | ICD-10-CM | POA: Diagnosis not present

## 2016-12-27 DIAGNOSIS — J0121 Acute recurrent ethmoidal sinusitis: Secondary | ICD-10-CM | POA: Diagnosis not present

## 2016-12-27 DIAGNOSIS — J4540 Moderate persistent asthma, uncomplicated: Secondary | ICD-10-CM | POA: Diagnosis not present

## 2016-12-27 DIAGNOSIS — H9211 Otorrhea, right ear: Secondary | ICD-10-CM | POA: Diagnosis not present

## 2017-01-02 DIAGNOSIS — L570 Actinic keratosis: Secondary | ICD-10-CM | POA: Diagnosis not present

## 2017-01-02 DIAGNOSIS — D0462 Carcinoma in situ of skin of left upper limb, including shoulder: Secondary | ICD-10-CM | POA: Diagnosis not present

## 2017-01-02 DIAGNOSIS — D0439 Carcinoma in situ of skin of other parts of face: Secondary | ICD-10-CM | POA: Diagnosis not present

## 2017-01-02 DIAGNOSIS — L821 Other seborrheic keratosis: Secondary | ICD-10-CM | POA: Diagnosis not present

## 2017-01-02 DIAGNOSIS — D229 Melanocytic nevi, unspecified: Secondary | ICD-10-CM | POA: Diagnosis not present

## 2017-01-03 DIAGNOSIS — J0121 Acute recurrent ethmoidal sinusitis: Secondary | ICD-10-CM | POA: Diagnosis not present

## 2017-01-03 DIAGNOSIS — R1013 Epigastric pain: Secondary | ICD-10-CM | POA: Diagnosis not present

## 2017-01-03 DIAGNOSIS — H9211 Otorrhea, right ear: Secondary | ICD-10-CM | POA: Diagnosis not present

## 2017-01-03 DIAGNOSIS — R9389 Abnormal findings on diagnostic imaging of other specified body structures: Secondary | ICD-10-CM | POA: Diagnosis not present

## 2017-01-07 DIAGNOSIS — M67432 Ganglion, left wrist: Secondary | ICD-10-CM | POA: Diagnosis not present

## 2017-01-09 ENCOUNTER — Ambulatory Visit: Payer: Self-pay | Admitting: Surgery

## 2017-01-10 DIAGNOSIS — R05 Cough: Secondary | ICD-10-CM | POA: Diagnosis not present

## 2017-01-10 DIAGNOSIS — J4540 Moderate persistent asthma, uncomplicated: Secondary | ICD-10-CM | POA: Diagnosis not present

## 2017-01-21 ENCOUNTER — Encounter (HOSPITAL_COMMUNITY): Payer: Self-pay | Admitting: *Deleted

## 2017-01-21 ENCOUNTER — Other Ambulatory Visit: Payer: Self-pay

## 2017-01-24 DIAGNOSIS — J4540 Moderate persistent asthma, uncomplicated: Secondary | ICD-10-CM | POA: Diagnosis not present

## 2017-01-24 DIAGNOSIS — R05 Cough: Secondary | ICD-10-CM | POA: Diagnosis not present

## 2017-01-28 DIAGNOSIS — E119 Type 2 diabetes mellitus without complications: Secondary | ICD-10-CM | POA: Diagnosis not present

## 2017-01-28 DIAGNOSIS — Z7984 Long term (current) use of oral hypoglycemic drugs: Secondary | ICD-10-CM | POA: Diagnosis not present

## 2017-01-28 DIAGNOSIS — M67432 Ganglion, left wrist: Secondary | ICD-10-CM | POA: Diagnosis not present

## 2017-01-28 DIAGNOSIS — K219 Gastro-esophageal reflux disease without esophagitis: Secondary | ICD-10-CM | POA: Diagnosis not present

## 2017-01-28 DIAGNOSIS — F40240 Claustrophobia: Secondary | ICD-10-CM | POA: Diagnosis not present

## 2017-01-28 DIAGNOSIS — E785 Hyperlipidemia, unspecified: Secondary | ICD-10-CM | POA: Diagnosis not present

## 2017-01-28 DIAGNOSIS — I10 Essential (primary) hypertension: Secondary | ICD-10-CM | POA: Diagnosis not present

## 2017-01-28 DIAGNOSIS — Z85828 Personal history of other malignant neoplasm of skin: Secondary | ICD-10-CM | POA: Diagnosis not present

## 2017-01-28 DIAGNOSIS — I4510 Unspecified right bundle-branch block: Secondary | ICD-10-CM | POA: Diagnosis not present

## 2017-01-28 DIAGNOSIS — J45909 Unspecified asthma, uncomplicated: Secondary | ICD-10-CM | POA: Diagnosis not present

## 2017-01-28 HISTORY — PX: OTHER SURGICAL HISTORY: SHX169

## 2017-01-30 NOTE — H&P (Signed)
Allen [name]  Location: [facility] Patient #: [id number] DOB: 1950-06-25 Married / Language: English / Race: White Male  History of Present Illness   The patient is a 67 year old male.  He underwent a RYFB by Dr. Kae Heller on 06/04/2016 for a BMI of 36.07.  Patient words: Doing a little bit better, he is maintaining his weight. He continues to have postprandial epigastric discomfort. This is relieved by stretching out his recliner. He also expresses nausea. He is able to take water down without any problem although he does occasionally get nauseated from this. He's been taking Nexium once a day, his insurance would not pay for the Carafate surgery did not start enough. His constipation has improved in the last 2-3 days a stable abdominal without laxative. CT scan there we got last visit showed constipation but no intra-abdominal pathology. His labs aside from low iron were unremarkable and I had spoken to on the phone and recommended he add an over-the-counter iron supplement.  Past Medical History: 1. Insulin dependent diabetes mellitus, HgA1c 9.3 2. Sleep apnea on CPAP 3. Hypertension 4. Hyperlipidemia 5. GERD 6. Chronic kidney disease  Allergies (April Staton, CMA; 01/03/2017 12:14 PM) No Known Drug Allergies 04/03/2016 Allergies Reconciled   Medication History (April Staton, CMA; 01/03/2017 12:14 PM) Colace (100MG  Capsule, 1 (one) Capsule Oral two times daily, as needed, Taken starting 12/20/2016) Active. Carafate (1GM/10ML Suspension, 10 milliliter Oral three times daily, Taken starting 12/20/2016) Active. (Take one dose with meals and at bedtime.) Ativan (2MG  Tablet, one Tablet Oral one time dose, Taken starting 12/20/2016) Active. NexIUM (40MG  Capsule DR, one Capsule DR Oral twice a day, Taken starting 12/20/2016) Active. Omeprazole (20MG  Capsule DR, Oral) Active. Mometasone Furoate (50MCG/ACT Suspension, Nasal) Active. Levocetirizine Dihydrochloride  (5MG  Tablet, Oral) Active. Multivitamins/Minerals (Oral) Active. Atorvastatin Calcium (20MG  Tablet, Oral) Active. Eplerenone (50MG  Tablet, Oral) Active. Fenofibrate (145MG  Tablet, Oral) Active. Gabapentin (300MG  Capsule, Oral) Active. Montelukast Sodium (10MG  Tablet, Oral) Active. NovoLOG FlexPen (100UNIT/ML Soln Pen-inj, Subcutaneous) Active. Lantus SoloStar (100UNIT/ML Soln Pen-inj, Subcutaneous) Active. AmLODIPine Besylate (10MG  Tablet, Oral) Active. MetFORMIN HCl (500MG  Tablet, Oral) Active. Escitalopram Oxalate (10MG  Tablet, Oral) Active. Medications Reconciled  Vitals (April Staton CMA; 01/03/2017 12:14 PM) 01/03/2017 12:14 PM Weight: 188.31 lb Height: 70in Body Surface Area: 2.03 m Body Mass Index: 27.02 kg/m  Temp.: 98.37F(Oral)  Pulse: 61 (Regular)  BP: 160/88 (Sitting, Left Arm, Standard)   Physical Exam  General: alert and well appearing Integumentary: warm and dry  Chest and Lung Exam Note: Unlabored respirations, clear bilaterally  Abdomen: Soft, nontender nondistended. No mass or organomegaly.  Neurologic: Grossly intact, normal gait  Neuropsychiatric Note: Normal mood and affect. Appropriate insight.  Assessment & Plan: 1.  EPIGASTRIC PAIN (R10.13)  Story: Advised him to take Nexium twice a day. We will refer her for upper endoscopy to evaluate for marginal ulcer. Hopefully this will be negative however if it is positive perhaps that'll help convince his insurance company to pay for the Carafate to help it heal.  In talking to him more it sounds like she is possibly eating too much per meal. He describes eating [place] toast and bacon at a diner, and entire bowl of soup at a time, etc. I encouraged him to try a more frequent smaller meals as he may just be over distending the pouch. We'll follow up after the endoscopy.  2.  History of RYGB - 06/04/2016 - Dr. Kae Heller 3. Insulin dependent diabetes mellitus, HgA1c 9.3 4. Sleep apnea  on  CPAP 5. Hypertension 6. Hyperlipidemia 7. GERD 8. Chronic kidney disease  Alphonsa Overall, MD, [facility] Pager: [phone] Office phone:  [phone]

## 2017-01-31 ENCOUNTER — Ambulatory Visit (HOSPITAL_COMMUNITY)
Admission: RE | Admit: 2017-01-31 | Discharge: 2017-01-31 | Disposition: A | Discharge status: Home / Self Care | Payer: Medicare Other | Source: Ambulatory Visit | Attending: Surgery | Admitting: Surgery

## 2017-01-31 ENCOUNTER — Other Ambulatory Visit: Payer: Self-pay

## 2017-01-31 ENCOUNTER — Encounter (HOSPITAL_COMMUNITY)
Admission: RE | Disposition: A | Discharge status: Home / Self Care | Payer: Self-pay | Source: Ambulatory Visit | Attending: Surgery

## 2017-01-31 ENCOUNTER — Ambulatory Visit (HOSPITAL_COMMUNITY): Payer: Medicare Other | Admitting: Certified Registered Nurse Anesthetist

## 2017-01-31 ENCOUNTER — Encounter (HOSPITAL_COMMUNITY): Payer: Self-pay

## 2017-01-31 DIAGNOSIS — E785 Hyperlipidemia, unspecified: Secondary | ICD-10-CM | POA: Diagnosis not present

## 2017-01-31 DIAGNOSIS — Z9884 Bariatric surgery status: Secondary | ICD-10-CM | POA: Insufficient documentation

## 2017-01-31 DIAGNOSIS — K5900 Constipation, unspecified: Secondary | ICD-10-CM | POA: Insufficient documentation

## 2017-01-31 DIAGNOSIS — R1013 Epigastric pain: Secondary | ICD-10-CM | POA: Insufficient documentation

## 2017-01-31 DIAGNOSIS — N189 Chronic kidney disease, unspecified: Secondary | ICD-10-CM | POA: Diagnosis not present

## 2017-01-31 DIAGNOSIS — G4730 Sleep apnea, unspecified: Secondary | ICD-10-CM | POA: Diagnosis not present

## 2017-01-31 DIAGNOSIS — R110 Nausea: Secondary | ICD-10-CM | POA: Diagnosis not present

## 2017-01-31 DIAGNOSIS — M1990 Unspecified osteoarthritis, unspecified site: Secondary | ICD-10-CM | POA: Insufficient documentation

## 2017-01-31 DIAGNOSIS — Z9989 Dependence on other enabling machines and devices: Secondary | ICD-10-CM | POA: Diagnosis not present

## 2017-01-31 DIAGNOSIS — I129 Hypertensive chronic kidney disease with stage 1 through stage 4 chronic kidney disease, or unspecified chronic kidney disease: Secondary | ICD-10-CM | POA: Insufficient documentation

## 2017-01-31 DIAGNOSIS — E782 Mixed hyperlipidemia: Secondary | ICD-10-CM | POA: Diagnosis not present

## 2017-01-31 DIAGNOSIS — E1122 Type 2 diabetes mellitus with diabetic chronic kidney disease: Secondary | ICD-10-CM | POA: Insufficient documentation

## 2017-01-31 DIAGNOSIS — K219 Gastro-esophageal reflux disease without esophagitis: Secondary | ICD-10-CM | POA: Insufficient documentation

## 2017-01-31 DIAGNOSIS — Z980 Intestinal bypass and anastomosis status: Secondary | ICD-10-CM | POA: Insufficient documentation

## 2017-01-31 DIAGNOSIS — J45909 Unspecified asthma, uncomplicated: Secondary | ICD-10-CM | POA: Diagnosis not present

## 2017-01-31 DIAGNOSIS — Z794 Long term (current) use of insulin: Secondary | ICD-10-CM | POA: Insufficient documentation

## 2017-01-31 DIAGNOSIS — Z79899 Other long term (current) drug therapy: Secondary | ICD-10-CM | POA: Insufficient documentation

## 2017-01-31 DIAGNOSIS — I10 Essential (primary) hypertension: Secondary | ICD-10-CM | POA: Diagnosis not present

## 2017-01-31 DIAGNOSIS — E119 Type 2 diabetes mellitus without complications: Secondary | ICD-10-CM | POA: Diagnosis not present

## 2017-01-31 HISTORY — DX: Claustrophobia: F40.240

## 2017-01-31 HISTORY — DX: Anemia, unspecified: D64.9

## 2017-01-31 HISTORY — PX: ESOPHAGOGASTRODUODENOSCOPY (EGD) WITH PROPOFOL: SHX5813

## 2017-01-31 LAB — GLUCOSE, CAPILLARY: Glucose-Capillary: 181 mg/dL — ABNORMAL HIGH (ref 65–99)

## 2017-01-31 SURGERY — ESOPHAGOGASTRODUODENOSCOPY (EGD) WITH PROPOFOL
Anesthesia: Monitor Anesthesia Care

## 2017-01-31 MED ORDER — PROPOFOL 10 MG/ML IV BOLUS
INTRAVENOUS | Status: AC
  Filled 2017-01-31: qty 60

## 2017-01-31 MED ORDER — SODIUM CHLORIDE 0.9 % IV SOLN: INTRAVENOUS | Status: DC

## 2017-01-31 MED ORDER — ONDANSETRON HCL 4 MG/2ML IJ SOLN
INTRAMUSCULAR | Status: DC | PRN
  Administered 2017-01-31: 4 mg via INTRAVENOUS

## 2017-01-31 MED ORDER — PROPOFOL 500 MG/50ML IV EMUL
INTRAVENOUS | Status: DC | PRN
  Administered 2017-01-31: 125 ug/kg/min via INTRAVENOUS

## 2017-01-31 MED ORDER — LIDOCAINE 2% (20 MG/ML) 5 ML SYRINGE
INTRAMUSCULAR | Status: DC | PRN
  Administered 2017-01-31: 100 mg via INTRAVENOUS

## 2017-01-31 MED ORDER — PROPOFOL 10 MG/ML IV BOLUS
INTRAVENOUS | Status: DC | PRN
  Administered 2017-01-31: 10 mg via INTRAVENOUS
  Administered 2017-01-31 (×4): 20 mg via INTRAVENOUS

## 2017-01-31 MED ORDER — LACTATED RINGERS IV SOLN
INTRAVENOUS | Status: DC
Start: 2017-01-31 — End: 2017-01-31
  Administered 2017-01-31: 10:00:00 via INTRAVENOUS

## 2017-01-31 SURGICAL SUPPLY — 14 items

## 2017-01-31 NOTE — Transfer of Care (Signed)
Immediate Anesthesia Transfer of Care Note  Patient: [place]  Procedure(s) Performed: ESOPHAGOGASTRODUODENOSCOPY (EGD) WITH PROPOFOL (N/A )  Patient Location: Endoscopy Unit  Anesthesia Type:MAC  Level of Consciousness: awake and alert   Airway & Oxygen Therapy: Patient Spontanous Breathing and Patient connected to nasal cannula oxygen  Post-op Assessment: Report given to RN and Post -op Vital signs reviewed and stable  Post vital signs: Reviewed and stable  Last Vitals:  Vitals:   01/31/17 0957  BP: (!) 145/84  Pulse: (!) 59  Resp: 18  Temp: 36.7 C  SpO2: 94%    Last Pain:  Vitals:   01/31/17 0957  TempSrc: Oral         Complications: No apparent anesthesia complications

## 2017-01-31 NOTE — Anesthesia Postprocedure Evaluation (Signed)
Anesthesia Post Note  Patient: [place]  Procedure(s) Performed: ESOPHAGOGASTRODUODENOSCOPY (EGD) WITH PROPOFOL (N/A )     Patient location during evaluation: PACU Anesthesia Type: MAC Level of consciousness: awake and alert Pain management: pain level controlled Vital Signs Assessment: post-procedure vital signs reviewed and stable Respiratory status: spontaneous breathing, nonlabored ventilation, respiratory function stable and patient connected to nasal cannula oxygen Cardiovascular status: stable and blood pressure returned to baseline Postop Assessment: no apparent nausea or vomiting Anesthetic complications: no    Last Vitals:  Vitals:   01/31/17 1330 01/31/17 1335  BP: 137/75 139/80  Pulse: 64 (!) 59  Resp: 15 20  Temp:    SpO2: 94% 93%    Last Pain:  Vitals:   01/31/17 1317  TempSrc: Oral                 Taria Castrillo S

## 2017-01-31 NOTE — Op Note (Signed)
01/31/2017  1:12 PM  PATIENT:  Allen [name], 67 y.o., male, MRN: [id number]  PREOP DIAGNOSIS:  History of gastric bypass, epigastric pain and and nausea   POSTOP DIAGNOSIS:   History of gastric bypass, normal anatomy  PROCEDURE:  Esophagogastrojejunoscopy [Photos were taken and placed in Epic]  SURGEON:   Alphonsa Overall, M.D.  ANESTHESIA:   Propofol by Anesthetist  INDICATIONS FOR PROCEDURE:  Allen [name] is a 66 y.o. (DOB: [birth year])  white male whose primary care physician is Vasireddy, Lanetta Inch, MD and comes for upper endoscopy to evaluate his gastric bypass.  The patient had a RYGB on 06/04/2016 by Dr. Charna Busman.   He has had both trouble keeping food down and some pain with eating.  I am endoscoping him to evaluate his anatomy.   The indications and risks of the endoscopy were explained to the patient.  The risks include, but are not limited to, perforation, bleeding, or injury to the bowel.  If balloon dilatation is needed, the risk of perforation is higher.  PROCEDURE:  The patient was in room 4 at [facility] endoscopy unit.  The patient was monitored with a pulse oximetry, BP cuff, and EKG.  The patient has nasal O2 flowing during the procedure.   A time was held prior to the procedure.   Anesthesia was provided by Adair Laundry, CRNA.  Findings include:   Esophagus:   Normal   GE junction at:  40 cm   Stomach pouch: Normal.   Gastrojejunal anastomosis:   46 cm.  Anastomosis is widely patent.  No evidence of ulcers.   Efferent jejunal limb:  I went down 40 cm, no mucosal lesion or obstruction.   Afferent jejunal limb:  15 cm, normal.   CLO test:  Done  PLAN:   Photos taken and given to patient.   Continue Nexium. To contact Dr. Kae Heller for follow up.  Alphonsa Overall, MD, [facility] Pager: [phone] Office phone:  [phone]

## 2017-01-31 NOTE — Discharge Instructions (Signed)
[facility] [place] [facility] - DISCHARGE INSTRUCTIONS TO PATIENT  Activity:  Driving - May drive tomorrow  Diet:  As tolerated  Follow up appointment:  Call Dr. Ron Parker office [facility]) at [phone] for an appointment in 6 weeks.  Medications and dosages:  Resume your home medications.  Call Dr. Kae Heller or her office  [phone]) if you have:  Persistent nausea and vomiting,  Severe uncontrolled pain,  Any other questions or concerns you may have after discharge.  In an emergency, call 911 or go to an Emergency Department at a nearby hospital.

## 2017-01-31 NOTE — Anesthesia Preprocedure Evaluation (Signed)
Anesthesia Evaluation  Patient identified by MRN, date of birth, ID band Patient awake    Reviewed: Allergy & Precautions, H&P , NPO status , Patient's Chart, lab work & pertinent test results  Airway Mallampati: II   Neck ROM: full    Dental   Pulmonary asthma , sleep apnea ,    breath sounds clear to auscultation       Cardiovascular hypertension,  Rhythm:regular Rate:Normal     Neuro/Psych Anxiety    GI/Hepatic GERD  ,  Endo/Other  diabetes, Type 2  Renal/GU      Musculoskeletal  (+) Arthritis ,   Abdominal   Peds  Hematology   Anesthesia Other Findings   Reproductive/Obstetrics                             Anesthesia Physical Anesthesia Plan  ASA: II  Anesthesia Plan: MAC   Post-op Pain Management:    Induction: Intravenous  PONV Risk Score and Plan: 1  Airway Management Planned: Nasal Cannula  Additional Equipment:   Intra-op Plan:   Post-operative Plan:   Informed Consent: I have reviewed the patients History and Physical, chart, labs and discussed the procedure including the risks, benefits and alternatives for the proposed anesthesia with the patient or authorized representative who has indicated his/her understanding and acceptance.     Plan Discussed with: CRNA, Anesthesiologist and Surgeon  Anesthesia Plan Comments:         Anesthesia Quick Evaluation

## 2017-01-31 NOTE — Interval H&P Note (Signed)
History and Physical Interval Note:  01/31/2017 12:47 PM  [place]  has presented today for surgery, with the diagnosis of History of gastric bypass, epigastric pain  The various methods of treatment have been discussed with the patient and family.   His wife is with him.  He had a left ganglion cyst removed on Monday, 01/28/2017, by Dr. Francee Nodal in [place].  After consideration of risks, benefits and other options for treatment, the patient has consented to  Procedure(s): ESOPHAGOGASTRODUODENOSCOPY (EGD) WITH PROPOFOL (N/A) as a surgical intervention .  The patient's history has been reviewed, patient examined, no change in status, stable for surgery.  I have reviewed the patient's chart and labs.  Questions were answered to the patient's satisfaction.     Shann Medal

## 2017-02-01 ENCOUNTER — Encounter (HOSPITAL_COMMUNITY): Payer: Self-pay | Admitting: Surgery

## 2017-02-01 LAB — CLOTEST (H. PYLORI), BIOPSY: Helicobacter screen: NEGATIVE

## 2017-02-04 DIAGNOSIS — K219 Gastro-esophageal reflux disease without esophagitis: Secondary | ICD-10-CM | POA: Diagnosis not present

## 2017-02-04 DIAGNOSIS — E119 Type 2 diabetes mellitus without complications: Secondary | ICD-10-CM | POA: Diagnosis not present

## 2017-02-04 DIAGNOSIS — E785 Hyperlipidemia, unspecified: Secondary | ICD-10-CM | POA: Diagnosis not present

## 2017-02-04 DIAGNOSIS — I10 Essential (primary) hypertension: Secondary | ICD-10-CM | POA: Diagnosis not present

## 2017-02-07 DIAGNOSIS — D485 Neoplasm of uncertain behavior of skin: Secondary | ICD-10-CM | POA: Diagnosis not present

## 2017-02-07 DIAGNOSIS — D0462 Carcinoma in situ of skin of left upper limb, including shoulder: Secondary | ICD-10-CM | POA: Diagnosis not present

## 2017-02-07 DIAGNOSIS — R05 Cough: Secondary | ICD-10-CM | POA: Diagnosis not present

## 2017-02-07 DIAGNOSIS — L570 Actinic keratosis: Secondary | ICD-10-CM | POA: Diagnosis not present

## 2017-02-07 DIAGNOSIS — J4540 Moderate persistent asthma, uncomplicated: Secondary | ICD-10-CM | POA: Diagnosis not present

## 2017-02-07 DIAGNOSIS — D0439 Carcinoma in situ of skin of other parts of face: Secondary | ICD-10-CM | POA: Diagnosis not present

## 2017-02-07 DIAGNOSIS — R110 Nausea: Secondary | ICD-10-CM | POA: Diagnosis not present

## 2017-02-20 DIAGNOSIS — R05 Cough: Secondary | ICD-10-CM | POA: Diagnosis not present

## 2017-02-20 DIAGNOSIS — J4540 Moderate persistent asthma, uncomplicated: Secondary | ICD-10-CM | POA: Diagnosis not present

## 2017-03-06 DIAGNOSIS — J4540 Moderate persistent asthma, uncomplicated: Secondary | ICD-10-CM | POA: Diagnosis not present

## 2017-03-06 DIAGNOSIS — J0120 Acute ethmoidal sinusitis, unspecified: Secondary | ICD-10-CM | POA: Diagnosis not present

## 2017-03-06 DIAGNOSIS — H6592 Unspecified nonsuppurative otitis media, left ear: Secondary | ICD-10-CM | POA: Diagnosis not present

## 2017-03-06 DIAGNOSIS — R05 Cough: Secondary | ICD-10-CM | POA: Diagnosis not present

## 2017-03-14 DIAGNOSIS — R110 Nausea: Secondary | ICD-10-CM | POA: Diagnosis not present

## 2017-03-14 DIAGNOSIS — K912 Postsurgical malabsorption, not elsewhere classified: Secondary | ICD-10-CM | POA: Diagnosis not present

## 2017-03-14 DIAGNOSIS — K5900 Constipation, unspecified: Secondary | ICD-10-CM | POA: Diagnosis not present

## 2017-03-21 DIAGNOSIS — J4540 Moderate persistent asthma, uncomplicated: Secondary | ICD-10-CM | POA: Diagnosis not present

## 2017-03-21 DIAGNOSIS — R05 Cough: Secondary | ICD-10-CM | POA: Diagnosis not present

## 2017-03-30 ENCOUNTER — Other Ambulatory Visit: Payer: Self-pay | Admitting: "Endocrinology

## 2017-04-04 DIAGNOSIS — R05 Cough: Secondary | ICD-10-CM | POA: Diagnosis not present

## 2017-04-04 DIAGNOSIS — J4540 Moderate persistent asthma, uncomplicated: Secondary | ICD-10-CM | POA: Diagnosis not present

## 2017-04-18 DIAGNOSIS — R05 Cough: Secondary | ICD-10-CM | POA: Diagnosis not present

## 2017-04-18 DIAGNOSIS — J4540 Moderate persistent asthma, uncomplicated: Secondary | ICD-10-CM | POA: Diagnosis not present

## 2017-05-01 DIAGNOSIS — Z9884 Bariatric surgery status: Secondary | ICD-10-CM | POA: Diagnosis not present

## 2017-05-01 DIAGNOSIS — J301 Allergic rhinitis due to pollen: Secondary | ICD-10-CM | POA: Diagnosis not present

## 2017-05-01 DIAGNOSIS — G4733 Obstructive sleep apnea (adult) (pediatric): Secondary | ICD-10-CM | POA: Diagnosis not present

## 2017-05-01 DIAGNOSIS — J411 Mucopurulent chronic bronchitis: Secondary | ICD-10-CM | POA: Diagnosis not present

## 2017-05-02 DIAGNOSIS — J4540 Moderate persistent asthma, uncomplicated: Secondary | ICD-10-CM | POA: Diagnosis not present

## 2017-05-02 DIAGNOSIS — R05 Cough: Secondary | ICD-10-CM | POA: Diagnosis not present

## 2017-05-07 DIAGNOSIS — H40023 Open angle with borderline findings, high risk, bilateral: Secondary | ICD-10-CM | POA: Diagnosis not present

## 2017-05-07 LAB — HM DIABETES EYE EXAM

## 2017-05-14 DIAGNOSIS — I10 Essential (primary) hypertension: Secondary | ICD-10-CM | POA: Diagnosis not present

## 2017-05-14 DIAGNOSIS — E785 Hyperlipidemia, unspecified: Secondary | ICD-10-CM | POA: Diagnosis not present

## 2017-05-14 DIAGNOSIS — E119 Type 2 diabetes mellitus without complications: Secondary | ICD-10-CM | POA: Diagnosis not present

## 2017-05-14 DIAGNOSIS — R5383 Other fatigue: Secondary | ICD-10-CM | POA: Diagnosis not present

## 2017-05-14 DIAGNOSIS — Z0000 Encounter for general adult medical examination without abnormal findings: Secondary | ICD-10-CM | POA: Diagnosis not present

## 2017-05-15 DIAGNOSIS — E1165 Type 2 diabetes mellitus with hyperglycemia: Secondary | ICD-10-CM | POA: Diagnosis not present

## 2017-05-15 DIAGNOSIS — R6889 Other general symptoms and signs: Secondary | ICD-10-CM | POA: Diagnosis not present

## 2017-05-15 DIAGNOSIS — E669 Obesity, unspecified: Secondary | ICD-10-CM | POA: Diagnosis not present

## 2017-05-15 DIAGNOSIS — K219 Gastro-esophageal reflux disease without esophagitis: Secondary | ICD-10-CM | POA: Diagnosis not present

## 2017-05-15 DIAGNOSIS — I10 Essential (primary) hypertension: Secondary | ICD-10-CM | POA: Diagnosis not present

## 2017-05-15 DIAGNOSIS — E559 Vitamin D deficiency, unspecified: Secondary | ICD-10-CM | POA: Diagnosis not present

## 2017-05-15 DIAGNOSIS — R05 Cough: Secondary | ICD-10-CM | POA: Diagnosis not present

## 2017-05-15 DIAGNOSIS — J309 Allergic rhinitis, unspecified: Secondary | ICD-10-CM | POA: Diagnosis not present

## 2017-05-15 DIAGNOSIS — E785 Hyperlipidemia, unspecified: Secondary | ICD-10-CM | POA: Diagnosis not present

## 2017-05-15 DIAGNOSIS — F419 Anxiety disorder, unspecified: Secondary | ICD-10-CM | POA: Diagnosis not present

## 2017-05-15 LAB — LIPID PANEL
Cholesterol: 161 (ref 0–200)
HDL: 47 (ref 35–70)
LDL CALC: 96
Triglycerides: 89 (ref 40–160)

## 2017-05-15 LAB — VITAMIN D 25 HYDROXY (VIT D DEFICIENCY, FRACTURES): VIT D 25 HYDROXY: 41

## 2017-05-15 LAB — BASIC METABOLIC PANEL
BUN: 22 — AB (ref 4–21)
Creatinine: 1.2 (ref <=1.3)

## 2017-05-15 LAB — HEMOGLOBIN A1C: Hemoglobin A1C: 7.4

## 2017-05-16 DIAGNOSIS — J4540 Moderate persistent asthma, uncomplicated: Secondary | ICD-10-CM | POA: Diagnosis not present

## 2017-05-16 DIAGNOSIS — R05 Cough: Secondary | ICD-10-CM | POA: Diagnosis not present

## 2017-05-20 ENCOUNTER — Ambulatory Visit (INDEPENDENT_AMBULATORY_CARE_PROVIDER_SITE_OTHER): Payer: Medicare Other | Admitting: "Endocrinology

## 2017-05-20 ENCOUNTER — Encounter: Payer: Self-pay | Admitting: "Endocrinology

## 2017-05-20 VITALS — BP 141/85 | HR 60 | Ht 71.0 in | Wt 189.0 lb

## 2017-05-20 DIAGNOSIS — E1165 Type 2 diabetes mellitus with hyperglycemia: Secondary | ICD-10-CM

## 2017-05-20 DIAGNOSIS — I10 Essential (primary) hypertension: Secondary | ICD-10-CM | POA: Diagnosis not present

## 2017-05-20 DIAGNOSIS — E782 Mixed hyperlipidemia: Secondary | ICD-10-CM | POA: Diagnosis not present

## 2017-05-20 MED ORDER — METFORMIN HCL 1000 MG PO TABS: 1000.0000 mg | ORAL_TABLET | Freq: Two times a day (BID) | ORAL | 6 refills | Status: DC

## 2017-05-20 NOTE — Progress Notes (Signed)
Subjective:    Patient ID: Allen [name], male    DOB: 03/15/1950. Patient is being seen in f/u for management of diabetes requested by  Vasireddy, Lanetta Inch, MD  Past Medical History:  Diagnosis Date  . Anemia    on iron since gastric bypass  . Arthritis    mild  . Asthma   . Cancer (HCC)    hx of skin cancer  . Chronic kidney disease    stage 3, now resolved since weight loss  . Claustrophobia   . Diabetes mellitus, type II ([place])    type 2 metformin  . GERD (gastroesophageal reflux disease)    takes nexium  . Hyperlipidemia   . Hypertension   . Pneumonia 15 yrs ago  . Sleep apnea    cpap, no cpap since may 2018 weight loss 80 labs    Past Surgical History:  Procedure Laterality Date  . carpal tunnel Bilateral   . CERVICAL VERTEBRAE EXCISION     and cleaning  . CHOLECYSTECTOMY    . cyst removed left hand  01-28-17  . ESOPHAGOGASTRODUODENOSCOPY (EGD) WITH PROPOFOL N/A 01/31/2017   Procedure: ESOPHAGOGASTRODUODENOSCOPY (EGD) WITH PROPOFOL;  Surgeon: Alphonsa Overall, MD;  Location: [facility];  Service: General;  Laterality: N/A;  . GASTRIC ROUX-EN-Y N/A 06/04/2016   Procedure: LAPAROSCOPIC ROUX-EN-Y GASTRIC BYPASS WITH UPPER ENDOSCOPY;  Surgeon: Clovis Riley, MD;  Location: [facility];  Service: General;  Laterality: N/A;  . NASAL SINUS SURGERY    . TRIGGER FINGER RELEASE     Social History   Socioeconomic History  . Marital status: Married    Spouse name: Not on file  . Number of children: Not on file  . Years of education: Not on file  . Highest education level: Not on file  Occupational History  . Not on file  Social Needs  . Financial resource strain: Not on file  . Food insecurity:    Worry: Not on file    Inability: Not on file  . Transportation needs:    Medical: Not on file    Non-medical: Not on file  Tobacco Use  . Smoking status: Never Smoker  . Smokeless tobacco: Never Used  Substance and Sexual Activity  . Alcohol use: No  . Drug  use: No  . Sexual activity: Yes  Lifestyle  . Physical activity:    Days per week: Not on file    Minutes per session: Not on file  . Stress: Not on file  Relationships  . Social connections:    Talks on phone: Not on file    Gets together: Not on file    Attends religious service: Not on file    Active member of club or organization: Not on file    Attends meetings of clubs or organizations: Not on file    Relationship status: Not on file  Other Topics Concern  . Not on file  Social History Narrative  . Not on file   Outpatient Encounter Medications as of 05/20/2017  Medication Sig  . atorvastatin (LIPITOR) 20 MG tablet Take 20 mg by mouth daily.  Marland Kitchen olmesartan-hydrochlorothiazide (BENICAR HCT) 40-12.5 MG tablet Take 1 tablet by mouth daily.  Marland Kitchen acetaminophen (TYLENOL) 500 MG tablet Take 1,000 mg by mouth every 8 (eight) hours as needed for mild pain.  Marland Kitchen amLODipine (NORVASC) 10 MG tablet Take 10 mg by mouth every evening.   . Calcium Citrate-Vitamin D (CALCIUM + D PO) Take 1 tablet by mouth  3 (three) times daily.  Marland Kitchen escitalopram (LEXAPRO) 10 MG tablet Take 10 mg by mouth daily as needed for anxiety.  Marland Kitchen esomeprazole (NEXIUM) 20 MG capsule Take 20 mg by mouth 2 (two) times daily before a meal.  . ferrous sulfate 325 (65 FE) MG tablet Take 325 mg by mouth daily with breakfast.  . Fluticasone-Umeclidin-Vilant (TRELEGY ELLIPTA) 100-62.5-25 MCG/INH AEPB Inhale 1 puff into the lungs daily.  Marland Kitchen gabapentin (NEURONTIN) 300 MG capsule Take 300 mg by mouth at bedtime as needed (cough).   Marland Kitchen levocetirizine (XYZAL) 5 MG tablet Take 5 mg by mouth every evening.  . metFORMIN (GLUCOPHAGE) 1000 MG tablet Take 1 tablet (1,000 mg total) by mouth 2 (two) times daily with a meal.  . mometasone (NASONEX) 50 MCG/ACT nasal spray Place 2 sprays into the nose daily as needed (congestion).   . montelukast (SINGULAIR) 10 MG tablet Take 10 mg by mouth at bedtime.  . Multiple Vitamin (MULTIVITAMIN WITH MINERALS)  TABS tablet Take 1 tablet by mouth daily.  . Omega-3 Fatty Acids (FISH OIL) 1000 MG CAPS Take 1,000 mg by mouth 2 (two) times daily.   . [DISCONTINUED] losartan (COZAAR) 100 MG tablet Take 100 mg by mouth daily.  . [DISCONTINUED] metFORMIN (GLUCOPHAGE) 500 MG tablet TAKE 1 TABLET BY MOUTH TWICE A DAY WITH A MEAL   No facility-administered encounter medications on file as of 05/20/2017.    ALLERGIES: No Known Allergies VACCINATION STATUS:  There is no immunization history on file for this patient.  Diabetes  He presents for his follow-up diabetic visit. He has type 2 diabetes mellitus. Onset time: He was diagnosed at approximate age of 46 years. His disease course has been worsening. There are no hypoglycemic associated symptoms. Pertinent negatives for hypoglycemia include no confusion, headaches, pallor or seizures. Associated symptoms include fatigue. Pertinent negatives for diabetes include no chest pain, no polydipsia, no polyphagia, no polyuria and no weakness. There are no hypoglycemic complications. Symptoms are improving. Diabetic complications include nephropathy. Risk factors for coronary artery disease include diabetes mellitus, dyslipidemia, hypertension, male sex, sedentary lifestyle and obesity. Current diabetic treatment includes insulin injections (He is on Lantus 78 units nightly and NovoLog 30-40 units 3 times a day before meals.). His weight is decreasing rapidly (He is status post Roux-en-Y gastric bypass- he has lost 77pounds overall.). He is following a generally unhealthy and diabetic diet. When asked about meal planning, he reported none. He has not had a previous visit with a dietitian. He participates in exercise intermittently (He participates in golfing activities.). An ACE inhibitor/angiotensin II receptor blocker is being taken. Eye exam is current.  Hyperlipidemia  This is a chronic problem. The current episode started more than 1 year ago. The problem is uncontrolled.  Exacerbating diseases include chronic renal disease and diabetes. He has no history of obesity. Pertinent negatives include no chest pain, myalgias or shortness of breath. Current antihyperlipidemic treatment includes statins and bile acid squestrants. Compliance problems include adherence to diet.  Risk factors for coronary artery disease include diabetes mellitus, dyslipidemia, hypertension, male sex, obesity and a sedentary lifestyle.  Hypertension  This is a chronic problem. The current episode started more than 1 year ago. The problem is uncontrolled. Pertinent negatives include no chest pain, headaches, neck pain, palpitations or shortness of breath. Risk factors for coronary artery disease include diabetes mellitus, dyslipidemia and sedentary lifestyle. Past treatments include angiotensin blockers. Hypertensive end-organ damage includes kidney disease. Identifiable causes of hypertension include chronic renal disease.  Review of Systems  Constitutional: Positive for fatigue. Negative for chills, fever and unexpected weight change.  HENT: Negative for dental problem, mouth sores and trouble swallowing.   Eyes: Negative for visual disturbance.  Respiratory: Negative for cough, choking, chest tightness, shortness of breath and wheezing.   Cardiovascular: Negative for chest pain, palpitations and leg swelling.  Gastrointestinal: Negative for abdominal distention, abdominal pain, constipation, diarrhea, nausea and vomiting.  Endocrine: Negative for polydipsia, polyphagia and polyuria.  Genitourinary: Negative for dysuria, flank pain, hematuria and urgency.  Musculoskeletal: Negative for back pain, gait problem, myalgias and neck pain.  Skin: Negative for pallor, rash and wound.  Neurological: Negative for seizures, syncope, weakness, numbness and headaches.  Psychiatric/Behavioral: Negative for confusion and dysphoric mood.    Objective:    BP (!) 141/85   Pulse 60   Ht 5\' 11"  (1.803 m)    Wt 189 lb (85.7 kg)   BMI 26.36 kg/m   Wt Readings from Last 3 Encounters:  05/20/17 189 lb (85.7 kg)  01/31/17 180 lb (81.6 kg)  11/19/16 193 lb (87.5 kg)    Physical Exam  Constitutional: He is oriented to person, place, and time. He appears well-developed and well-nourished. He is cooperative. No distress.  HENT:  Head: Normocephalic and atraumatic.  Eyes: EOM are normal.  Neck: Normal range of motion. Neck supple. No tracheal deviation present. No thyromegaly present.  Cardiovascular: Normal rate, S1 normal and S2 normal. Exam reveals no gallop.  No murmur heard. Pulses:      Dorsalis pedis pulses are 1+ on the right side, and 1+ on the left side.       Posterior tibial pulses are 1+ on the right side, and 1+ on the left side.  Pulmonary/Chest: Effort normal. No respiratory distress. He has no wheezes.  Abdominal: He exhibits no distension. There is no tenderness. There is no guarding and no CVA tenderness.  Musculoskeletal: He exhibits no edema.       Right shoulder: He exhibits no swelling and no deformity.  Neurological: He is alert and oriented to person, place, and time. He has normal strength and normal reflexes. No cranial nerve deficit or sensory deficit. Gait normal.  Skin: Skin is warm and dry. No rash noted. No cyanosis. Nails show no clubbing.  Psychiatric: He has a normal mood and affect. His speech is normal and behavior is normal. Judgment and thought content normal. Cognition and memory are normal.    Assessment & Plan:   1. Uncontrolled type 2 diabetes mellitus  - Patient has currently uncontrolled symptomatic type 2 DM since  67 years of age. -Status post Roux-en-Y gastric bypass, after overall significant improvement in his type 2 diabetes A1c is 7.4% increasing from 6.9% during his last visit.    - He  lost 77 pounds since surgery overall.  his diabetes is complicated by mild  CKD  and patient remains at a high risk for more acute and chronic  complications of diabetes which include CAD, CVA, CKD, retinopathy, and neuropathy. These are all discussed in detail with the patient.  - I have counseled the patient on diet management and weight loss, by adopting a carbohydrate restricted/protein rich diet.  -  Suggestion is made for him to avoid simple carbohydrates  from his diet including Cakes, Sweet Desserts / Pastries, Ice Cream, Soda (diet and regular), Sweet Tea, Candies, Chips, Cookies, Store Bought Juices, Alcohol in Excess of  1-2 drinks a day, Artificial Sweeteners, and "Sugar-free" Products. This will  help patient to have stable blood glucose profile and potentially avoid unintended weight gain.  - I encouraged the patient to switch to  unprocessed or minimally processed complex starch and increased protein intake (animal or plant source), fruits, and vegetables.  - Patient is advised to stick to a routine mealtimes to eat 3 meals  a day and avoid unnecessary snacks ( to snack only to correct hypoglycemia).   - I have approached patient with the following individualized plan to manage diabetes and patient agrees:   -His previsit labs show normal renal function, improving from stage III renal insufficiency prior to his surgery. -I have advised him to increase his metformin to 1000 mg p.o. twice daily along with proper hydration. -Reportedly, he did not tolerate Trulicity on prior attempt, potentially making him a suitable candidate for incretin therapy. - The low-dose dexamethasone suppression test to screen for endogenous steroid burden, is negative.  - Patient specific target  A1c;  LDL, HDL, Triglycerides, and  Waist Circumference were discussed in detail.  2) BP/HTN: His blood pressure is not controlled to target controlled.  He is advised to continue on olmesartan/hydrochlorothiazide . 3) Lipids/HPL:  Uncontrolled, liver improving.  His triglycerides are 89 improving from 267, LDL at 96 still above target.  He is advised to  continue atorvastatin 20 mill grams p.o. nightly.    4)  Weight/Diet: Status post Roux-en-Y gastric bypass, lost 77 pounds overall. CDE Consult in progress , exercise, and detailed carbohydrates information provided.  5) Chronic Care/Health Maintenance:  -Patient is on ACEI/ARB and Statin medications and encouraged to continue to follow up with Ophthalmology, Podiatrist at least yearly or according to recommendations, and advised to   stay away from smoking. I have recommended yearly flu vaccine and pneumonia vaccination at least every 5 years; moderate intensity exercise for up to 150 minutes weekly; and  sleep for at least 7 hours a day.  -Given his unexplained fatigue, he may need measurement of his total testosterone on fasting in the a.m. hours.  He plans to get this during his next visit with his PMD.  - I advised patient to maintain close follow up with Vasireddy, Lanetta Inch, MD for primary care needs.  - Time spent with the patient: 25 min, of which >50% was spent in reviewing his  current and  previous labs, previous treatments, and medications doses and developing a plan for long-term care.  [place] participated in the discussions, expressed understanding, and voiced agreement with the above plans.  All questions were answered to his satisfaction. he is encouraged to contact clinic should he have any questions or concerns prior to his return visit.  Follow up plan: He'll return in 3 months with repeat CMP and A1c. Glade Lloyd, MD Phone: [phone]  Fax: [phone]  -  This note was partially dictated with voice recognition software. Similar sounding words can be transcribed inadequately or may not  be corrected upon review.  05/20/2017, 2:26 PM

## 2017-05-20 NOTE — Patient Instructions (Signed)

## 2017-05-23 ENCOUNTER — Encounter: Payer: Self-pay | Admitting: "Endocrinology

## 2017-05-23 DIAGNOSIS — K912 Postsurgical malabsorption, not elsewhere classified: Secondary | ICD-10-CM | POA: Diagnosis not present

## 2017-05-24 ENCOUNTER — Encounter: Payer: Self-pay | Admitting: "Endocrinology

## 2017-05-24 DIAGNOSIS — R05 Cough: Secondary | ICD-10-CM | POA: Diagnosis not present

## 2017-05-24 DIAGNOSIS — E785 Hyperlipidemia, unspecified: Secondary | ICD-10-CM | POA: Diagnosis not present

## 2017-05-24 DIAGNOSIS — J309 Allergic rhinitis, unspecified: Secondary | ICD-10-CM | POA: Diagnosis not present

## 2017-05-24 DIAGNOSIS — F419 Anxiety disorder, unspecified: Secondary | ICD-10-CM | POA: Diagnosis not present

## 2017-05-24 DIAGNOSIS — E669 Obesity, unspecified: Secondary | ICD-10-CM | POA: Diagnosis not present

## 2017-05-24 DIAGNOSIS — I10 Essential (primary) hypertension: Secondary | ICD-10-CM | POA: Diagnosis not present

## 2017-05-24 DIAGNOSIS — E119 Type 2 diabetes mellitus without complications: Secondary | ICD-10-CM | POA: Diagnosis not present

## 2017-05-24 DIAGNOSIS — K219 Gastro-esophageal reflux disease without esophagitis: Secondary | ICD-10-CM | POA: Diagnosis not present

## 2017-05-30 DIAGNOSIS — Z9884 Bariatric surgery status: Secondary | ICD-10-CM | POA: Diagnosis not present

## 2017-05-30 DIAGNOSIS — R110 Nausea: Secondary | ICD-10-CM | POA: Diagnosis not present

## 2017-05-30 DIAGNOSIS — I10 Essential (primary) hypertension: Secondary | ICD-10-CM | POA: Diagnosis not present

## 2017-05-30 DIAGNOSIS — K912 Postsurgical malabsorption, not elsewhere classified: Secondary | ICD-10-CM | POA: Diagnosis not present

## 2017-05-30 DIAGNOSIS — J4540 Moderate persistent asthma, uncomplicated: Secondary | ICD-10-CM | POA: Diagnosis not present

## 2017-05-30 DIAGNOSIS — R05 Cough: Secondary | ICD-10-CM | POA: Diagnosis not present

## 2017-06-07 DIAGNOSIS — J0121 Acute recurrent ethmoidal sinusitis: Secondary | ICD-10-CM | POA: Diagnosis not present

## 2017-06-07 DIAGNOSIS — H9211 Otorrhea, right ear: Secondary | ICD-10-CM | POA: Diagnosis not present

## 2017-06-10 DIAGNOSIS — L570 Actinic keratosis: Secondary | ICD-10-CM | POA: Diagnosis not present

## 2017-06-10 DIAGNOSIS — D485 Neoplasm of uncertain behavior of skin: Secondary | ICD-10-CM | POA: Diagnosis not present

## 2017-06-12 DIAGNOSIS — J4540 Moderate persistent asthma, uncomplicated: Secondary | ICD-10-CM | POA: Diagnosis not present

## 2017-06-12 DIAGNOSIS — R05 Cough: Secondary | ICD-10-CM | POA: Diagnosis not present

## 2017-06-18 DIAGNOSIS — I10 Essential (primary) hypertension: Secondary | ICD-10-CM | POA: Diagnosis not present

## 2017-06-27 DIAGNOSIS — J4540 Moderate persistent asthma, uncomplicated: Secondary | ICD-10-CM | POA: Diagnosis not present

## 2017-06-27 DIAGNOSIS — R05 Cough: Secondary | ICD-10-CM | POA: Diagnosis not present

## 2017-06-29 ENCOUNTER — Other Ambulatory Visit: Payer: Self-pay | Admitting: "Endocrinology

## 2017-07-03 DIAGNOSIS — I444 Left anterior fascicular block: Secondary | ICD-10-CM | POA: Diagnosis not present

## 2017-07-03 DIAGNOSIS — I10 Essential (primary) hypertension: Secondary | ICD-10-CM | POA: Diagnosis not present

## 2017-07-03 DIAGNOSIS — I440 Atrioventricular block, first degree: Secondary | ICD-10-CM | POA: Diagnosis not present

## 2017-07-03 DIAGNOSIS — R5383 Other fatigue: Secondary | ICD-10-CM | POA: Diagnosis not present

## 2017-07-03 DIAGNOSIS — R9431 Abnormal electrocardiogram [ECG] [EKG]: Secondary | ICD-10-CM | POA: Diagnosis not present

## 2017-07-03 DIAGNOSIS — I4510 Unspecified right bundle-branch block: Secondary | ICD-10-CM | POA: Diagnosis not present

## 2017-07-09 DIAGNOSIS — R9431 Abnormal electrocardiogram [ECG] [EKG]: Secondary | ICD-10-CM | POA: Diagnosis not present

## 2017-07-09 DIAGNOSIS — R001 Bradycardia, unspecified: Secondary | ICD-10-CM | POA: Diagnosis not present

## 2017-07-09 DIAGNOSIS — R5383 Other fatigue: Secondary | ICD-10-CM | POA: Diagnosis not present

## 2017-07-09 DIAGNOSIS — I10 Essential (primary) hypertension: Secondary | ICD-10-CM | POA: Diagnosis not present

## 2017-07-11 DIAGNOSIS — R05 Cough: Secondary | ICD-10-CM | POA: Diagnosis not present

## 2017-07-11 DIAGNOSIS — J4540 Moderate persistent asthma, uncomplicated: Secondary | ICD-10-CM | POA: Diagnosis not present

## 2017-07-12 DIAGNOSIS — R001 Bradycardia, unspecified: Secondary | ICD-10-CM | POA: Diagnosis not present

## 2017-07-12 DIAGNOSIS — R9431 Abnormal electrocardiogram [ECG] [EKG]: Secondary | ICD-10-CM | POA: Diagnosis not present

## 2017-07-27 DIAGNOSIS — M542 Cervicalgia: Secondary | ICD-10-CM | POA: Diagnosis not present

## 2017-07-27 DIAGNOSIS — S161XXA Strain of muscle, fascia and tendon at neck level, initial encounter: Secondary | ICD-10-CM | POA: Diagnosis not present

## 2017-07-27 DIAGNOSIS — R52 Pain, unspecified: Secondary | ICD-10-CM | POA: Diagnosis not present

## 2017-07-27 DIAGNOSIS — S199XXA Unspecified injury of neck, initial encounter: Secondary | ICD-10-CM | POA: Diagnosis not present

## 2017-07-27 DIAGNOSIS — T1490XA Injury, unspecified, initial encounter: Secondary | ICD-10-CM | POA: Diagnosis not present

## 2017-07-30 DIAGNOSIS — R001 Bradycardia, unspecified: Secondary | ICD-10-CM | POA: Diagnosis not present

## 2017-07-30 DIAGNOSIS — R9431 Abnormal electrocardiogram [ECG] [EKG]: Secondary | ICD-10-CM | POA: Diagnosis not present

## 2017-07-31 DIAGNOSIS — S63656A Sprain of metacarpophalangeal joint of right little finger, initial encounter: Secondary | ICD-10-CM | POA: Diagnosis not present

## 2017-07-31 DIAGNOSIS — M62838 Other muscle spasm: Secondary | ICD-10-CM | POA: Diagnosis not present

## 2017-07-31 DIAGNOSIS — M79644 Pain in right finger(s): Secondary | ICD-10-CM | POA: Diagnosis not present

## 2017-08-01 DIAGNOSIS — R05 Cough: Secondary | ICD-10-CM | POA: Diagnosis not present

## 2017-08-01 DIAGNOSIS — J4540 Moderate persistent asthma, uncomplicated: Secondary | ICD-10-CM | POA: Diagnosis not present

## 2017-08-06 DIAGNOSIS — I517 Cardiomegaly: Secondary | ICD-10-CM | POA: Diagnosis not present

## 2017-08-06 DIAGNOSIS — I371 Nonrheumatic pulmonary valve insufficiency: Secondary | ICD-10-CM | POA: Diagnosis not present

## 2017-08-06 DIAGNOSIS — I361 Nonrheumatic tricuspid (valve) insufficiency: Secondary | ICD-10-CM | POA: Diagnosis not present

## 2017-08-06 DIAGNOSIS — I10 Essential (primary) hypertension: Secondary | ICD-10-CM | POA: Diagnosis not present

## 2017-08-07 DIAGNOSIS — M62838 Other muscle spasm: Secondary | ICD-10-CM | POA: Diagnosis not present

## 2017-08-07 DIAGNOSIS — M542 Cervicalgia: Secondary | ICD-10-CM | POA: Diagnosis not present

## 2017-08-12 DIAGNOSIS — M542 Cervicalgia: Secondary | ICD-10-CM | POA: Diagnosis not present

## 2017-08-12 DIAGNOSIS — M62838 Other muscle spasm: Secondary | ICD-10-CM | POA: Diagnosis not present

## 2017-08-14 DIAGNOSIS — R5383 Other fatigue: Secondary | ICD-10-CM | POA: Diagnosis not present

## 2017-08-14 DIAGNOSIS — M62838 Other muscle spasm: Secondary | ICD-10-CM | POA: Diagnosis not present

## 2017-08-14 DIAGNOSIS — M25511 Pain in right shoulder: Secondary | ICD-10-CM | POA: Diagnosis not present

## 2017-08-14 DIAGNOSIS — M25512 Pain in left shoulder: Secondary | ICD-10-CM | POA: Diagnosis not present

## 2017-08-14 DIAGNOSIS — I361 Nonrheumatic tricuspid (valve) insufficiency: Secondary | ICD-10-CM | POA: Diagnosis not present

## 2017-08-14 DIAGNOSIS — I10 Essential (primary) hypertension: Secondary | ICD-10-CM | POA: Diagnosis not present

## 2017-08-14 DIAGNOSIS — M542 Cervicalgia: Secondary | ICD-10-CM | POA: Diagnosis not present

## 2017-08-14 DIAGNOSIS — E119 Type 2 diabetes mellitus without complications: Secondary | ICD-10-CM | POA: Diagnosis not present

## 2017-08-14 DIAGNOSIS — R05 Cough: Secondary | ICD-10-CM | POA: Diagnosis not present

## 2017-08-14 DIAGNOSIS — J4540 Moderate persistent asthma, uncomplicated: Secondary | ICD-10-CM | POA: Diagnosis not present

## 2017-08-15 DIAGNOSIS — R5383 Other fatigue: Secondary | ICD-10-CM | POA: Diagnosis not present

## 2017-08-15 DIAGNOSIS — R110 Nausea: Secondary | ICD-10-CM | POA: Diagnosis not present

## 2017-08-15 DIAGNOSIS — Z9884 Bariatric surgery status: Secondary | ICD-10-CM | POA: Diagnosis not present

## 2017-08-20 DIAGNOSIS — M62838 Other muscle spasm: Secondary | ICD-10-CM | POA: Diagnosis not present

## 2017-08-20 DIAGNOSIS — M542 Cervicalgia: Secondary | ICD-10-CM | POA: Diagnosis not present

## 2017-08-21 DIAGNOSIS — M62838 Other muscle spasm: Secondary | ICD-10-CM | POA: Diagnosis not present

## 2017-08-21 DIAGNOSIS — M542 Cervicalgia: Secondary | ICD-10-CM | POA: Diagnosis not present

## 2017-08-23 DIAGNOSIS — M542 Cervicalgia: Secondary | ICD-10-CM | POA: Diagnosis not present

## 2017-08-23 DIAGNOSIS — M62838 Other muscle spasm: Secondary | ICD-10-CM | POA: Diagnosis not present

## 2017-08-26 DIAGNOSIS — M62838 Other muscle spasm: Secondary | ICD-10-CM | POA: Diagnosis not present

## 2017-08-26 DIAGNOSIS — M542 Cervicalgia: Secondary | ICD-10-CM | POA: Diagnosis not present

## 2017-08-27 DIAGNOSIS — H25813 Combined forms of age-related cataract, bilateral: Secondary | ICD-10-CM | POA: Diagnosis not present

## 2017-08-27 DIAGNOSIS — E119 Type 2 diabetes mellitus without complications: Secondary | ICD-10-CM | POA: Diagnosis not present

## 2017-08-27 DIAGNOSIS — M62838 Other muscle spasm: Secondary | ICD-10-CM | POA: Diagnosis not present

## 2017-08-27 DIAGNOSIS — H40023 Open angle with borderline findings, high risk, bilateral: Secondary | ICD-10-CM | POA: Diagnosis not present

## 2017-08-27 DIAGNOSIS — M542 Cervicalgia: Secondary | ICD-10-CM | POA: Diagnosis not present

## 2017-08-29 DIAGNOSIS — R05 Cough: Secondary | ICD-10-CM | POA: Diagnosis not present

## 2017-08-29 DIAGNOSIS — M62838 Other muscle spasm: Secondary | ICD-10-CM | POA: Diagnosis not present

## 2017-08-29 DIAGNOSIS — J4540 Moderate persistent asthma, uncomplicated: Secondary | ICD-10-CM | POA: Diagnosis not present

## 2017-08-29 DIAGNOSIS — M542 Cervicalgia: Secondary | ICD-10-CM | POA: Diagnosis not present

## 2017-09-03 DIAGNOSIS — M542 Cervicalgia: Secondary | ICD-10-CM | POA: Diagnosis not present

## 2017-09-03 DIAGNOSIS — M62838 Other muscle spasm: Secondary | ICD-10-CM | POA: Diagnosis not present

## 2017-09-05 DIAGNOSIS — M62838 Other muscle spasm: Secondary | ICD-10-CM | POA: Diagnosis not present

## 2017-09-05 DIAGNOSIS — M542 Cervicalgia: Secondary | ICD-10-CM | POA: Diagnosis not present

## 2017-09-06 DIAGNOSIS — M62838 Other muscle spasm: Secondary | ICD-10-CM | POA: Diagnosis not present

## 2017-09-06 DIAGNOSIS — M542 Cervicalgia: Secondary | ICD-10-CM | POA: Diagnosis not present

## 2017-09-09 DIAGNOSIS — M62838 Other muscle spasm: Secondary | ICD-10-CM | POA: Diagnosis not present

## 2017-09-09 DIAGNOSIS — M542 Cervicalgia: Secondary | ICD-10-CM | POA: Diagnosis not present

## 2017-09-11 DIAGNOSIS — M542 Cervicalgia: Secondary | ICD-10-CM | POA: Diagnosis not present

## 2017-09-11 DIAGNOSIS — M62838 Other muscle spasm: Secondary | ICD-10-CM | POA: Diagnosis not present

## 2017-09-12 ENCOUNTER — Other Ambulatory Visit: Payer: Self-pay | Admitting: "Endocrinology

## 2017-09-12 DIAGNOSIS — E1165 Type 2 diabetes mellitus with hyperglycemia: Secondary | ICD-10-CM | POA: Diagnosis not present

## 2017-09-12 DIAGNOSIS — J4540 Moderate persistent asthma, uncomplicated: Secondary | ICD-10-CM | POA: Diagnosis not present

## 2017-09-12 DIAGNOSIS — R05 Cough: Secondary | ICD-10-CM | POA: Diagnosis not present

## 2017-09-12 DIAGNOSIS — M542 Cervicalgia: Secondary | ICD-10-CM | POA: Diagnosis not present

## 2017-09-12 DIAGNOSIS — M62838 Other muscle spasm: Secondary | ICD-10-CM | POA: Diagnosis not present

## 2017-09-13 LAB — CMP14+EGFR
ALK PHOS: 67 IU/L (ref 39–117)
ALT: 41 IU/L (ref 0–44)
AST: 26 IU/L (ref 0–40)
Albumin/Globulin Ratio: 1.9 (ref 1.2–2.2)
Albumin: 4.4 g/dL (ref 3.6–4.8)
BUN/Creatinine Ratio: 15 (ref 10–24)
BUN: 18 mg/dL (ref 8–27)
Bilirubin Total: 1.3 mg/dL — ABNORMAL HIGH (ref 0.0–1.2)
CALCIUM: 9.6 mg/dL (ref 8.6–10.2)
CO2: 24 mmol/L (ref 20–29)
CREATININE: 1.18 mg/dL (ref 0.76–1.27)
Chloride: 100 mmol/L (ref 96–106)
GFR calc Af Amer: 73 mL/min/{1.73_m2} (ref >59)
GFR, EST NON AFRICAN AMERICAN: 63 mL/min/{1.73_m2} (ref >59)
GLUCOSE: 200 mg/dL — AB (ref 65–99)
Globulin, Total: 2.3 g/dL (ref 1.5–4.5)
POTASSIUM: 4.4 mmol/L (ref 3.5–5.2)
Sodium: 141 mmol/L (ref 134–144)
Total Protein: 6.7 g/dL (ref 6.0–8.5)

## 2017-09-13 LAB — HGB A1C W/O EAG: HEMOGLOBIN A1C: 8.7 % — AB (ref 4.8–5.6)

## 2017-09-16 DIAGNOSIS — M542 Cervicalgia: Secondary | ICD-10-CM | POA: Diagnosis not present

## 2017-09-16 DIAGNOSIS — M62838 Other muscle spasm: Secondary | ICD-10-CM | POA: Diagnosis not present

## 2017-09-17 DIAGNOSIS — M542 Cervicalgia: Secondary | ICD-10-CM | POA: Diagnosis not present

## 2017-09-17 DIAGNOSIS — M62838 Other muscle spasm: Secondary | ICD-10-CM | POA: Diagnosis not present

## 2017-09-18 DIAGNOSIS — M62838 Other muscle spasm: Secondary | ICD-10-CM | POA: Diagnosis not present

## 2017-09-19 ENCOUNTER — Ambulatory Visit (INDEPENDENT_AMBULATORY_CARE_PROVIDER_SITE_OTHER): Payer: Medicare Other | Admitting: "Endocrinology

## 2017-09-19 ENCOUNTER — Encounter: Payer: Self-pay | Admitting: "Endocrinology

## 2017-09-19 VITALS — BP 131/80 | HR 67 | Ht 71.0 in | Wt 187.0 lb

## 2017-09-19 DIAGNOSIS — E782 Mixed hyperlipidemia: Secondary | ICD-10-CM | POA: Diagnosis not present

## 2017-09-19 DIAGNOSIS — E1165 Type 2 diabetes mellitus with hyperglycemia: Secondary | ICD-10-CM | POA: Diagnosis not present

## 2017-09-19 DIAGNOSIS — I10 Essential (primary) hypertension: Secondary | ICD-10-CM

## 2017-09-19 MED ORDER — DULAGLUTIDE 0.75 MG/0.5ML ~~LOC~~ SOAJ: 0.7500 mg | SUBCUTANEOUS | 2 refills | Status: DC

## 2017-09-19 NOTE — Patient Instructions (Signed)

## 2017-09-20 NOTE — Progress Notes (Signed)
Subjective:    Patient ID: Allen [name], male    DOB: 08/02/1950. Patient is being seen in f/u for management of diabetes requested by  Vasireddy, Lanetta Inch, MD  Past Medical History:  Diagnosis Date  . Anemia    on iron since gastric bypass  . Arthritis    mild  . Asthma   . Cancer (HCC)    hx of skin cancer  . Chronic kidney disease    stage 3, now resolved since weight loss  . Claustrophobia   . Diabetes mellitus, type II ([place])    type 2 metformin  . GERD (gastroesophageal reflux disease)    takes nexium  . Hyperlipidemia   . Hypertension   . Pneumonia 15 yrs ago  . Sleep apnea    cpap, no cpap since may 2018 weight loss 80 labs    Past Surgical History:  Procedure Laterality Date  . carpal tunnel Bilateral   . CERVICAL VERTEBRAE EXCISION     and cleaning  . CHOLECYSTECTOMY    . cyst removed left hand  01-28-17  . ESOPHAGOGASTRODUODENOSCOPY (EGD) WITH PROPOFOL N/A 01/31/2017   Procedure: ESOPHAGOGASTRODUODENOSCOPY (EGD) WITH PROPOFOL;  Surgeon: Alphonsa Overall, MD;  Location: [facility];  Service: General;  Laterality: N/A;  . GASTRIC ROUX-EN-Y N/A 06/04/2016   Procedure: LAPAROSCOPIC ROUX-EN-Y GASTRIC BYPASS WITH UPPER ENDOSCOPY;  Surgeon: Clovis Riley, MD;  Location: [facility];  Service: General;  Laterality: N/A;  . NASAL SINUS SURGERY    . TRIGGER FINGER RELEASE     Social History   Socioeconomic History  . Marital status: Married    Spouse name: Not on file  . Number of children: Not on file  . Years of education: Not on file  . Highest education level: Not on file  Occupational History  . Not on file  Social Needs  . Financial resource strain: Not on file  . Food insecurity:    Worry: Not on file    Inability: Not on file  . Transportation needs:    Medical: Not on file    Non-medical: Not on file  Tobacco Use  . Smoking status: Never Smoker  . Smokeless tobacco: Never Used  Substance and Sexual Activity  . Alcohol use: No  . Drug  use: No  . Sexual activity: Yes  Lifestyle  . Physical activity:    Days per week: Not on file    Minutes per session: Not on file  . Stress: Not on file  Relationships  . Social connections:    Talks on phone: Not on file    Gets together: Not on file    Attends religious service: Not on file    Active member of club or organization: Not on file    Attends meetings of clubs or organizations: Not on file    Relationship status: Not on file  Other Topics Concern  . Not on file  Social History Narrative  . Not on file   Outpatient Encounter Medications as of 09/19/2017  Medication Sig  . acetaminophen (TYLENOL) 500 MG tablet Take 1,000 mg by mouth every 8 (eight) hours as needed for mild pain.  Marland Kitchen amLODipine (NORVASC) 10 MG tablet Take 10 mg by mouth every evening.   Marland Kitchen atorvastatin (LIPITOR) 20 MG tablet Take 20 mg by mouth daily.  . Calcium Citrate-Vitamin D (CALCIUM + D PO) Take 1 tablet by mouth 3 (three) times daily.  . Dulaglutide (TRULICITY) 3.35 KT/6.2BW SOPN Inject 0.75 mg  into the skin once a week.  . escitalopram (LEXAPRO) 10 MG tablet Take 10 mg by mouth daily as needed for anxiety.  Marland Kitchen esomeprazole (NEXIUM) 20 MG capsule Take 20 mg by mouth 2 (two) times daily before a meal.  . ferrous sulfate 325 (65 FE) MG tablet Take 325 mg by mouth daily with breakfast.  . Fluticasone-Umeclidin-Vilant (TRELEGY ELLIPTA) 100-62.5-25 MCG/INH AEPB Inhale 1 puff into the lungs daily.  Marland Kitchen gabapentin (NEURONTIN) 300 MG capsule Take 300 mg by mouth at bedtime as needed (cough).   Marland Kitchen levocetirizine (XYZAL) 5 MG tablet Take 5 mg by mouth every evening.  . metFORMIN (GLUCOPHAGE) 1000 MG tablet Take 1 tablet (1,000 mg total) by mouth 2 (two) times daily with a meal.  . mometasone (NASONEX) 50 MCG/ACT nasal spray Place 2 sprays into the nose daily as needed (congestion).   . montelukast (SINGULAIR) 10 MG tablet Take 10 mg by mouth at bedtime.  . Multiple Vitamin (MULTIVITAMIN WITH MINERALS) TABS  tablet Take 1 tablet by mouth daily.  Marland Kitchen olmesartan-hydrochlorothiazide (BENICAR HCT) 40-12.5 MG tablet Take 1 tablet by mouth daily.  . Omega-3 Fatty Acids (FISH OIL) 1000 MG CAPS Take 1,000 mg by mouth 2 (two) times daily.   . [DISCONTINUED] metFORMIN (GLUCOPHAGE) 500 MG tablet Take 2 tablets (1,000 mg total) by mouth 2 (two) times daily with a meal.   No facility-administered encounter medications on file as of 09/19/2017.    ALLERGIES: No Known Allergies VACCINATION STATUS:  There is no immunization history on file for this patient.  Diabetes  He presents for his follow-up diabetic visit. He has type 2 diabetes mellitus. Onset time: He was diagnosed at approximate age of 34 years. His disease course has been worsening. There are no hypoglycemic associated symptoms. Pertinent negatives for hypoglycemia include no confusion, headaches, pallor or seizures. Associated symptoms include fatigue. Pertinent negatives for diabetes include no chest pain, no polydipsia, no polyphagia, no polyuria and no weakness. There are no hypoglycemic complications. Symptoms are worsening. Diabetic complications include nephropathy. Risk factors for coronary artery disease include diabetes mellitus, dyslipidemia, hypertension, male sex, sedentary lifestyle and obesity. Current diabetic treatment includes insulin injections (He is on Lantus 78 units nightly and NovoLog 30-40 units 3 times a day before meals.). His weight is decreasing rapidly (He is status post Roux-en-Y gastric bypass- he has lost 80 pounds overall.). He is following a generally unhealthy and diabetic diet. When asked about meal planning, he reported none. He has not had a previous visit with a dietitian. He participates in exercise intermittently (He participates in golfing activities.). An ACE inhibitor/angiotensin II receptor blocker is being taken. Eye exam is current.  Hyperlipidemia  This is a chronic problem. The current episode started more than 1  year ago. The problem is uncontrolled. Exacerbating diseases include chronic renal disease and diabetes. He has no history of obesity. Pertinent negatives include no chest pain, myalgias or shortness of breath. Current antihyperlipidemic treatment includes statins and bile acid squestrants. Compliance problems include adherence to diet.  Risk factors for coronary artery disease include diabetes mellitus, dyslipidemia, hypertension, male sex, obesity and a sedentary lifestyle.  Hypertension  This is a chronic problem. The current episode started more than 1 year ago. The problem is uncontrolled. Pertinent negatives include no chest pain, headaches, neck pain, palpitations or shortness of breath. Risk factors for coronary artery disease include diabetes mellitus, dyslipidemia and sedentary lifestyle. Past treatments include angiotensin blockers. Hypertensive end-organ damage includes kidney disease. Identifiable causes of hypertension  include chronic renal disease.     Review of Systems  Constitutional: Positive for fatigue. Negative for chills, fever and unexpected weight change.  HENT: Negative for dental problem, mouth sores and trouble swallowing.   Eyes: Negative for visual disturbance.  Respiratory: Negative for cough, choking, chest tightness, shortness of breath and wheezing.   Cardiovascular: Negative for chest pain, palpitations and leg swelling.  Gastrointestinal: Negative for abdominal distention, abdominal pain, constipation, diarrhea, nausea and vomiting.  Endocrine: Negative for polydipsia, polyphagia and polyuria.  Genitourinary: Negative for dysuria, flank pain, hematuria and urgency.  Musculoskeletal: Negative for back pain, gait problem, myalgias and neck pain.  Skin: Negative for pallor, rash and wound.  Neurological: Negative for seizures, syncope, weakness, numbness and headaches.  Psychiatric/Behavioral: Negative for confusion and dysphoric mood.    Objective:    BP 131/80    Pulse 67   Ht 5' 11"  (1.803 m)   Wt 187 lb (84.8 kg)   BMI 26.08 kg/m   Wt Readings from Last 3 Encounters:  09/19/17 187 lb (84.8 kg)  05/20/17 189 lb (85.7 kg)  01/31/17 180 lb (81.6 kg)    Physical Exam  Constitutional: He is oriented to person, place, and time. He appears well-developed and well-nourished. He is cooperative. No distress.  HENT:  Head: Normocephalic and atraumatic.  Eyes: EOM are normal.  Neck: Normal range of motion. Neck supple. No tracheal deviation present. No thyromegaly present.  Cardiovascular: Normal rate, S1 normal and S2 normal. Exam reveals no gallop.  No murmur heard. Pulses:      Dorsalis pedis pulses are 1+ on the right side, and 1+ on the left side.       Posterior tibial pulses are 1+ on the right side, and 1+ on the left side.  Pulmonary/Chest: Effort normal. No respiratory distress. He has no wheezes.  Abdominal: He exhibits no distension. There is no tenderness. There is no guarding and no CVA tenderness.  Musculoskeletal: He exhibits no edema.       Right shoulder: He exhibits no swelling and no deformity.  Neurological: He is alert and oriented to person, place, and time. He has normal strength and normal reflexes. No cranial nerve deficit or sensory deficit. Gait normal.  Skin: Skin is warm and dry. No rash noted. No cyanosis. Nails show no clubbing.  Psychiatric: He has a normal mood and affect. His speech is normal and behavior is normal. Judgment and thought content normal. Cognition and memory are normal.   Recent Results (from the past 2160 hour(s))  CMP14+EGFR     Status: Abnormal   Collection Time: 09/12/17  8:37 AM  Result Value Ref Range   Glucose 200 (H) 65 - 99 mg/dL   BUN 18 8 - 27 mg/dL   Creatinine, Ser 1.18 0.76 - 1.27 mg/dL   GFR calc non Af Amer 63 >59 mL/min/1.73   GFR calc Af Amer 73 >59 mL/min/1.73   BUN/Creatinine Ratio 15 10 - 24   Sodium 141 134 - 144 mmol/L   Potassium 4.4 3.5 - 5.2 mmol/L   Chloride 100 96  - 106 mmol/L   CO2 24 20 - 29 mmol/L   Calcium 9.6 8.6 - 10.2 mg/dL   Total Protein 6.7 6.0 - 8.5 g/dL   Albumin 4.4 3.6 - 4.8 g/dL   Globulin, Total 2.3 1.5 - 4.5 g/dL   Albumin/Globulin Ratio 1.9 1.2 - 2.2   Bilirubin Total 1.3 (H) 0.0 - 1.2 mg/dL   Alkaline Phosphatase 67 39 -  117 IU/L   AST 26 0 - 40 IU/L   ALT 41 0 - 44 IU/L  Hgb A1c w/o eAG     Status: Abnormal   Collection Time: 09/12/17  8:37 AM  Result Value Ref Range   Hgb A1c MFr Bld 8.7 (H) 4.8 - 5.6 %    Comment:          Prediabetes: 5.7 - 6.4          Diabetes: >6.4          Glycemic control for adults with diabetes: <7.0    Lipid Panel     Component Value Date/Time   CHOL 161 05/15/2017   TRIG 89 05/15/2017   HDL 47 05/15/2017   CHOLHDL 3.9 04/25/2016 1119   VLDL 27 04/25/2016 1119   LDLCALC 96 05/15/2017     Assessment & Plan:   1. Uncontrolled type 2 diabetes mellitus  - Patient has currently uncontrolled symptomatic type 2 DM since  67 years of age. -Status post Roux-en-Y gastric bypass, lost 80 pounds overall.   -His recent A1c is higher at 8.7% increasing from 7.4%.    - His diabetes is complicated by mild  CKD  and patient remains at a high risk for more acute and chronic complications of diabetes which include CAD, CVA, CKD, retinopathy, and neuropathy. These are all discussed in detail with the patient.  - I have counseled the patient on diet management and weight loss, by adopting a carbohydrate restricted/protein rich diet.  -  Suggestion is made for him to avoid simple carbohydrates  from his diet including Cakes, Sweet Desserts / Pastries, Ice Cream, Soda (diet and regular), Sweet Tea, Candies, Chips, Cookies, Store Bought Juices, Alcohol in Excess of  1-2 drinks a day, Artificial Sweeteners, and "Sugar-free" Products. This will help patient to have stable blood glucose profile and potentially avoid unintended weight gain.   - I encouraged the patient to switch to  unprocessed or minimally  processed complex starch and increased protein intake (animal or plant source), fruits, and vegetables.  - Patient is advised to stick to a routine mealtimes to eat 3 meals  a day and avoid unnecessary snacks ( to snack only to correct hypoglycemia).   - I have approached patient with the following individualized plan to manage diabetes and patient agrees:   -His labs are consistent with normal renal function.  I advised him to continue metformin 1000 mg p.o. twice daily with proper hydration.    -I discussed and initiated Trulicity 6.30 mg subcutaneously weekly, to advance as tolerated.    -He is advised to monitor blood glucose fasting 3 times a week and report if blood glucose readings are greater than 200 mg/day . -He will be considered for reinitiation of basal insulin, if he continues to lose control.   - Patient specific target  A1c;  LDL, HDL, Triglycerides, and  Waist Circumference were discussed in detail.  2) BP/HTN: His blood pressure is  controlled to target.   He is advised to continue on olmesartan/hydrochlorothiazide .  3) Lipids/HPL: Recent lipid panel showed uncontrolled LDL of 96.  He is advised to continue atorvastatin 20 mg p.o. nightly.    4)  Weight/Diet: Status post Roux-en-Y gastric bypass, lost 80 pounds overall. CDE Consult in progress , exercise, and detailed carbohydrates information provided.  5) Chronic Care/Health Maintenance:  -Patient is on ACEI/ARB and Statin medications and encouraged to continue to follow up with Ophthalmology, Podiatrist at least yearly or  according to recommendations, and advised to   stay away from smoking. I have recommended yearly flu vaccine and pneumonia vaccination at least every 5 years; moderate intensity exercise for up to 150 minutes weekly; and  sleep for at least 7 hours a day.  -Given his unexplained fatigue, he may need measurement of his total testosterone on fasting in the a.m. hours.  He plans to get this during his  next visit with his PMD.  - I advised patient to maintain close follow up with Vasireddy, Lanetta Inch, MD for primary care needs.  - Time spent with the patient: 25 min, of which >50% was spent in reviewing his  current and  previous labs, previous treatments, and medications doses and developing a plan for long-term care.  [facility] participated in the discussions, expressed understanding, and voiced agreement with the above plans.  All questions were answered to his satisfaction. he is encouraged to contact clinic should he have any questions or concerns prior to his return visit.   Follow up plan: He'll return in 3 months with repeat CMP and A1c. Glade Lloyd, MD Phone: [phone]  Fax: [phone]  -  This note was partially dictated with voice recognition software. Similar sounding words can be transcribed inadequately or may not  be corrected upon review.  09/20/2017, 11:30 AM

## 2017-09-24 DIAGNOSIS — M62838 Other muscle spasm: Secondary | ICD-10-CM | POA: Diagnosis not present

## 2017-09-24 DIAGNOSIS — M542 Cervicalgia: Secondary | ICD-10-CM | POA: Diagnosis not present

## 2017-09-26 DIAGNOSIS — R05 Cough: Secondary | ICD-10-CM | POA: Diagnosis not present

## 2017-09-26 DIAGNOSIS — J4540 Moderate persistent asthma, uncomplicated: Secondary | ICD-10-CM | POA: Diagnosis not present

## 2017-09-27 DIAGNOSIS — M62838 Other muscle spasm: Secondary | ICD-10-CM | POA: Diagnosis not present

## 2017-09-27 DIAGNOSIS — M542 Cervicalgia: Secondary | ICD-10-CM | POA: Diagnosis not present

## 2017-09-30 DIAGNOSIS — I10 Essential (primary) hypertension: Secondary | ICD-10-CM | POA: Diagnosis not present

## 2017-09-30 DIAGNOSIS — M542 Cervicalgia: Secondary | ICD-10-CM | POA: Diagnosis not present

## 2017-09-30 DIAGNOSIS — F341 Dysthymic disorder: Secondary | ICD-10-CM | POA: Diagnosis not present

## 2017-09-30 DIAGNOSIS — M62838 Other muscle spasm: Secondary | ICD-10-CM | POA: Diagnosis not present

## 2017-09-30 DIAGNOSIS — R05 Cough: Secondary | ICD-10-CM | POA: Diagnosis not present

## 2017-09-30 DIAGNOSIS — F419 Anxiety disorder, unspecified: Secondary | ICD-10-CM | POA: Diagnosis not present

## 2017-09-30 DIAGNOSIS — E119 Type 2 diabetes mellitus without complications: Secondary | ICD-10-CM | POA: Diagnosis not present

## 2017-09-30 DIAGNOSIS — K219 Gastro-esophageal reflux disease without esophagitis: Secondary | ICD-10-CM | POA: Diagnosis not present

## 2017-09-30 DIAGNOSIS — M545 Low back pain: Secondary | ICD-10-CM | POA: Diagnosis not present

## 2017-10-03 DIAGNOSIS — M62838 Other muscle spasm: Secondary | ICD-10-CM | POA: Diagnosis not present

## 2017-10-03 DIAGNOSIS — M542 Cervicalgia: Secondary | ICD-10-CM | POA: Diagnosis not present

## 2017-10-07 DIAGNOSIS — M545 Low back pain: Secondary | ICD-10-CM | POA: Diagnosis not present

## 2017-10-07 DIAGNOSIS — M62838 Other muscle spasm: Secondary | ICD-10-CM | POA: Diagnosis not present

## 2017-10-07 DIAGNOSIS — M542 Cervicalgia: Secondary | ICD-10-CM | POA: Diagnosis not present

## 2017-10-10 DIAGNOSIS — M62838 Other muscle spasm: Secondary | ICD-10-CM | POA: Diagnosis not present

## 2017-10-10 DIAGNOSIS — M545 Low back pain: Secondary | ICD-10-CM | POA: Diagnosis not present

## 2017-10-10 DIAGNOSIS — J4540 Moderate persistent asthma, uncomplicated: Secondary | ICD-10-CM | POA: Diagnosis not present

## 2017-10-10 DIAGNOSIS — M542 Cervicalgia: Secondary | ICD-10-CM | POA: Diagnosis not present

## 2017-10-10 DIAGNOSIS — R05 Cough: Secondary | ICD-10-CM | POA: Diagnosis not present

## 2017-10-14 DIAGNOSIS — M62838 Other muscle spasm: Secondary | ICD-10-CM | POA: Diagnosis not present

## 2017-10-14 DIAGNOSIS — M545 Low back pain: Secondary | ICD-10-CM | POA: Diagnosis not present

## 2017-10-14 DIAGNOSIS — M542 Cervicalgia: Secondary | ICD-10-CM | POA: Diagnosis not present

## 2017-10-15 DIAGNOSIS — M542 Cervicalgia: Secondary | ICD-10-CM | POA: Diagnosis not present

## 2017-10-16 DIAGNOSIS — H25812 Combined forms of age-related cataract, left eye: Secondary | ICD-10-CM | POA: Diagnosis not present

## 2017-10-17 DIAGNOSIS — M62838 Other muscle spasm: Secondary | ICD-10-CM | POA: Diagnosis not present

## 2017-10-17 DIAGNOSIS — M542 Cervicalgia: Secondary | ICD-10-CM | POA: Diagnosis not present

## 2017-10-17 DIAGNOSIS — M545 Low back pain: Secondary | ICD-10-CM | POA: Diagnosis not present

## 2017-10-21 DIAGNOSIS — M545 Low back pain: Secondary | ICD-10-CM | POA: Diagnosis not present

## 2017-10-21 DIAGNOSIS — M542 Cervicalgia: Secondary | ICD-10-CM | POA: Diagnosis not present

## 2017-10-21 DIAGNOSIS — Z23 Encounter for immunization: Secondary | ICD-10-CM | POA: Diagnosis not present

## 2017-10-21 DIAGNOSIS — M62838 Other muscle spasm: Secondary | ICD-10-CM | POA: Diagnosis not present

## 2017-10-24 DIAGNOSIS — M545 Low back pain: Secondary | ICD-10-CM | POA: Diagnosis not present

## 2017-10-24 DIAGNOSIS — J4540 Moderate persistent asthma, uncomplicated: Secondary | ICD-10-CM | POA: Diagnosis not present

## 2017-10-24 DIAGNOSIS — R05 Cough: Secondary | ICD-10-CM | POA: Diagnosis not present

## 2017-10-24 DIAGNOSIS — M62838 Other muscle spasm: Secondary | ICD-10-CM | POA: Diagnosis not present

## 2017-10-24 DIAGNOSIS — M542 Cervicalgia: Secondary | ICD-10-CM | POA: Diagnosis not present

## 2017-10-28 DIAGNOSIS — M542 Cervicalgia: Secondary | ICD-10-CM | POA: Diagnosis not present

## 2017-10-28 DIAGNOSIS — M62838 Other muscle spasm: Secondary | ICD-10-CM | POA: Diagnosis not present

## 2017-10-28 DIAGNOSIS — M545 Low back pain: Secondary | ICD-10-CM | POA: Diagnosis not present

## 2017-10-31 DIAGNOSIS — Z9884 Bariatric surgery status: Secondary | ICD-10-CM | POA: Diagnosis not present

## 2017-10-31 DIAGNOSIS — J449 Chronic obstructive pulmonary disease, unspecified: Secondary | ICD-10-CM | POA: Diagnosis not present

## 2017-10-31 DIAGNOSIS — G4733 Obstructive sleep apnea (adult) (pediatric): Secondary | ICD-10-CM | POA: Diagnosis not present

## 2017-10-31 DIAGNOSIS — J301 Allergic rhinitis due to pollen: Secondary | ICD-10-CM | POA: Diagnosis not present

## 2017-11-05 DIAGNOSIS — M542 Cervicalgia: Secondary | ICD-10-CM | POA: Diagnosis not present

## 2017-11-06 DIAGNOSIS — J4540 Moderate persistent asthma, uncomplicated: Secondary | ICD-10-CM | POA: Diagnosis not present

## 2017-11-06 DIAGNOSIS — R05 Cough: Secondary | ICD-10-CM | POA: Diagnosis not present

## 2017-11-07 DIAGNOSIS — H259 Unspecified age-related cataract: Secondary | ICD-10-CM | POA: Diagnosis not present

## 2017-11-07 DIAGNOSIS — I10 Essential (primary) hypertension: Secondary | ICD-10-CM | POA: Diagnosis not present

## 2017-11-07 DIAGNOSIS — E1136 Type 2 diabetes mellitus with diabetic cataract: Secondary | ICD-10-CM | POA: Diagnosis not present

## 2017-11-07 DIAGNOSIS — K219 Gastro-esophageal reflux disease without esophagitis: Secondary | ICD-10-CM | POA: Diagnosis not present

## 2017-11-07 DIAGNOSIS — H9190 Unspecified hearing loss, unspecified ear: Secondary | ICD-10-CM | POA: Diagnosis not present

## 2017-11-07 DIAGNOSIS — G4730 Sleep apnea, unspecified: Secondary | ICD-10-CM | POA: Diagnosis not present

## 2017-11-07 DIAGNOSIS — F40240 Claustrophobia: Secondary | ICD-10-CM | POA: Diagnosis not present

## 2017-11-07 DIAGNOSIS — Z794 Long term (current) use of insulin: Secondary | ICD-10-CM | POA: Diagnosis not present

## 2017-11-07 DIAGNOSIS — H25812 Combined forms of age-related cataract, left eye: Secondary | ICD-10-CM | POA: Diagnosis not present

## 2017-11-07 DIAGNOSIS — F419 Anxiety disorder, unspecified: Secondary | ICD-10-CM | POA: Diagnosis not present

## 2017-11-07 DIAGNOSIS — J45909 Unspecified asthma, uncomplicated: Secondary | ICD-10-CM | POA: Diagnosis not present

## 2017-11-14 DIAGNOSIS — R110 Nausea: Secondary | ICD-10-CM | POA: Diagnosis not present

## 2017-11-19 DIAGNOSIS — F419 Anxiety disorder, unspecified: Secondary | ICD-10-CM | POA: Diagnosis not present

## 2017-11-19 DIAGNOSIS — M542 Cervicalgia: Secondary | ICD-10-CM | POA: Diagnosis not present

## 2017-11-19 DIAGNOSIS — Z7984 Long term (current) use of oral hypoglycemic drugs: Secondary | ICD-10-CM | POA: Diagnosis not present

## 2017-11-19 DIAGNOSIS — M47892 Other spondylosis, cervical region: Secondary | ICD-10-CM | POA: Diagnosis not present

## 2017-11-19 DIAGNOSIS — J45909 Unspecified asthma, uncomplicated: Secondary | ICD-10-CM | POA: Diagnosis not present

## 2017-11-19 DIAGNOSIS — F40240 Claustrophobia: Secondary | ICD-10-CM | POA: Diagnosis not present

## 2017-11-19 DIAGNOSIS — I10 Essential (primary) hypertension: Secondary | ICD-10-CM | POA: Diagnosis not present

## 2017-11-19 DIAGNOSIS — Z79899 Other long term (current) drug therapy: Secondary | ICD-10-CM | POA: Diagnosis not present

## 2017-11-19 DIAGNOSIS — K219 Gastro-esophageal reflux disease without esophagitis: Secondary | ICD-10-CM | POA: Diagnosis not present

## 2017-11-19 DIAGNOSIS — E119 Type 2 diabetes mellitus without complications: Secondary | ICD-10-CM | POA: Diagnosis not present

## 2017-11-19 DIAGNOSIS — M4802 Spinal stenosis, cervical region: Secondary | ICD-10-CM | POA: Diagnosis not present

## 2017-11-20 DIAGNOSIS — J4540 Moderate persistent asthma, uncomplicated: Secondary | ICD-10-CM | POA: Diagnosis not present

## 2017-11-20 DIAGNOSIS — R05 Cough: Secondary | ICD-10-CM | POA: Diagnosis not present

## 2017-11-27 ENCOUNTER — Telehealth: Payer: Self-pay | Admitting: "Endocrinology

## 2017-11-27 DIAGNOSIS — M542 Cervicalgia: Secondary | ICD-10-CM | POA: Diagnosis not present

## 2017-11-27 DIAGNOSIS — M5020 Other cervical disc displacement, unspecified cervical region: Secondary | ICD-10-CM | POA: Diagnosis not present

## 2017-11-27 DIAGNOSIS — M5412 Radiculopathy, cervical region: Secondary | ICD-10-CM | POA: Diagnosis not present

## 2017-11-27 NOTE — Telephone Encounter (Signed)
Allen [name] is calling asking to talk to Dr. Dorris Fetch in regards to being treated by his Dr. For a Bulging disk he is on a steroid pak and is needing to have steroid injections in his back an it is running his blood sugars high, please advise?

## 2017-11-27 NOTE — Telephone Encounter (Signed)
He will probably  need insulin for the duration of the steroids. He can come and get a sample of Toujeo pen, to take 20 units nightly, while monitoring BG 4 times a day. Call back if >200 x 3. Continue his other medications same.

## 2017-11-28 NOTE — Telephone Encounter (Signed)
Patient is aware of the recommendation and will come by the office to pick up Toujeo Pen

## 2017-12-02 DIAGNOSIS — D101 Benign neoplasm of tongue: Secondary | ICD-10-CM | POA: Diagnosis not present

## 2017-12-02 DIAGNOSIS — J0121 Acute recurrent ethmoidal sinusitis: Secondary | ICD-10-CM | POA: Diagnosis not present

## 2017-12-05 DIAGNOSIS — J4540 Moderate persistent asthma, uncomplicated: Secondary | ICD-10-CM | POA: Diagnosis not present

## 2017-12-05 DIAGNOSIS — R05 Cough: Secondary | ICD-10-CM | POA: Diagnosis not present

## 2017-12-06 DIAGNOSIS — H25811 Combined forms of age-related cataract, right eye: Secondary | ICD-10-CM | POA: Diagnosis not present

## 2017-12-09 DIAGNOSIS — D229 Melanocytic nevi, unspecified: Secondary | ICD-10-CM | POA: Diagnosis not present

## 2017-12-09 DIAGNOSIS — D692 Other nonthrombocytopenic purpura: Secondary | ICD-10-CM | POA: Diagnosis not present

## 2017-12-09 DIAGNOSIS — L739 Follicular disorder, unspecified: Secondary | ICD-10-CM | POA: Diagnosis not present

## 2017-12-09 DIAGNOSIS — L919 Hypertrophic disorder of the skin, unspecified: Secondary | ICD-10-CM | POA: Diagnosis not present

## 2017-12-09 DIAGNOSIS — D485 Neoplasm of uncertain behavior of skin: Secondary | ICD-10-CM | POA: Diagnosis not present

## 2017-12-12 DIAGNOSIS — Z7984 Long term (current) use of oral hypoglycemic drugs: Secondary | ICD-10-CM | POA: Diagnosis not present

## 2017-12-12 DIAGNOSIS — Z9842 Cataract extraction status, left eye: Secondary | ICD-10-CM | POA: Diagnosis not present

## 2017-12-12 DIAGNOSIS — E1136 Type 2 diabetes mellitus with diabetic cataract: Secondary | ICD-10-CM | POA: Diagnosis not present

## 2017-12-12 DIAGNOSIS — J45909 Unspecified asthma, uncomplicated: Secondary | ICD-10-CM | POA: Diagnosis not present

## 2017-12-12 DIAGNOSIS — Z961 Presence of intraocular lens: Secondary | ICD-10-CM | POA: Diagnosis not present

## 2017-12-12 DIAGNOSIS — H25811 Combined forms of age-related cataract, right eye: Secondary | ICD-10-CM | POA: Diagnosis not present

## 2017-12-12 DIAGNOSIS — Z79899 Other long term (current) drug therapy: Secondary | ICD-10-CM | POA: Diagnosis not present

## 2017-12-12 DIAGNOSIS — K219 Gastro-esophageal reflux disease without esophagitis: Secondary | ICD-10-CM | POA: Diagnosis not present

## 2017-12-12 DIAGNOSIS — H43821 Vitreomacular adhesion, right eye: Secondary | ICD-10-CM | POA: Diagnosis not present

## 2017-12-12 DIAGNOSIS — E7800 Pure hypercholesterolemia, unspecified: Secondary | ICD-10-CM | POA: Diagnosis not present

## 2017-12-12 DIAGNOSIS — H9190 Unspecified hearing loss, unspecified ear: Secondary | ICD-10-CM | POA: Diagnosis not present

## 2017-12-12 DIAGNOSIS — I10 Essential (primary) hypertension: Secondary | ICD-10-CM | POA: Diagnosis not present

## 2017-12-12 DIAGNOSIS — F40240 Claustrophobia: Secondary | ICD-10-CM | POA: Diagnosis not present

## 2017-12-12 DIAGNOSIS — G4730 Sleep apnea, unspecified: Secondary | ICD-10-CM | POA: Diagnosis not present

## 2017-12-12 DIAGNOSIS — H259 Unspecified age-related cataract: Secondary | ICD-10-CM | POA: Diagnosis not present

## 2017-12-12 DIAGNOSIS — Z9884 Bariatric surgery status: Secondary | ICD-10-CM | POA: Diagnosis not present

## 2017-12-13 DIAGNOSIS — M542 Cervicalgia: Secondary | ICD-10-CM | POA: Diagnosis not present

## 2017-12-13 DIAGNOSIS — M545 Low back pain: Secondary | ICD-10-CM | POA: Diagnosis not present

## 2017-12-13 DIAGNOSIS — M5412 Radiculopathy, cervical region: Secondary | ICD-10-CM | POA: Diagnosis not present

## 2017-12-15 ENCOUNTER — Other Ambulatory Visit: Payer: Self-pay | Admitting: "Endocrinology

## 2017-12-18 DIAGNOSIS — R05 Cough: Secondary | ICD-10-CM | POA: Diagnosis not present

## 2017-12-18 DIAGNOSIS — J4540 Moderate persistent asthma, uncomplicated: Secondary | ICD-10-CM | POA: Diagnosis not present

## 2017-12-24 ENCOUNTER — Ambulatory Visit: Payer: Medicare Other | Admitting: "Endocrinology

## 2017-12-27 DIAGNOSIS — I119 Hypertensive heart disease without heart failure: Secondary | ICD-10-CM | POA: Diagnosis not present

## 2017-12-27 DIAGNOSIS — E119 Type 2 diabetes mellitus without complications: Secondary | ICD-10-CM | POA: Diagnosis not present

## 2017-12-27 DIAGNOSIS — I440 Atrioventricular block, first degree: Secondary | ICD-10-CM | POA: Diagnosis not present

## 2017-12-27 DIAGNOSIS — R9431 Abnormal electrocardiogram [ECG] [EKG]: Secondary | ICD-10-CM | POA: Diagnosis not present

## 2018-01-01 DIAGNOSIS — M5412 Radiculopathy, cervical region: Secondary | ICD-10-CM | POA: Diagnosis not present

## 2018-01-01 DIAGNOSIS — E119 Type 2 diabetes mellitus without complications: Secondary | ICD-10-CM | POA: Diagnosis not present

## 2018-01-01 DIAGNOSIS — J4540 Moderate persistent asthma, uncomplicated: Secondary | ICD-10-CM | POA: Diagnosis not present

## 2018-01-01 DIAGNOSIS — R05 Cough: Secondary | ICD-10-CM | POA: Diagnosis not present

## 2018-01-07 DIAGNOSIS — J0121 Acute recurrent ethmoidal sinusitis: Secondary | ICD-10-CM | POA: Diagnosis not present

## 2018-01-07 DIAGNOSIS — E1140 Type 2 diabetes mellitus with diabetic neuropathy, unspecified: Secondary | ICD-10-CM | POA: Diagnosis not present

## 2018-01-07 DIAGNOSIS — H9211 Otorrhea, right ear: Secondary | ICD-10-CM | POA: Diagnosis not present

## 2018-01-08 DIAGNOSIS — J0121 Acute recurrent ethmoidal sinusitis: Secondary | ICD-10-CM | POA: Diagnosis not present

## 2018-01-13 ENCOUNTER — Other Ambulatory Visit: Payer: Self-pay | Admitting: "Endocrinology

## 2018-01-13 DIAGNOSIS — E1165 Type 2 diabetes mellitus with hyperglycemia: Secondary | ICD-10-CM | POA: Diagnosis not present

## 2018-01-20 ENCOUNTER — Ambulatory Visit (INDEPENDENT_AMBULATORY_CARE_PROVIDER_SITE_OTHER): Payer: Medicare Other | Admitting: "Endocrinology

## 2018-01-20 ENCOUNTER — Encounter: Payer: Self-pay | Admitting: "Endocrinology

## 2018-01-20 VITALS — BP 131/89 | HR 65 | Ht 71.0 in | Wt 188.0 lb

## 2018-01-20 DIAGNOSIS — R05 Cough: Secondary | ICD-10-CM | POA: Diagnosis not present

## 2018-01-20 DIAGNOSIS — J4540 Moderate persistent asthma, uncomplicated: Secondary | ICD-10-CM | POA: Diagnosis not present

## 2018-01-20 DIAGNOSIS — E1165 Type 2 diabetes mellitus with hyperglycemia: Secondary | ICD-10-CM

## 2018-01-20 DIAGNOSIS — I10 Essential (primary) hypertension: Secondary | ICD-10-CM

## 2018-01-20 DIAGNOSIS — E782 Mixed hyperlipidemia: Secondary | ICD-10-CM

## 2018-01-20 LAB — CBC WITH DIFFERENTIAL/PLATELET
BASOS ABS: 0.1 10*3/uL (ref 0.0–0.2)
Basos: 1 %
EOS (ABSOLUTE): 0.3 10*3/uL (ref 0.0–0.4)
Eos: 3 %
Hematocrit: 41.4 % (ref 37.5–51.0)
Hemoglobin: 13.8 g/dL (ref 13.0–17.7)
IMMATURE GRANULOCYTES: 0 %
Immature Grans (Abs): 0 10*3/uL (ref 0.0–0.1)
Lymphocytes Absolute: 2.3 10*3/uL (ref 0.7–3.1)
Lymphs: 30 %
MCH: 29.6 pg (ref 26.6–33.0)
MCHC: 33.3 g/dL (ref 31.5–35.7)
MCV: 89 fL (ref 79–97)
Monocytes Absolute: 0.4 10*3/uL (ref 0.1–0.9)
Monocytes: 5 %
NEUTROS PCT: 61 %
Neutrophils Absolute: 4.8 10*3/uL (ref 1.4–7.0)
PLATELETS: 210 10*3/uL (ref 150–450)
RBC: 4.66 x10E6/uL (ref 4.14–5.80)
RDW: 12.6 % (ref 12.3–15.4)
WBC: 7.9 10*3/uL (ref 3.4–10.8)

## 2018-01-20 LAB — COMPREHENSIVE METABOLIC PANEL
ALT: 54 IU/L — AB (ref 0–44)
AST: 46 IU/L — ABNORMAL HIGH (ref 0–40)
Albumin/Globulin Ratio: 2.1 (ref 1.2–2.2)
Albumin: 4.2 g/dL (ref 3.6–4.8)
Alkaline Phosphatase: 73 IU/L (ref 39–117)
BILIRUBIN TOTAL: 1 mg/dL (ref 0.0–1.2)
BUN/Creatinine Ratio: 14 (ref 10–24)
BUN: 16 mg/dL (ref 8–27)
CALCIUM: 9.4 mg/dL (ref 8.6–10.2)
CHLORIDE: 104 mmol/L (ref 96–106)
CO2: 24 mmol/L (ref 20–29)
Creatinine, Ser: 1.15 mg/dL (ref 0.76–1.27)
GFR calc non Af Amer: 65 mL/min/{1.73_m2} (ref >59)
GFR, EST AFRICAN AMERICAN: 76 mL/min/{1.73_m2} (ref >59)
GLUCOSE: 114 mg/dL — AB (ref 65–99)
Globulin, Total: 2 g/dL (ref 1.5–4.5)
Potassium: 4.2 mmol/L (ref 3.5–5.2)
Sodium: 145 mmol/L — ABNORMAL HIGH (ref 134–144)
TOTAL PROTEIN: 6.2 g/dL (ref 6.0–8.5)

## 2018-01-20 LAB — SPECIMEN STATUS REPORT

## 2018-01-20 LAB — HGB A1C W/O EAG: HEMOGLOBIN A1C: 7.2 % — AB (ref 4.8–5.6)

## 2018-01-20 LAB — TSH+FREE T4
Free T4 by Dialysis: 1.1 ng/dL
TSH: 1.6 uU/mL

## 2018-01-20 MED ORDER — DULAGLUTIDE 0.75 MG/0.5ML ~~LOC~~ SOAJ: SUBCUTANEOUS | 1 refills | Status: DC

## 2018-01-20 MED ORDER — METFORMIN HCL 1000 MG PO TABS: 1000.0000 mg | ORAL_TABLET | Freq: Two times a day (BID) | ORAL | 0 refills | Status: DC

## 2018-01-20 NOTE — Progress Notes (Signed)
Endocrinology follow-up note   Subjective:    Patient ID: Allen [name], male    DOB: 08-Aug-1950. Patient is being seen in f/u for management of diabetes requested by  Allen [name], Allen Inch, Allen [name]  Past Medical History:  Diagnosis Date  . Anemia    on iron since gastric bypass  . Arthritis    mild  . Asthma   . Cancer (HCC)    hx of skin cancer  . Chronic kidney disease    stage 3, now resolved since weight loss  . Claustrophobia   . Diabetes mellitus, type II ([place])    type 2 metformin  . GERD (gastroesophageal reflux disease)    takes nexium  . Hyperlipidemia   . Hypertension   . Pneumonia 15 yrs ago  . Sleep apnea    cpap, no cpap since may 2018 weight loss 80 labs    Past Surgical History:  Procedure Laterality Date  . carpal tunnel Bilateral   . CERVICAL VERTEBRAE EXCISION     and cleaning  . CHOLECYSTECTOMY    . cyst removed left hand  01-28-17  . ESOPHAGOGASTRODUODENOSCOPY (EGD) WITH PROPOFOL N/A 01/31/2017   Procedure: ESOPHAGOGASTRODUODENOSCOPY (EGD) WITH PROPOFOL;  Surgeon: Alphonsa Overall, Allen [name];  Location: [facility];  Service: General;  Laterality: N/A;  . GASTRIC ROUX-EN-Y N/A 06/04/2016   Procedure: LAPAROSCOPIC ROUX-EN-Y GASTRIC BYPASS WITH UPPER ENDOSCOPY;  Surgeon: Clovis Riley, Allen [name];  Location: [facility];  Service: General;  Laterality: N/A;  . NASAL SINUS SURGERY    . TRIGGER FINGER RELEASE     Social History   Socioeconomic History  . Marital status: Married    Spouse name: Not on file  . Number of children: Not on file  . Years of education: Not on file  . Highest education level: Not on file  Occupational History  . Not on file  Social Needs  . Financial resource strain: Not on file  . Food insecurity:    Worry: Not on file    Inability: Not on file  . Transportation needs:    Medical: Not on file    Non-medical: Not on file  Tobacco Use  . Smoking status: Never Smoker  . Smokeless tobacco: Never Used  Substance and Sexual  Activity  . Alcohol use: No  . Drug use: No  . Sexual activity: Yes  Lifestyle  . Physical activity:    Days per week: Not on file    Minutes per session: Not on file  . Stress: Not on file  Relationships  . Social connections:    Talks on phone: Not on file    Gets together: Not on file    Attends religious service: Not on file    Active member of club or organization: Not on file    Attends meetings of clubs or organizations: Not on file    Relationship status: Not on file  Other Topics Concern  . Not on file  Social History Narrative  . Not on file   Outpatient Encounter Medications as of 01/20/2018  Medication Sig  . Calcium Carb-Cholecalciferol (CALCIUM 500+D3 PO) Take by mouth 3 (three) times daily.  . Cyanocobalamin (VITAMIN B-12 PO) Take 300 mcg by mouth daily.  . Magnesium 400 MG CAPS Take by mouth 2 (two) times daily.  . metoCLOPramide (REGLAN) 5 MG tablet Take 5 mg by mouth 3 (three) times daily.  Marland Kitchen acetaminophen (TYLENOL) 500 MG tablet Take 1,000 mg by mouth every 8 (eight) hours as needed  for mild pain.  Marland Kitchen amLODipine (NORVASC) 10 MG tablet Take 10 mg by mouth every evening.   . Dulaglutide (TRULICITY) 9.60 AV/4.0JW SOPN INJECT 1 SYRINGE INTO SKIN ONCE A WEEK  . gabapentin (NEURONTIN) 300 MG capsule Take 300 mg by mouth at bedtime as needed (cough).   Marland Kitchen levocetirizine (XYZAL) 5 MG tablet Take 5 mg by mouth every evening.  . metFORMIN (GLUCOPHAGE) 1000 MG tablet Take 1 tablet (1,000 mg total) by mouth 2 (two) times daily with a meal.  . mometasone (NASONEX) 50 MCG/ACT nasal spray Place 2 sprays into the nose daily as needed (congestion).   . montelukast (SINGULAIR) 10 MG tablet Take 10 mg by mouth at bedtime.  . Multiple Vitamin (MULTIVITAMIN WITH MINERALS) TABS tablet Take 1 tablet by mouth daily.  Marland Kitchen olmesartan-hydrochlorothiazide (BENICAR HCT) 40-12.5 MG tablet Take 1 tablet by mouth daily.  . Omega-3 Fatty Acids (FISH OIL) 1000 MG CAPS Take 1,000 mg by mouth 2  (two) times daily.   . [DISCONTINUED] atorvastatin (LIPITOR) 20 MG tablet Take 20 mg by mouth daily.  . [DISCONTINUED] Calcium Citrate-Vitamin D (CALCIUM + D PO) Take 1 tablet by mouth 3 (three) times daily.  . [DISCONTINUED] escitalopram (LEXAPRO) 10 MG tablet Take 10 mg by mouth daily as needed for anxiety.  . [DISCONTINUED] esomeprazole (NEXIUM) 20 MG capsule Take 20 mg by mouth 2 (two) times daily before a meal.  . [DISCONTINUED] ferrous sulfate 325 (65 FE) MG tablet Take 325 mg by mouth daily with breakfast.  . [DISCONTINUED] Fluticasone-Umeclidin-Vilant (TRELEGY ELLIPTA) 100-62.5-25 MCG/INH AEPB Inhale 1 puff into the lungs daily.  . [DISCONTINUED] metFORMIN (GLUCOPHAGE) 1000 MG tablet Take 1 tablet (1,000 mg total) by mouth 2 (two) times daily with a meal.  . [DISCONTINUED] TRULICITY 1.19 JY/7.8GN SOPN INJECT 1 SYRINGE INTO SKIN ONCE A WEEK   No facility-administered encounter medications on file as of 01/20/2018.    ALLERGIES: No Known Allergies VACCINATION STATUS:  There is no immunization history on file for this patient.  Diabetes  He presents for his follow-up diabetic visit. He has type 2 diabetes mellitus. Onset time: He was diagnosed at approximate age of 32 years. His disease course has been improving. There are no hypoglycemic associated symptoms. Pertinent negatives for hypoglycemia include no confusion, headaches, pallor or seizures. Associated symptoms include fatigue. Pertinent negatives for diabetes include no chest pain, no polydipsia, no polyphagia, no polyuria and no weakness. There are no hypoglycemic complications. Symptoms are improving. Diabetic complications include nephropathy. Risk factors for coronary artery disease include diabetes mellitus, dyslipidemia, hypertension, male sex, sedentary lifestyle and obesity. Current diabetic treatment includes insulin injections (He is on Lantus 78 units nightly and NovoLog 30-40 units 3 times a day before meals.). His weight  is stable (He is status post Roux-en-Y gastric bypass- he has lost 80 pounds overall.). He is following a generally unhealthy and diabetic diet. When asked about meal planning, he reported none. He has not had a previous visit with a dietitian. He participates in exercise intermittently (He participates in golfing activities.). An ACE inhibitor/angiotensin II receptor blocker is being taken. Eye exam is current.  Hyperlipidemia  This is a chronic problem. The current episode started more than 1 year ago. The problem is uncontrolled. Exacerbating diseases include chronic renal disease and diabetes. He has no history of obesity. Pertinent negatives include no chest pain, myalgias or shortness of breath. Current antihyperlipidemic treatment includes statins and bile acid squestrants. Compliance problems include adherence to diet.  Risk factors for  coronary artery disease include diabetes mellitus, dyslipidemia, hypertension, male sex, obesity and a sedentary lifestyle.  Hypertension  This is a chronic problem. The current episode started more than 1 year ago. The problem is uncontrolled. Pertinent negatives include no chest pain, headaches, neck pain, palpitations or shortness of breath. Risk factors for coronary artery disease include diabetes mellitus, dyslipidemia and sedentary lifestyle. Past treatments include angiotensin blockers. Hypertensive end-organ damage includes kidney disease. Identifiable causes of hypertension include chronic renal disease.     Review of Systems  Constitutional: Positive for fatigue. Negative for chills, fever and unexpected weight change.  HENT: Negative for dental problem, mouth sores and trouble swallowing.   Eyes: Negative for visual disturbance.  Respiratory: Negative for cough, choking, chest tightness, shortness of breath and wheezing.   Cardiovascular: Negative for chest pain, palpitations and leg swelling.  Gastrointestinal: Negative for abdominal distention,  abdominal pain, constipation, diarrhea, nausea and vomiting.  Endocrine: Negative for polydipsia, polyphagia and polyuria.  Genitourinary: Negative for dysuria, flank pain, hematuria and urgency.  Musculoskeletal: Negative for back pain, gait problem, myalgias and neck pain.  Skin: Negative for pallor, rash and wound.  Neurological: Negative for seizures, syncope, weakness, numbness and headaches.  Psychiatric/Behavioral: Negative for confusion and dysphoric mood.    Objective:    BP 131/89   Pulse 65   Ht 5\' 11"  (1.803 m)   Wt 188 lb (85.3 kg)   BMI 26.22 kg/m   Wt Readings from Last 3 Encounters:  01/20/18 188 lb (85.3 kg)  09/19/17 187 lb (84.8 kg)  05/20/17 189 lb (85.7 kg)    Physical Exam  Constitutional: He is oriented to person, place, and time. He appears well-developed and well-nourished. He is cooperative. No distress.  HENT:  Head: Normocephalic and atraumatic.  Eyes: EOM are normal.  Neck: Normal range of motion. Neck supple. No tracheal deviation present. No thyromegaly present.  Cardiovascular: Normal rate, S1 normal and S2 normal. Exam reveals no gallop.  No murmur heard. Pulses:      Dorsalis pedis pulses are 1+ on the right side and 1+ on the left side.       Posterior tibial pulses are 1+ on the right side and 1+ on the left side.  Pulmonary/Chest: Effort normal. No respiratory distress. He has no wheezes.  Abdominal: He exhibits no distension. There is no abdominal tenderness. There is no guarding and no CVA tenderness.  Musculoskeletal:        General: No edema.     Right shoulder: He exhibits no swelling and no deformity.  Neurological: He is alert and oriented to person, place, and time. He has normal strength and normal reflexes. No cranial nerve deficit or sensory deficit. Gait normal.  Skin: Skin is warm and dry. No rash noted. No cyanosis. Nails show no clubbing.  Psychiatric: He has a normal mood and affect. His speech is normal and behavior is  normal. Judgment and thought content normal. Cognition and memory are normal.   Recent Results (from the past 2160 hour(s))  CBC with Differential/Platelet     Status: None   Collection Time: 01/13/18  8:29 AM  Result Value Ref Range   WBC 7.9 3.4 - 10.8 x10E3/uL   RBC 4.66 4.14 - 5.80 x10E6/uL   Hemoglobin 13.8 13.0 - 17.7 g/dL   Hematocrit 41.4 37.5 - 51.0 %   MCV 89 79 - 97 fL   MCH 29.6 26.6 - 33.0 pg   MCHC 33.3 31.5 - 35.7 g/dL  RDW 12.6 12.3 - 15.4 %    Comment: **Effective January 27, 2018, the RDW pediatric reference**   interval will be removed and the adult reference interval   will be changing to:                             Male 11.7 - 15.4                                                      Male 11.6 - 15.4    Platelets 210 150 - 450 x10E3/uL   Neutrophils 61 Not Estab. %   Lymphs 30 Not Estab. %   Monocytes 5 Not Estab. %   Eos 3 Not Estab. %   Basos 1 Not Estab. %   Neutrophils Absolute 4.8 1.4 - 7.0 x10E3/uL   Lymphocytes Absolute 2.3 0.7 - 3.1 x10E3/uL   Monocytes Absolute 0.4 0.1 - 0.9 x10E3/uL   EOS (ABSOLUTE) 0.3 0.0 - 0.4 x10E3/uL   Basophils Absolute 0.1 0.0 - 0.2 x10E3/uL   Immature Granulocytes 0 Not Estab. %   Immature Grans (Abs) 0.0 0.0 - 0.1 x10E3/uL  Comprehensive metabolic panel     Status: Abnormal   Collection Time: 01/13/18  8:29 AM  Result Value Ref Range   Glucose 114 (H) 65 - 99 mg/dL   BUN 16 8 - 27 mg/dL   Creatinine, Ser 1.15 0.76 - 1.27 mg/dL   GFR calc non Af Amer 65 >59 mL/min/1.73   GFR calc Af Amer 76 >59 mL/min/1.73   BUN/Creatinine Ratio 14 10 - 24   Sodium 145 (H) 134 - 144 mmol/L   Potassium 4.2 3.5 - 5.2 mmol/L   Chloride 104 96 - 106 mmol/L   CO2 24 20 - 29 mmol/L   Calcium 9.4 8.6 - 10.2 mg/dL   Total Protein 6.2 6.0 - 8.5 g/dL   Albumin 4.2 3.6 - 4.8 g/dL   Globulin, Total 2.0 1.5 - 4.5 g/dL   Albumin/Globulin Ratio 2.1 1.2 - 2.2   Bilirubin Total 1.0 0.0 - 1.2 mg/dL   Alkaline Phosphatase 73 39 - 117 IU/L    AST 46 (H) 0 - 40 IU/L   ALT 54 (H) 0 - 44 IU/L  TSH+Free T4     Status: None (Preliminary result)   Collection Time: 01/13/18  8:29 AM  Result Value Ref Range   TSH WILL FOLLOW    Free T4 by Dialysis WILL FOLLOW   Hgb A1c w/o eAG     Status: Abnormal   Collection Time: 01/13/18  8:29 AM  Result Value Ref Range   Hgb A1c MFr Bld 7.2 (H) 4.8 - 5.6 %    Comment:          Prediabetes: 5.7 - 6.4          Diabetes: >6.4          Glycemic control for adults with diabetes: <7.0   Specimen status report     Status: None   Collection Time: 01/13/18  8:29 AM  Result Value Ref Range   specimen status report Comment     Comment: Isac Caddy CMP14 Default Ambig Abbrev CMP14 Default A hand-written panel/profile was received from your office. In accordance with the [facility] Ambiguous Test Code Policy dated July  2003, we have completed your order by using the closest currently or formerly recognized AMA panel.  We have assigned Comprehensive Metabolic Panel (14), Test Code #322000 to this request.  If this is not the testing you wished to receive on this specimen, please contact the [facility] Client Inquiry/Technical Services Department to clarify the test order.  We appreciate your business.    Lipid Panel     Component Value Date/Time   CHOL 161 05/15/2017   TRIG 89 05/15/2017   HDL 47 05/15/2017   CHOLHDL 3.9 04/25/2016 1119   VLDL 27 04/25/2016 1119   LDLCALC 96 05/15/2017     Assessment & Plan:   1. Uncontrolled type 2 diabetes mellitus  - Patient has currently uncontrolled symptomatic type 2 DM since  67 years of age. -Status post Roux-en-Y gastric bypass, lost 80 pounds overall.   -His recent A1c is proved to 7.2% from 8.7%.  - His diabetes is complicated by mild  CKD  and patient remains at a high risk for more acute and chronic complications of diabetes which include CAD, CVA, CKD, retinopathy, and neuropathy. These are all discussed in detail with the patient.  - I have  counseled the patient on diet management and weight loss, by adopting a carbohydrate restricted/protein rich diet.  -  Suggestion is made for him to avoid simple carbohydrates  from his diet including Cakes, Sweet Desserts / Pastries, Ice Cream, Soda (diet and regular), Sweet Tea, Candies, Chips, Cookies, Store Bought Juices, Alcohol in Excess of  1-2 drinks a day, Artificial Sweeteners, and "Sugar-free" Products. This will help patient to have stable blood glucose profile and potentially avoid unintended weight gain.  - I encouraged the patient to switch to  unprocessed or minimally processed complex starch and increased protein intake (animal or plant source), fruits, and vegetables.  - Patient is advised to stick to a routine mealtimes to eat 3 meals  a day and avoid unnecessary snacks ( to snack only to correct hypoglycemia).   - I have approached patient with the following individualized plan to manage diabetes and patient agrees:   -His labs are consistent with normal renal function, slightly abnormal transaminases.  He reports that he has been using large doses of Tylenol related to his cervical disc prolapse.  He is advised to alternate Tylenol with Aleve for pain control.  -He is advised to continue metformin 1000 mg p.o. twice daily with proper hydration.   -He will continue Trulicity  1.61 mg subcutaneously weekly, to advance as tolerated.    -He is advised to monitor blood glucose fasting 3 times a week and report if blood glucose readings are greater than 200 mg/day .  - Patient specific target  A1c;  LDL, HDL, Triglycerides, and  Waist Circumference were discussed in detail.  2) BP/HTN: His blood pressure is controlled to target.    He is advised to continue on olmesartan/hydrochlorothiazide .  3) Lipids/HPL: Recent lipid panel showed uncontrolled LDL of 96.  He is advised to continue atorvastatin 20 mg p.o. nightly.    4)  Weight/Diet: Status post Roux-en-Y gastric bypass, lost  80 pounds overall. CDE Consult in progress , exercise, and detailed carbohydrates information provided.  5) Chronic Care/Health Maintenance:  -Patient is on ACEI/ARB and Statin medications and encouraged to continue to follow up with Ophthalmology, Podiatrist at least yearly or according to recommendations, and advised to   stay away from smoking. I have recommended yearly flu vaccine and pneumonia vaccination at least  every 5 years; moderate intensity exercise for up to 150 minutes weekly; and  sleep for at least 7 hours a day.  -Given his unexplained fatigue, he may need measurement of his total testosterone on fasting in the a.m. hours.  He plans to get this during his next visit with his PMD.  - I advised patient to maintain close follow up with Allen [name], Allen Inch, Allen [name] for primary care needs.  - Time spent with the patient: 25 min, of which >50% was spent in reviewing his  current and  previous labs, previous treatments, and medications doses and developing a plan for long-term care.  [place] participated in the discussions, expressed understanding, and voiced agreement with the above plans.  All questions were answered to his satisfaction. he is encouraged to contact clinic should he have any questions or concerns prior to his return visit.   Follow up plan: In 6 months with labs. Allen Lloyd, Allen [name] Phone: [phone]  Fax: [phone]  -  This note was partially dictated with voice recognition software. Similar sounding words can be transcribed inadequately or may not  be corrected upon review.  01/20/2018, 3:56 PM

## 2018-01-20 NOTE — Patient Instructions (Signed)

## 2018-02-26 ENCOUNTER — Other Ambulatory Visit: Payer: Self-pay | Admitting: "Endocrinology

## 2018-03-21 IMAGING — CT CT ABD-PELV W/ CM
3 of 5 series · 13 of 42 positions shown, 19 images · IV contrast (iopamidol)
Comparison: None.

CLINICAL DATA: Nausea and epigastric pain after eating for the past
3-4 weeks. History of gastric bypass.

EXAM:
CT ABDOMEN AND PELVIS WITH CONTRAST
TECHNIQUE: Multidetector CT imaging of the abdomen and pelvis was performed
using the standard protocol following bolus administration of
intravenous contrast.
CONTRAST:  100mL [id number] IOPAMIDOL ([id number]) INJECTION 61%

[Series 3: abd/pelvis with · axial · 0.84mm/px · z∈[-398,-68]mm · 7 of 88 slices shown, 12 images]
[im 11/88  soft-tissue]
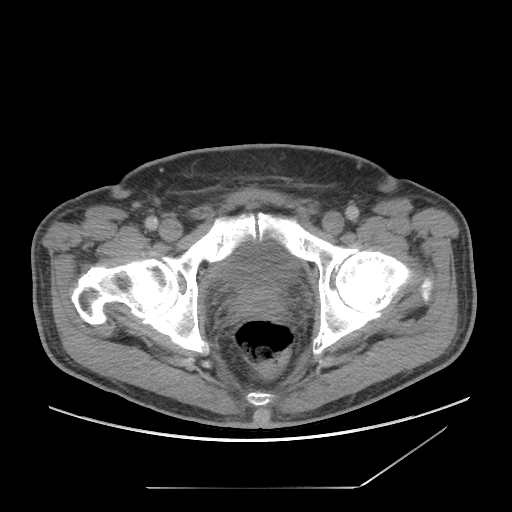
[im 11/88  bone]
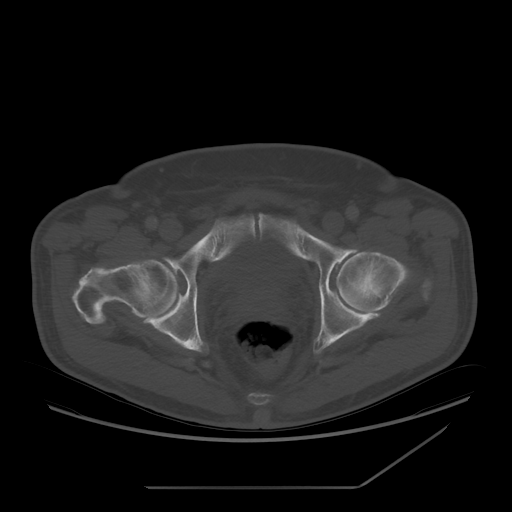
[im 22/88  soft-tissue]
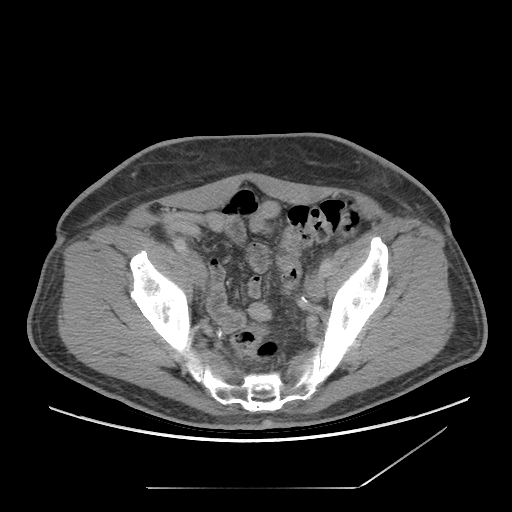
[im 33/88  soft-tissue]
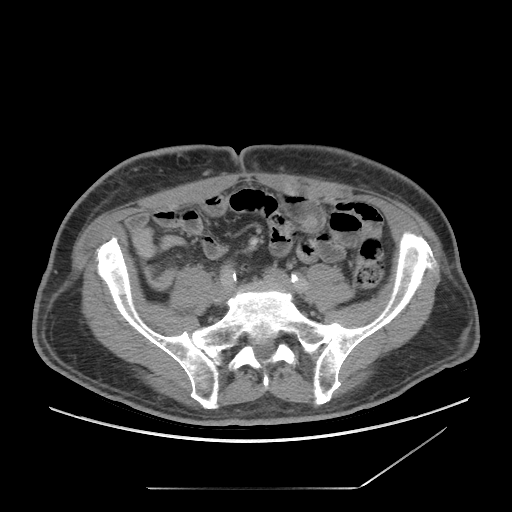
[im 44/88  soft-tissue]
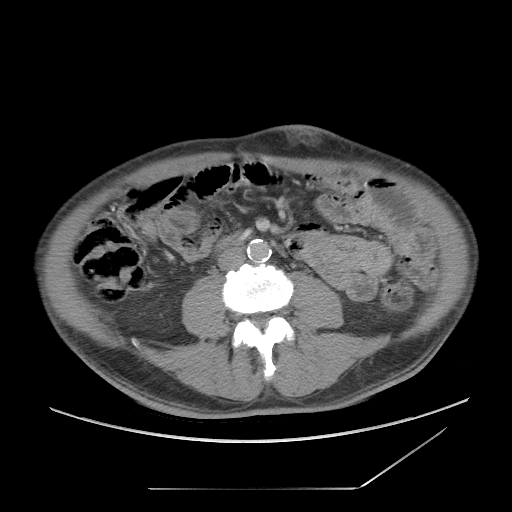
[im 44/88  lung]
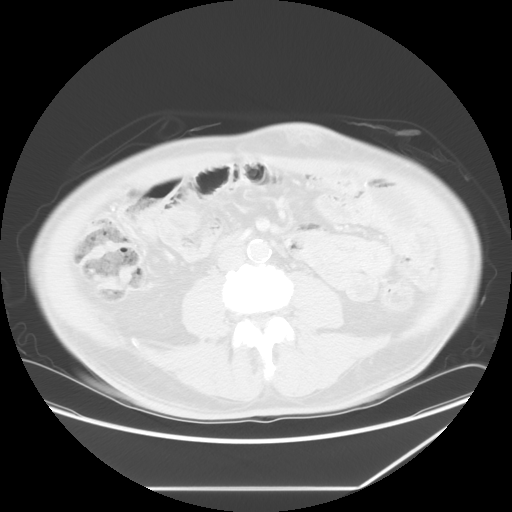
[im 55/88  soft-tissue]
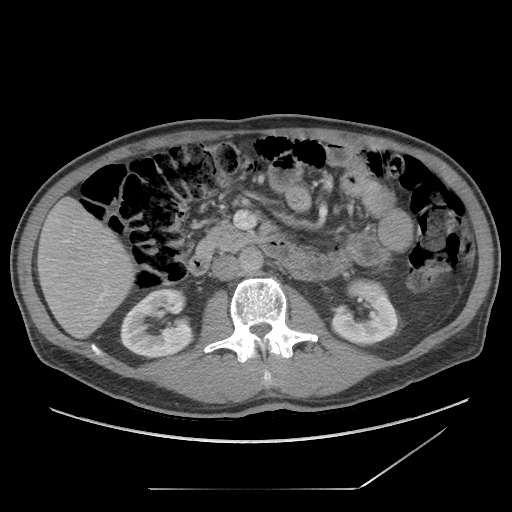
[im 55/88  lung]
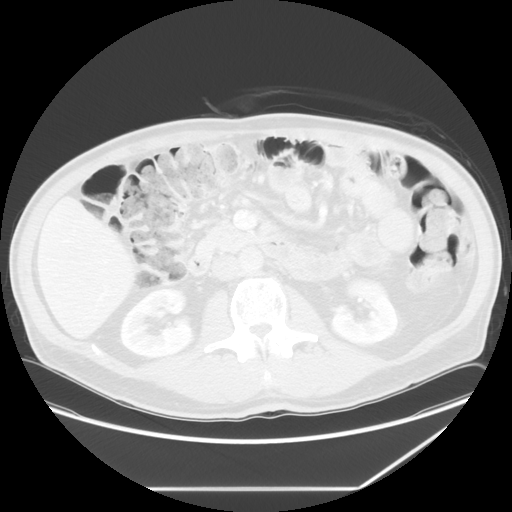
[im 66/88  soft-tissue]
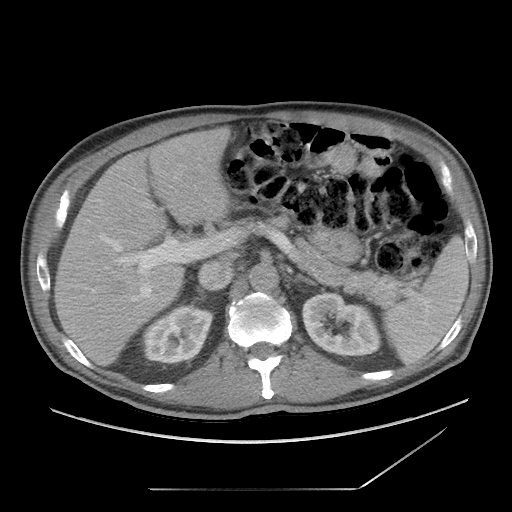
[im 66/88  lung]
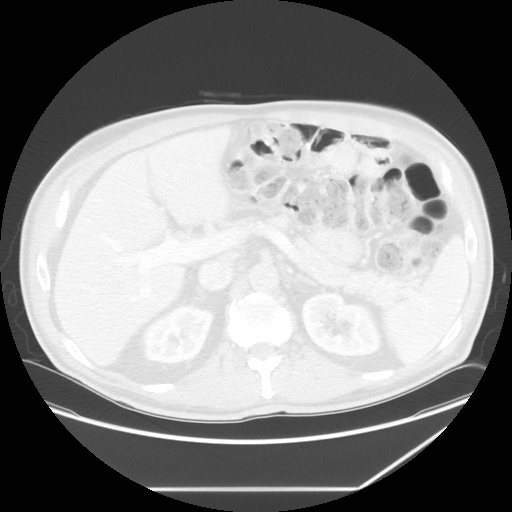
[im 77/88  soft-tissue]
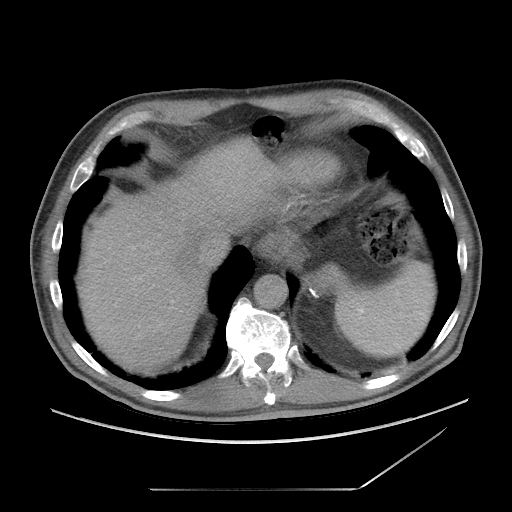
[im 77/88  lung]
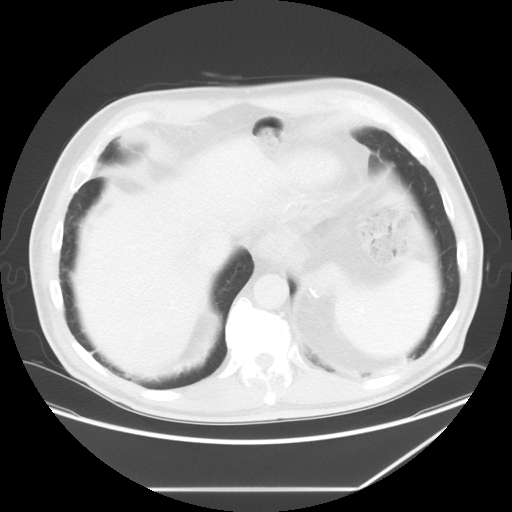

[Series 200: processed images · sagittal · 0.87mm/px · 3 of 157 slices shown (1 of 2)]
[im 10/157  soft-tissue]
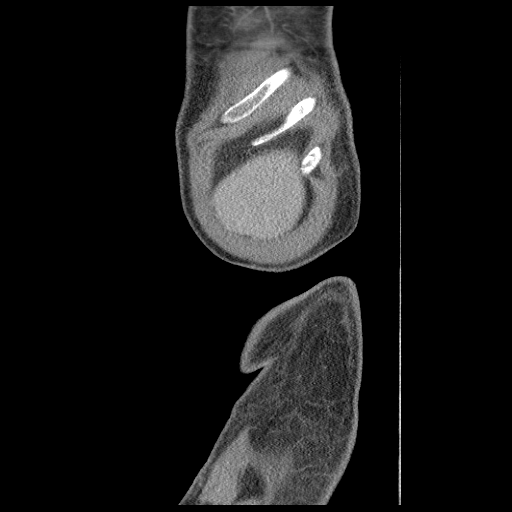
[im 30/157  soft-tissue]
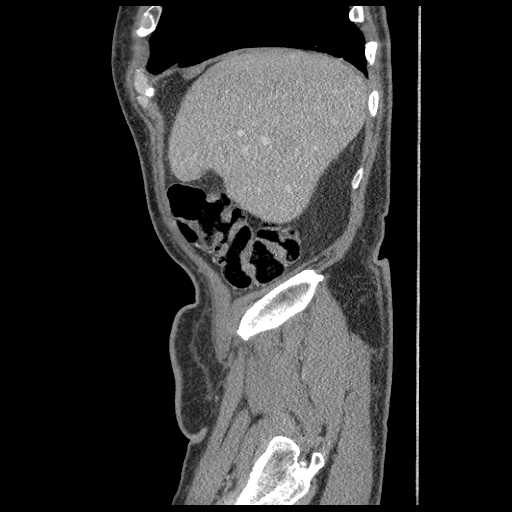
[im 49/157  soft-tissue]
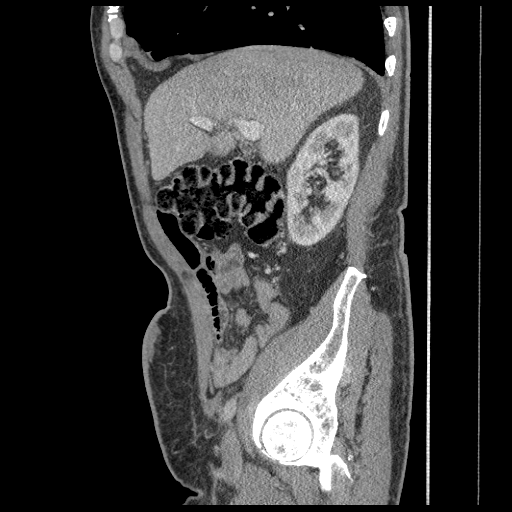

[Series 201: processed images · coronal · 0.87mm/px · 3 of 127 slices shown, 4 images (2 of 2)]
[im 43/127  soft-tissue]
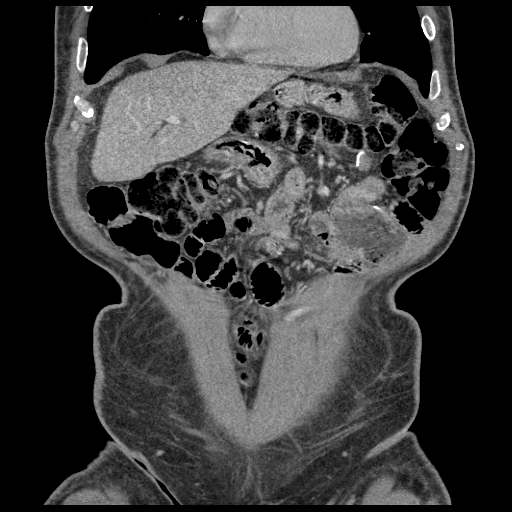
[im 57/127  soft-tissue]
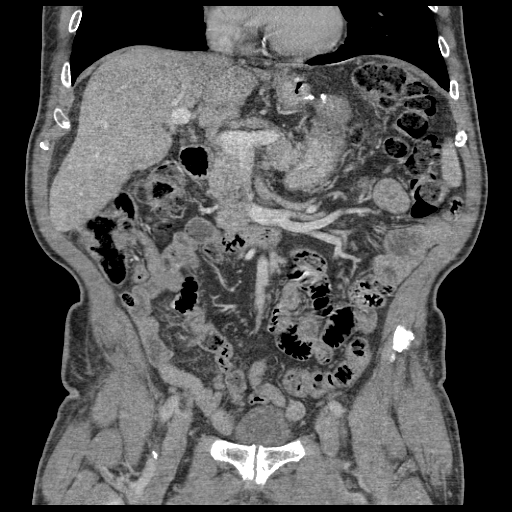
[im 57/127  bone]
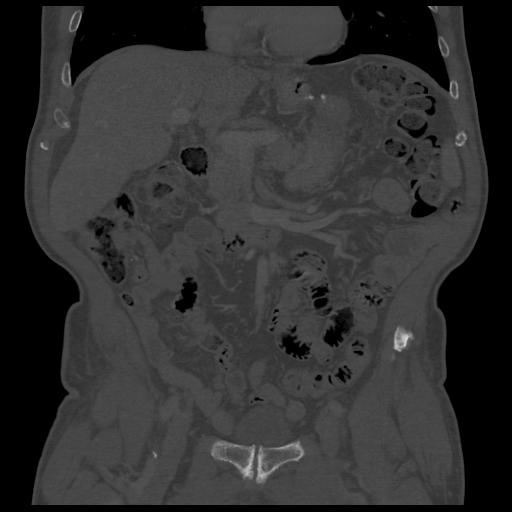
[im 71/127  soft-tissue]
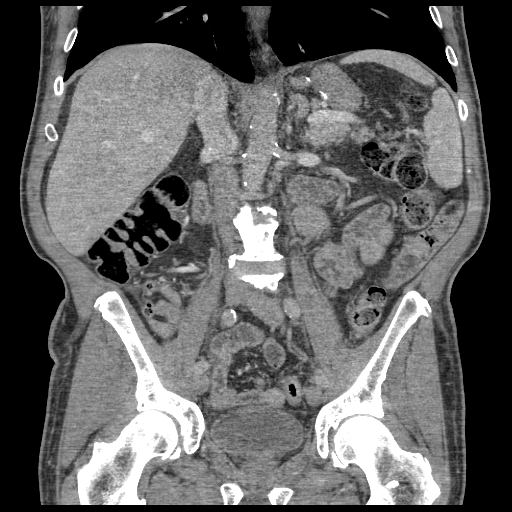

[13 of 42 positions shown; findings below may reference images not displayed]

FINDINGS: Lower chest: No acute abnormality.  Scarring at the lung bases.

Hepatobiliary: No focal liver abnormality is seen. Status post
cholecystectomy. No biliary dilatation.

Pancreas: Unremarkable. No pancreatic ductal dilatation or
surrounding inflammatory changes.

Spleen: Normal in size without focal abnormality.

Adrenals/Urinary Tract: Adrenal glands are unremarkable. Kidneys are
normal, without renal calculi, focal lesion, or hydronephrosis.
Bladder is unremarkable.

Stomach/Bowel: Postsurgical changes related to prior gastric bypass.
The appendix is borderline dilated with a small appendicolith near
the tip, but is filled with stool and air without surrounding
inflammatory changes. No bowel obstruction. Large colonic stool
burden.

Vascular/Lymphatic: Aortic atherosclerosis. No enlarged abdominal or
pelvic lymph nodes.

Reproductive: The prostate is mildly enlarged.

Other: No free fluid or pneumoperitoneum.

Musculoskeletal: No acute or significant osseous findings.
Degenerative changes of the lumbar spine. There are a few focal
areas of subcutaneous soft tissue stranding just deep to the skin in
the lower anterior abdominal wall, possibly injection related.
IMPRESSION: 1. No acute intra-abdominal process. Postsurgical changes related to
prior gastric bypass surgery. No bowel obstruction.
2. Large colonic stool burden.  Correlate for constipation.
3.  Aortic atherosclerosis ([id number]).

## 2018-05-28 ENCOUNTER — Other Ambulatory Visit: Payer: Self-pay | Admitting: "Endocrinology

## 2018-06-29 ENCOUNTER — Other Ambulatory Visit: Payer: Self-pay | Admitting: "Endocrinology

## 2018-07-21 LAB — HEMOGLOBIN A1C: Hemoglobin A1C: 6.7

## 2018-07-21 LAB — BASIC METABOLIC PANEL
BUN: 19 (ref 4–21)
Creatinine: 1.4 — AB (ref 0.6–1.3)

## 2018-07-21 LAB — LIPID PANEL
Cholesterol: 163 (ref 0–200)
HDL: 53 (ref 35–70)
LDL Cholesterol: 89
Triglycerides: 111 (ref 40–160)

## 2018-08-06 ENCOUNTER — Other Ambulatory Visit: Payer: Self-pay

## 2018-08-06 ENCOUNTER — Encounter (INDEPENDENT_AMBULATORY_CARE_PROVIDER_SITE_OTHER): Payer: Self-pay

## 2018-08-06 ENCOUNTER — Encounter: Payer: Self-pay | Admitting: "Endocrinology

## 2018-08-06 ENCOUNTER — Ambulatory Visit (INDEPENDENT_AMBULATORY_CARE_PROVIDER_SITE_OTHER): Payer: Medicare Other | Admitting: "Endocrinology

## 2018-08-06 VITALS — BP 140/82 | HR 87 | Ht 71.0 in

## 2018-08-06 DIAGNOSIS — I10 Essential (primary) hypertension: Secondary | ICD-10-CM | POA: Diagnosis not present

## 2018-08-06 DIAGNOSIS — E1165 Type 2 diabetes mellitus with hyperglycemia: Secondary | ICD-10-CM

## 2018-08-06 DIAGNOSIS — E782 Mixed hyperlipidemia: Secondary | ICD-10-CM

## 2018-08-06 MED ORDER — METFORMIN HCL 500 MG PO TABS: 1000.0000 mg | ORAL_TABLET | Freq: Two times a day (BID) | ORAL | 3 refills | Status: DC

## 2018-08-06 MED ORDER — ONDANSETRON HCL 4 MG PO TABS: 4.0000 mg | ORAL_TABLET | Freq: Three times a day (TID) | ORAL | 3 refills | Status: DC | PRN

## 2018-08-06 NOTE — Progress Notes (Signed)
Endocrinology follow-up note   Subjective:    Patient ID: Allen [name], male    DOB: 1950-10-23. Patient is being seen in f/u for management of diabetes requested by  Vasireddy, Lanetta Inch, MD  Past Medical History:  Diagnosis Date  . Anemia    on iron since gastric bypass  . Arthritis    mild  . Asthma   . Cancer (HCC)    hx of skin cancer  . Chronic kidney disease    stage 3, now resolved since weight loss  . Claustrophobia   . Diabetes mellitus, type II ([place])    type 2 metformin  . GERD (gastroesophageal reflux disease)    takes nexium  . Hyperlipidemia   . Hypertension   . Pneumonia 15 yrs ago  . Sleep apnea    cpap, no cpap since may 2018 weight loss 80 labs    Past Surgical History:  Procedure Laterality Date  . carpal tunnel Bilateral   . CERVICAL VERTEBRAE EXCISION     and cleaning  . CHOLECYSTECTOMY    . cyst removed left hand  01-28-17  . ESOPHAGOGASTRODUODENOSCOPY (EGD) WITH PROPOFOL N/A 01/31/2017   Procedure: ESOPHAGOGASTRODUODENOSCOPY (EGD) WITH PROPOFOL;  Surgeon: Alphonsa Overall, MD;  Location: [facility];  Service: General;  Laterality: N/A;  . GASTRIC ROUX-EN-Y N/A 06/04/2016   Procedure: LAPAROSCOPIC ROUX-EN-Y GASTRIC BYPASS WITH UPPER ENDOSCOPY;  Surgeon: Clovis Riley, MD;  Location: [facility];  Service: General;  Laterality: N/A;  . NASAL SINUS SURGERY    . TRIGGER FINGER RELEASE     Social History   Socioeconomic History  . Marital status: Married    Spouse name: Not on file  . Number of children: Not on file  . Years of education: Not on file  . Highest education level: Not on file  Occupational History  . Not on file  Social Needs  . Financial resource strain: Not on file  . Food insecurity    Worry: Not on file    Inability: Not on file  . Transportation needs    Medical: Not on file    Non-medical: Not on file  Tobacco Use  . Smoking status: Never Smoker  . Smokeless tobacco: Never Used  Substance and Sexual  Activity  . Alcohol use: No  . Drug use: No  . Sexual activity: Yes  Lifestyle  . Physical activity    Days per week: Not on file    Minutes per session: Not on file  . Stress: Not on file  Relationships  . Social Herbalist on phone: Not on file    Gets together: Not on file    Attends religious service: Not on file    Active member of club or organization: Not on file    Attends meetings of clubs or organizations: Not on file    Relationship status: Not on file  Other Topics Concern  . Not on file  Social History Narrative  . Not on file   Outpatient Encounter Medications as of 08/06/2018  Medication Sig  . ondansetron (ZOFRAN) 4 MG tablet Take 1 tablet (4 mg total) by mouth every 8 (eight) hours as needed.  . [DISCONTINUED] ondansetron (ZOFRAN) 4 MG tablet Take 4 mg by mouth every 8 (eight) hours as needed.  Marland Kitchen acetaminophen (TYLENOL) 500 MG tablet Take 1,000 mg by mouth every 8 (eight) hours as needed for mild pain.  Marland Kitchen amLODipine (NORVASC) 10 MG tablet Take 10 mg by mouth  every evening.   . Calcium Carb-Cholecalciferol (CALCIUM 500+D3 PO) Take by mouth 3 (three) times daily.  . Cyanocobalamin (VITAMIN B-12 PO) Take 300 mcg by mouth daily.  Marland Kitchen gabapentin (NEURONTIN) 300 MG capsule Take 300 mg by mouth at bedtime as needed (cough).   Marland Kitchen levocetirizine (XYZAL) 5 MG tablet Take 5 mg by mouth every evening.  . Magnesium 400 MG CAPS Take by mouth 2 (two) times daily.  . metFORMIN (GLUCOPHAGE) 500 MG tablet Take 2 tablets (1,000 mg total) by mouth 2 (two) times daily with a meal.  . metoCLOPramide (REGLAN) 5 MG tablet Take 5 mg by mouth 3 (three) times daily.  . mometasone (NASONEX) 50 MCG/ACT nasal spray Place 2 sprays into the nose daily as needed (congestion).   . montelukast (SINGULAIR) 10 MG tablet Take 10 mg by mouth at bedtime.  . Multiple Vitamin (MULTIVITAMIN WITH MINERALS) TABS tablet Take 1 tablet by mouth daily.  Marland Kitchen olmesartan-hydrochlorothiazide (BENICAR HCT)  40-12.5 MG tablet Take 1 tablet by mouth daily.  . Omega-3 Fatty Acids (FISH OIL) 1000 MG CAPS Take 1,000 mg by mouth 2 (two) times daily.   . TRULICITY 8.08 UP/1.0RP SOPN INJECT 1 SYRINGE INTO SKIN ONCE A WEEK  . [DISCONTINUED] metFORMIN (GLUCOPHAGE) 1000 MG tablet TAKE 1 TABLET BY MOUTH TWICE A DAY WITH MEALS   No facility-administered encounter medications on file as of 08/06/2018.    ALLERGIES: No Known Allergies VACCINATION STATUS:  There is no immunization history on file for this patient.  Diabetes He presents for his follow-up diabetic visit. He has type 2 diabetes mellitus. Onset time: He was diagnosed at approximate age of 68 years. His disease course has been improving. There are no hypoglycemic associated symptoms. Pertinent negatives for hypoglycemia include no confusion, headaches, pallor or seizures. Associated symptoms include fatigue. Pertinent negatives for diabetes include no chest pain, no polydipsia, no polyphagia, no polyuria and no weakness. There are no hypoglycemic complications. Symptoms are improving. Diabetic complications include nephropathy. Risk factors for coronary artery disease include diabetes mellitus, dyslipidemia, hypertension, male sex, sedentary lifestyle and obesity. Current diabetic treatment includes insulin injections (He is on Lantus 78 units nightly and NovoLog 30-40 units 3 times a day before meals.). His weight is decreasing steadily (He is status post Roux-en-Y gastric bypass- he has lost pprox 100 kbs overall.). He is following a diabetic diet. When asked about meal planning, he reported none. He has not had a previous visit with a dietitian. He participates in exercise intermittently (He participates in golfing activities.). An ACE inhibitor/angiotensin II receptor blocker is being taken. Eye exam is current.  Hyperlipidemia This is a chronic problem. The current episode started more than 1 year ago. The problem is uncontrolled. Exacerbating diseases  include chronic renal disease and diabetes. He has no history of obesity. Pertinent negatives include no chest pain, myalgias or shortness of breath. Current antihyperlipidemic treatment includes statins and bile acid squestrants. Compliance problems include adherence to diet.  Risk factors for coronary artery disease include diabetes mellitus, dyslipidemia, hypertension, male sex, obesity and a sedentary lifestyle.  Hypertension This is a chronic problem. The current episode started more than 1 year ago. The problem is uncontrolled. Pertinent negatives include no chest pain, headaches, neck pain, palpitations or shortness of breath. Risk factors for coronary artery disease include diabetes mellitus, dyslipidemia and sedentary lifestyle. Past treatments include angiotensin blockers. Hypertensive end-organ damage includes kidney disease. Identifiable causes of hypertension include chronic renal disease.     Review of Systems  Constitutional: Positive for fatigue. Negative for chills, fever and unexpected weight change.  HENT: Negative for dental problem, mouth sores and trouble swallowing.   Eyes: Negative for visual disturbance.  Respiratory: Negative for cough, choking, chest tightness, shortness of breath and wheezing.   Cardiovascular: Negative for chest pain, palpitations and leg swelling.  Gastrointestinal: Negative for abdominal distention, abdominal pain, constipation, diarrhea, nausea and vomiting.  Endocrine: Negative for polydipsia, polyphagia and polyuria.  Genitourinary: Negative for dysuria, flank pain, hematuria and urgency.  Musculoskeletal: Negative for back pain, gait problem, myalgias and neck pain.  Skin: Negative for pallor, rash and wound.  Neurological: Negative for seizures, syncope, weakness, numbness and headaches.  Psychiatric/Behavioral: Negative for confusion and dysphoric mood.    Objective:    BP 140/82   Pulse 87   Ht 5\' 11"  (1.803 m)   BMI 26.22 kg/m   Wt  Readings from Last 3 Encounters:  01/20/18 188 lb (85.3 kg)  09/19/17 187 lb (84.8 kg)  05/20/17 189 lb (85.7 kg)    Physical Exam  Constitutional: He is oriented to person, place, and time. He appears well-developed and well-nourished. He is cooperative. No distress.  HENT:  Head: Normocephalic and atraumatic.  Eyes: EOM are normal.  Neck: Normal range of motion. Neck supple. No tracheal deviation present. No thyromegaly present.  Cardiovascular: Normal rate, S1 normal and S2 normal. Exam reveals no gallop.  No murmur heard. Pulses:      Dorsalis pedis pulses are 1+ on the right side and 1+ on the left side.       Posterior tibial pulses are 1+ on the right side and 1+ on the left side.  Pulmonary/Chest: Effort normal. No respiratory distress. He has no wheezes.  Abdominal: He exhibits no distension. There is no abdominal tenderness. There is no guarding and no CVA tenderness.  Musculoskeletal:        General: No edema.     Right shoulder: He exhibits no swelling and no deformity.  Neurological: He is alert and oriented to person, place, and time. He has normal strength and normal reflexes. No cranial nerve deficit or sensory deficit. Gait normal.  Skin: Skin is warm and dry. No rash noted. No cyanosis. Nails show no clubbing.  Psychiatric: He has a normal mood and affect. His speech is normal and behavior is normal. Judgment and thought content normal. Cognition and memory are normal.   Recent Results (from the past 2160 hour(s))  Basic metabolic panel     Status: Abnormal   Collection Time: 07/21/18 12:00 AM  Result Value Ref Range   BUN 19 4 - 21   Creatinine 1.4 (A) 0.6 - 1.3  Lipid panel     Status: None   Collection Time: 07/21/18 12:00 AM  Result Value Ref Range   Triglycerides 111 40 - 160   Cholesterol 163 0 - 200   HDL 53 35 - 70   LDL Cholesterol 89   Hemoglobin A1c     Status: None   Collection Time: 07/21/18 12:00 AM  Result Value Ref Range   Hemoglobin A1C 6.7     Lipid Panel     Component Value Date/Time   CHOL 163 07/21/2018   TRIG 111 07/21/2018   HDL 53 07/21/2018   CHOLHDL 3.9 04/25/2016 1119   VLDL 27 04/25/2016 1119   LDLCALC 89 07/21/2018     Assessment & Plan:   1.  type 2 diabetes mellitus  - Patient has currently controlled asymptomatic type  2 DM since  68 years of age. -Status post Roux-en-Y gastric bypass, lost approximately 80 pounds due to his surgery.   Since his last visit, he underwent neck surgery which caused more unintended loss of 20 pounds.  -His previsit labs show A1c of 6.7%, improving from 8 point function. - His diabetes is complicated by mild  CKD  and patient remains at a high risk for more acute and chronic complications of diabetes which include CAD, CVA, CKD, retinopathy, and neuropathy. These are all discussed in detail with the patient.  - I have counseled the patient on diet management and weight loss, by adopting a carbohydrate restricted/protein rich diet.  - he  admits there is a room for improvement in his diet and drink choices. -  Suggestion is made for him to avoid simple carbohydrates  from his diet including Cakes, Sweet Desserts / Pastries, Ice Cream, Soda (diet and regular), Sweet Tea, Candies, Chips, Cookies, Sweet Pastries,  Store Bought Juices, Alcohol in Excess of  1-2 drinks a day, Artificial Sweeteners, Coffee Creamer, and "Sugar-free" Products. This will help patient to have stable blood glucose profile and potentially avoid unintended weight gain.   - I encouraged the patient to switch to  unprocessed or minimally processed complex starch and increased protein intake (animal or plant source), fruits, and vegetables.  - Patient is advised to stick to a routine mealtimes to eat 3 meals  a day and avoid unnecessary snacks ( to snack only to correct hypoglycemia).   - I have approached patient with the following individualized plan to manage diabetes and patient agrees:   -His labs are  consistent with mild elevation of his serum creatinine.  He is advised to lower her metformin to 500 mg p.o. twice daily with proper hydration.      -He will continue Trulicity  8.67 mg subcutaneously weekly, to advance as tolerated.    -He is advised to monitor blood glucose fasting 3 times a week and report if blood glucose readings are greater than 200 mg/day .  - Patient specific target  A1c;  LDL, HDL, Triglycerides, and  Waist Circumference were discussed in detail.  2) BP/HTN: His blood pressure is controlled to target.  He is advised to continue on olmesartan/hydrochlorothiazide .  3) Lipids/HPL: Recent lipid panel showed uncontrolled LDL of 96.  He is advised to continue atorvastatin 20 mg p.o. nightly.    4)  Weight/Diet: Status post Roux-en-Y gastric bypass, lost 100 pounds overall. CDE Consult in progress , exercise, and detailed carbohydrates information provided.  5) Chronic Care/Health Maintenance:  -Patient is on ACEI/ARB and Statin medications and encouraged to continue to follow up with Ophthalmology, Podiatrist at least yearly or according to recommendations, and advised to   stay away from smoking. I have recommended yearly flu vaccine and pneumonia vaccination at least every 5 years; moderate intensity exercise for up to 150 minutes weekly; and  sleep for at least 7 hours a day.  -He has requested a refill on his ondansetron given to him on Friday surgery due to nausea/vomiting.  I gave him refills for ondansetron 4 mg p.o.  PRN, not more than 3 times a day.     - I advised patient to maintain close follow up with Vasireddy, Lanetta Inch, MD for primary care needs.  - Patient Care Time Today:  25 min, of which >50% was spent in reviewing his  current and  previous labs/studies, previous treatments, and medications doses and developing a  plan for long-term care based on the latest recommendations for standards of care.  [place] participated in the  discussions, expressed understanding, and voiced agreement with the above plans.  All questions were answered to his satisfaction. he is encouraged to contact clinic should he have any questions or concerns prior to his return visit.   Follow up plan: In 6 months with labs. Glade Lloyd, MD Phone: [phone]  Fax: [phone]  -  This note was partially dictated with voice recognition software. Similar sounding words can be transcribed inadequately or may not  be corrected upon review.  08/06/2018, 1:17 PM

## 2018-08-06 NOTE — Patient Instructions (Signed)

## 2018-08-24 ENCOUNTER — Other Ambulatory Visit: Payer: Self-pay | Admitting: "Endocrinology

## 2018-08-25 ENCOUNTER — Other Ambulatory Visit: Payer: Self-pay | Admitting: "Endocrinology

## 2018-08-26 ENCOUNTER — Telehealth: Payer: Self-pay

## 2018-08-26 ENCOUNTER — Other Ambulatory Visit: Payer: Self-pay | Admitting: Neurosurgery

## 2018-08-26 DIAGNOSIS — R29898 Other symptoms and signs involving the musculoskeletal system: Secondary | ICD-10-CM

## 2018-08-26 NOTE — Telephone Encounter (Signed)
Reviewed patient's medications and drug allergies before being scheduled for a cervical myelogram.  He was informed he needs to hold Reglan and Escitalpram for 48 hours before and 24 hours after the procedure. He was informed he will be here 2 hours, will need a driver and will need to be on strict bedrest for 24 hours after the procedure.

## 2018-09-04 ENCOUNTER — Ambulatory Visit
Admission: RE | Admit: 2018-09-04 | Discharge: 2018-09-04 | Disposition: A | Discharge status: Home / Self Care | Payer: Medicare Other | Source: Ambulatory Visit | Attending: Neurosurgery | Admitting: Neurosurgery

## 2018-09-04 DIAGNOSIS — R29898 Other symptoms and signs involving the musculoskeletal system: Secondary | ICD-10-CM

## 2018-09-04 MED ORDER — IOPAMIDOL (ISOVUE-M 300) INJECTION 61%
10.0000 mL | Freq: Once | INTRAMUSCULAR | Status: AC | PRN
  Administered 2018-09-04: 11:00:00 10 mL via EPIDURAL

## 2018-09-04 MED ORDER — DIAZEPAM 5 MG PO TABS
5.0000 mg | ORAL_TABLET | Freq: Once | ORAL | Status: AC
  Administered 2018-09-04: 5 mg via ORAL

## 2018-09-04 NOTE — Discharge Instructions (Signed)
Myelogram Discharge Instructions  1. Go home and rest quietly for the next 24 hours.  It is important to lie flat for the next 24 hours.  Get up only to go to the restroom.  You may lie in the bed or on a couch on your back, your stomach, your left side or your right side.  You may have one pillow under your head.  You may have pillows between your knees while you are on your side or under your knees while you are on your back.  2. DO NOT drive today.  Recline the seat as far back as it will go, while still wearing your seat belt, on the way home.  3. You may get up to go to the bathroom as needed.  You may sit up for 10 minutes to eat.  You may resume your normal diet and medications unless otherwise indicated.  Drink lots of extra fluids today and tomorrow.  4. The incidence of headache, nausea, or vomiting is about 5% (one in 20 patients).  If you develop a headache, lie flat and drink plenty of fluids until the headache goes away.  Caffeinated beverages may be helpful.  If you develop severe nausea and vomiting or a headache that does not go away with flat bed rest, call [phone].  5. You may resume normal activities after your 24 hours of bed rest is over; however, do not exert yourself strongly or do any heavy lifting tomorrow. If when you get up you have a headache when standing, go back to bed and force fluids for another 24 hours.  6. Call your physician for a follow-up appointment.  The results of your myelogram will be sent directly to your physician by the following day.  7. If you have any questions or if complications develop after you arrive home, please call [phone].  Discharge instructions have been explained to the patient.  The patient, or the person responsible for the patient, fully understands these instructions.  YOU MAY RESTART YOUR LEXAPRO AND REGLAN TOMORROW 09/05/2018 AT 10:30AM.

## 2018-09-10 ENCOUNTER — Other Ambulatory Visit: Payer: Self-pay | Admitting: Neurosurgery

## 2018-10-06 NOTE — Progress Notes (Signed)
[id number] [address] [place] [address] Phone: [phone] Fax: [phone]    Your procedure is scheduled on Friday, September 18th.  Report to [facility] Main Entrance "A" at 10:10 A.M., and check in at the Admitting office.  Call this number if you have problems the morning of surgery:  [phone]  Call [phone] if you have any questions prior to your surgery date Monday-Friday 8am-4pm   Remember:  Do not eat or drink after midnight the night before your surgery   Take these medicines the morning of surgery with A SIP OF WATER  metoCLOPramide (REGLAN)  If needed- acetaminophen (TYLENOL), mometasone (NASONEX)/nasal spray, ondansetron (ZOFRAN)   7 days prior to surgery STOP taking any Aspirin (unless otherwise instructed by your surgeon), Aleve, Naproxen, Ibuprofen, Motrin, Advil, Goody's, BC's, all herbal medications, fish oil, and all vitamins.  How to Manage Your Diabetes  WHAT DO I DO ABOUT MY DIABETES MEDICATION?  Marland Kitchen Do not take metFORMIN (GLUCOPHAGE)/oral diabetes medicines (pills) the morning of surgery.  . The day of surgery, do not take other diabetes injectables, including Byetta (exenatide), Bydureon (exenatide ER), Victoza (liraglutide), or Trulicity (dulaglutide).  Before and After Surgery  Why is it important to control my blood sugar before and after surgery? . Improving blood sugar levels before and after surgery helps healing and can limit problems. . A way of improving blood sugar control is eating a healthy diet by: o  Eating less sugar and carbohydrates o  Increasing activity/exercise o  Talking with your doctor about reaching your blood sugar goals . High blood sugars (greater than 180 mg/dL) can raise your risk of infections and slow your recovery, so you will need to focus on controlling your diabetes during the weeks before surgery. . Make sure that the doctor who  takes care of your diabetes knows about your planned surgery including the date and location.  How do I manage my blood sugar before surgery? . Check your blood sugar at least 4 times a day, starting 2 days before surgery, to make sure that the level is not too high or low. o Check your blood sugar the morning of your surgery when you wake up and every 2 hours until you get to the Short Stay unit. . If your blood sugar is less than 70 mg/dL, you will need to treat for low blood sugar: o Do not take insulin. o Treat a low blood sugar (less than 70 mg/dL) with  cup of clear juice (cranberry or apple), 4 glucose tablets, OR glucose gel. Recheck blood sugar in 15 minutes after treatment (to make sure it is greater than 70 mg/dL). If your blood sugar is not greater than 70 mg/dL on recheck, call [phone] o  for further instructions. . Report your blood sugar to the short stay nurse when you get to Short Stay.  . If you are admitted to the hospital after surgery: o Your blood sugar will be checked by the staff and you will probably be given insulin after surgery (instead of oral diabetes medicines) to make sure you have good blood sugar levels. o The goal for blood sugar control after surgery is 80-180 mg/dL.  Reviewed and Endorsed by [facility] Patient Education Committee, August 2015  The Morning of Surgery  Do not wear jewelry, make-up or nail polish.  Do not wear lotions, powders, or perfumes/colognes, or deodorant  Do not shave 48  hours prior to surgery.  Men may shave face and neck.  Do not bring valuables to the hospital.  [facility] is not responsible for any belongings or valuables.  If you are a smoker, DO NOT Smoke 24 hours prior to surgery IF you wear a CPAP at night please bring your mask, tubing, and machine the morning of surgery   Remember that you must have someone to transport you home after your surgery, and remain with you for 24 hours if you are discharged the same  day.  Contacts, glasses, hearing aids, dentures or bridgework may not be worn into surgery.   Leave your suitcase in the car.  After surgery it may be brought to your room.  For patients admitted to the hospital, discharge time will be determined by your treatment team.  Patients discharged the day of surgery will not be allowed to drive home.   Special instructions:   [place]- Preparing For Surgery  Before surgery, you can play an important role. Because skin is not sterile, your skin needs to be as free of germs as possible. You can reduce the number of germs on your skin by washing with CHG (chlorahexidine gluconate) Soap before surgery.  CHG is an antiseptic cleaner which kills germs and bonds with the skin to continue killing germs even after washing.    Oral Hygiene is also important to reduce your risk of infection.  Remember - BRUSH YOUR TEETH THE MORNING OF SURGERY WITH YOUR REGULAR TOOTHPASTE  Please do not use if you have an allergy to CHG or antibacterial soaps. If your skin becomes reddened/irritated stop using the CHG.  Do not shave (including legs and underarms) for at least 48 hours prior to first CHG shower. It is OK to shave your face.  Please follow these instructions carefully.   1. Shower the NIGHT BEFORE SURGERY and the MORNING OF SURGERY with CHG Soap.   2. If you chose to wash your hair, wash your hair first as usual with your normal shampoo.  3. After you shampoo, rinse your hair and body thoroughly to remove the shampoo.  4. Use CHG as you would any other liquid soap. You can apply CHG directly to the skin and wash gently with a scrungie or a clean washcloth.   5. Apply the CHG Soap to your body ONLY FROM THE NECK DOWN.  Do not use on open wounds or open sores. Avoid contact with your eyes, ears, mouth and genitals (private parts). Wash Face and genitals (private parts)  with your normal soap.   6. Wash thoroughly, paying special attention to the area  where your surgery will be performed.  7. Thoroughly rinse your body with warm water from the neck down.  8. DO NOT shower/wash with your normal soap after using and rinsing off the CHG Soap.  9. Pat yourself dry with a CLEAN TOWEL.  10. Wear CLEAN PAJAMAS to bed the night before surgery, wear comfortable clothes the morning of surgery  11. Place CLEAN SHEETS on your bed the night of your first shower and DO NOT SLEEP WITH PETS.  Day of Surgery: Do not apply any deodorants/lotions. Please shower the morning of surgery with the CHG soap  Please wear clean clothes to the hospital/surgery center.   Remember to brush your teeth WITH YOUR REGULAR TOOTHPASTE.  Please read over the following fact sheets that you were given.

## 2018-10-07 ENCOUNTER — Encounter (HOSPITAL_COMMUNITY)
Admission: RE | Admit: 2018-10-07 | Discharge: 2018-10-07 | Disposition: A | Discharge status: Home / Self Care | Payer: Medicare Other | Source: Ambulatory Visit | Attending: Neurosurgery | Admitting: Neurosurgery

## 2018-10-07 ENCOUNTER — Encounter (HOSPITAL_COMMUNITY): Payer: Self-pay

## 2018-10-07 ENCOUNTER — Other Ambulatory Visit (HOSPITAL_COMMUNITY)
Admission: RE | Admit: 2018-10-07 | Discharge: 2018-10-07 | Disposition: A | Discharge status: Home / Self Care | Payer: Medicare Other | Source: Ambulatory Visit | Attending: Neurosurgery | Admitting: Neurosurgery

## 2018-10-07 ENCOUNTER — Other Ambulatory Visit: Payer: Self-pay

## 2018-10-07 DIAGNOSIS — Z20828 Contact with and (suspected) exposure to other viral communicable diseases: Secondary | ICD-10-CM | POA: Insufficient documentation

## 2018-10-07 DIAGNOSIS — Z01818 Encounter for other preprocedural examination: Secondary | ICD-10-CM | POA: Insufficient documentation

## 2018-10-07 LAB — BASIC METABOLIC PANEL
Anion gap: 8 (ref 5–15)
BUN: 16 mg/dL (ref 8–23)
CO2: 28 mmol/L (ref 22–32)
Calcium: 9 mg/dL (ref 8.9–10.3)
Chloride: 104 mmol/L (ref 98–111)
Creatinine, Ser: 1.52 mg/dL — ABNORMAL HIGH (ref 0.61–1.24)
GFR calc Af Amer: 54 mL/min — ABNORMAL LOW (ref >60)
GFR calc non Af Amer: 46 mL/min — ABNORMAL LOW (ref >60)
Glucose, Bld: 174 mg/dL — ABNORMAL HIGH (ref 70–99)
Potassium: 4 mmol/L (ref 3.5–5.1)
Sodium: 140 mmol/L (ref 135–145)

## 2018-10-07 LAB — GLUCOSE, CAPILLARY: Glucose-Capillary: 182 mg/dL — ABNORMAL HIGH (ref 70–99)

## 2018-10-07 LAB — CBC
HCT: 41.1 % (ref 39.0–52.0)
Hemoglobin: 13.4 g/dL (ref 13.0–17.0)
MCH: 29.4 pg (ref 26.0–34.0)
MCHC: 32.6 g/dL (ref 30.0–36.0)
MCV: 90.1 fL (ref 80.0–100.0)
Platelets: 172 10*3/uL (ref 150–400)
RBC: 4.56 MIL/uL (ref 4.22–5.81)
RDW: 12.3 % (ref 11.5–15.5)
WBC: 6.1 10*3/uL (ref 4.0–10.5)
nRBC: 0 % (ref 0.0–0.2)

## 2018-10-07 LAB — TYPE AND SCREEN
ABO/RH(D): O POS
Antibody Screen: NEGATIVE

## 2018-10-07 LAB — SURGICAL PCR SCREEN
MRSA, PCR: NEGATIVE
Staphylococcus aureus: POSITIVE — AB

## 2018-10-07 LAB — ABO/RH: ABO/RH(D): O POS

## 2018-10-07 NOTE — H&P (Signed)
Patient ID:   [id number] Patient: Allen [name]  Date of Birth: [birth year] Visit Type: Office Visit   Date: 09/09/2018 02:00 PM Provider: Marchia Meiers. Vertell Limber MD   This 68 year old male presents for neck pain.  HISTORY OF PRESENT ILLNESS:  1.  neck pain  I reviewed the patient's imaging in detail with him and his wife.  He continues to have significant pain at 7/10 and continues to have weakness in his left deltoid.  His myelogram demonstrates that he has pseudoarthrosis at both C3-4 and C4-5 level.  It appears that the graft was is within the vertebra at C5 and is not within the disc space.  There does not appear to open significant decompression of the neural foramen at the C4-5 level on the left.  The previously operated C5-6 and C6-7 levels appear to have healed and there is no nerve compression at those levels.  There is also foraminal narrowing at the C3-4 level on the left.  I explained that this is a difficult situation as he has significant weakness in his left arm and and malposition and graft at the C4-5 level but without adequate decompression of the nerve root.  I told him the as the risk of worsening weakness and also that it may be required to remove the C5 vertebra to adequately decompress the nerve root although I would hope to not need to do so.  He will need to have the hardware removed and revised and also to have revision surgery of the C3-4 and C4-5 levels with possible corpectomy of the C5 vertebra.  He wishes to do so.  And we have scheduled this for 10/10/2018.         Medical/Surgical/Interim History Reviewed, no change.     PAST MEDICAL HISTORY, SURGICAL HISTORY, FAMILY HISTORY, SOCIAL HISTORY AND REVIEW OF SYSTEMS I have reviewed the patient's past medical, surgical, family and social history as well as the comprehensive review of systems as included on the [facility] history form dated 08/16/2018, which I have signed.  Family  History:  Reviewed, no changes.    Social History: Reviewed, no changes.   MEDICATIONS: (added, continued or stopped this visit) Started Medication Directions Instruction Stopped   amlodipine 10 mg tablet take 1 tablet by oral route  every day     ASMANEX inhale 2 puff by inhalation route  every day in the evening     Calcium 500  500 mg calcium (1,250 mg) tablet      levocetirizine 5 mg tablet take 1 tablet by oral route  every day in the evening     magnesium 400 mg (as magnesium oxide) capsule      metformin 1,000 mg tablet take 1 tablet by oral route 2 times every day with morning and evening meals     metoclopramide 5 mg tablet take 1 tablet by oral route 3 times every day 30 minutes before meals and at bedtime     multivitamin      olmesartan 40 mg tablet take 1 tablet by oral route  every day     Trulicity [id number] [id number] mL subcutaneous pen injector inject 0.5 milliliter by subcutaneous route  every week in the abdomen, thigh, or upper arm rotating injection sites     Vitamin D3  ORAL 800units       ALLERGIES: Ingredient Reaction Medication Name Comment  NO KNOWN ALLERGIES     No known allergies.    PHYSICAL EXAM:  Vitals Date Temp F BP Pulse Ht In Wt Lb BMI BSA Pain Score  09/09/2018 98.3 149/80 62 71 183 25.52  7/10      IMPRESSION:   Proceed with revision surgery C3-4 C4-5 levels.  PLAN:  This has been scheduled for 10/10/2018.  Patient is aware of risks and benefits and wishes to proceed with surgery.  Orders: Diagnostic Procedures: Assessment Procedure  M54.12 Cervical Spine- Lateral  Instruction(s)/Education: Assessment Instruction  R03.0 Hypertension education  Z68.25 Lifestyle education regarding diet   Completed Orders (this encounter) Order Details Reason Side Interpretation Result Initial Treatment Date Region  Hypertension education Continue to monitor blood pressure, if blood pressure remains elevated contact primary doctor        Lifestyle  education regarding diet Patient encouraged to eat a well balance diet         Assessment/Plan   # Detail Type Description   1. Assessment Stenosis of cervical spine with myelopathy (M48.02).       2. Assessment Myelopathy in diseases classified elsewhere (G99.2).       3. Assessment Failed cervical fusion (M96.0).       4. Assessment Pseudoarthrosis of cervical spine, initial encounter (S12.9XXA).       5. Assessment Herniated nucleus pulposus, C4-5 (M50.221).       6. Assessment Radiculopathy, cervical region (M54.12).       7. Assessment Body mass index (BMI) 25.0-25.9, adult [id number]).   Plan Orders Today's instructions / counseling include(s) Lifestyle education regarding diet. Clinical information/comments: Patient encouraged to eat a well balance diet.       8. Assessment Elevated blood-pressure reading, w/o diagnosis of htn (R03.0).         Pain Management Plan Pain Scale: 7/10. Method: Numeric Pain Intensity Scale. Location: back. Onset: 03/03/2018. Duration: varies. Quality: discomforting. Pain management follow-up plan of care: Patient taking medication as prescribed.  Fall Risk Plan The patient has not fallen in the last year.              Provider:  Marchia Meiers. Vertell Limber MD  09/14/2018 05:38 PM    Dictation edited by: Marchia Meiers. Vertell Limber    CC Providers: Erline Levine MD  [address] [place], [place] [address]               Electronically signed by Marchia Meiers. Vertell Limber MD on 09/14/2018 05:38 PM

## 2018-10-07 NOTE — Progress Notes (Addendum)
PCP - Alford Highland, MD Cardiologist - Dr. Vivien Rota  Chest x-ray - pt denies- EKG - 10/07/2018 at PAT appt  Stress Test -  01/10/2015 in EPIC ECHO - 01/10/2015 in EPIC  Cardiac Cath - over 25 year ago  Sleep Study - Yes- 2018 in EPIC CPAP - No  Fasting Blood Sugar - unsure Checks Blood Sugar varies times a day- not every day  Blood Thinner Instructions: pt denies Aspirin Instructions: pt denies  Anesthesia review: Yes-EKG-pending A1C results  Patient denies shortness of breath, fever, cough and chest pain at PAT appointment  Patient verbalized understanding of instructions that were given to them at the PAT appointment. Patient was also instructed that they will need to review over the PAT instructions again at home before surgery.   Coronavirus Screening  Have you experienced the following symptoms:  Cough yes/no: No Fever (>100.56F)  yes/no: No Runny nose yes/no: No Sore throat yes/no: No Difficulty breathing/shortness of breath  yes/no: No  Have you or a family member traveled in the last 14 days and where? yes/no: No   If the patient indicates "YES" to the above questions, their PAT will be rescheduled to limit the exposure to others and, the surgeon will be notified. THE PATIENT WILL NEED TO BE ASYMPTOMATIC FOR 14 DAYS.   If the patient is not experiencing any of these symptoms, the PAT nurse will instruct them to NOT bring anyone with them to their appointment since they may have these symptoms or traveled as well.   Please remind your patients and families that hospital visitation restrictions are in effect and the importance of the restrictions.

## 2018-10-08 LAB — HEMOGLOBIN A1C
Hgb A1c MFr Bld: 6.6 % — ABNORMAL HIGH (ref 4.8–5.6)
Mean Plasma Glucose: 143 mg/dL

## 2018-10-08 LAB — NOVEL CORONAVIRUS, NAA (HOSP ORDER, SEND-OUT TO REF LAB; TAT 18-24 HRS): SARS-CoV-2, NAA: NOT DETECTED

## 2018-10-08 NOTE — Progress Notes (Signed)
Anesthesia Chart Review:  Case: [id number] Date/Time: 10/10/18 1159   Procedure: Right sided revision with hardware removal at Cervical 3-4, Cervical 4-5 Anterior cervical decompression/discectomy/fusion with possible corpectomy at Cervical 5 (Right ) - Right sided revision with hardware removal at Cervical 3-4, Cervical 4-5 Anterior cervical decompression/discectomy/fusion with possible corpectomy at Cervical 5   Anesthesia type: General   Pre-op diagnosis: Stenosis of cervical spine with myelopathy   Location: [facility] OR ROOM 21 / [place] OR   Surgeon: Erline Levine, MD      DISCUSSION: Patient is a 68 year old male scheduled for the above procedure.   History includes never smoker, HTN, HLD, CKD, GERD, DM2, anemia, asthma, skin cancer, claustrophobia, OSA (no CPAP since weight loss), Gastric Roux-en-Y (06/04/16), nasal sinus surgery, neck surgeries.   Preoperative labs show a Cr 1.52. Known CKD. He has had a variable Creatinine in review of labs dating back to 12/28/14 (some of which are scanned under Media tab and others in [facility]). Cr 1.55 12/28/14 and ranged from 1.51-1.87 from 03/2015-05/2016 and 1.15-1.20 in 2019, but 1.36-1.40 in 06/2018. A1c 6.6%. CBC WNL. T&S done.   His EKG appears stable since at least 12/2014 and had a normal stress echo that same month. He denied chest pain, SOB, cough, fever at PAT RN visit.    10/07/18 COVID-19 test negative. If no acute changes then I anticipate that he can proceed as planned.   VS: BP (!) 160/75   Pulse 64   Temp 36.6 C   Resp 20   Ht 5\' 11"  (1.803 m)   Wt 83.3 kg   SpO2 100%   BMI 25.62 kg/m    PROVIDERS: Vasireddy, Lanetta Inch, MD is PCP [facility]) Loni Beckwith, MD is endocrinologist Cecilie Lowers, MD is cardiologist ([facility]). He saw patient for HTN and chest pain in 2016-2017 and had a normal stress echo. (Records scanned under Media tab.)   LABS: Preoperative labs noted. See DISCUSSION. Cr 1.55  back 12/28/14. 1.19 05/24/17  1.36 07/21/18.  (all labs ordered are listed, but only abnormal results are displayed)  Labs Reviewed  SURGICAL PCR SCREEN - Abnormal; Notable for the following components:      Result Value   Staphylococcus aureus POSITIVE (*)    All other components within normal limits  GLUCOSE, CAPILLARY - Abnormal; Notable for the following components:   Glucose-Capillary 182 (*)    All other components within normal limits  BASIC METABOLIC PANEL - Abnormal; Notable for the following components:   Glucose, Bld 174 (*)    Creatinine, Ser 1.52 (*)    GFR calc non Af Amer 46 (*)    GFR calc Af Amer 54 (*)    All other components within normal limits  HEMOGLOBIN A1C - Abnormal; Notable for the following components:   Hgb A1c MFr Bld 6.6 (*)    All other components within normal limits  CBC  TYPE AND SCREEN  ABO/RH     IMAGES: CT C-spine 09/04/18: IMPRESSION: 1. Prior C3-C5 ACDF with unchanged hardware failure. Loosening of the screws with anterior displacement of the interbody grafts and prominent subsidence of the C4-C5 graft. No significant interbody fusion at either level. Unchanged residual severe left neuroforaminal stenosis at both levels. 2. No dynamic instability. 3. Solid interbody fusion at C5-C6 and C6-C7 with unchanged prominent central osseous ridging at C6-C7 resulting in mild spinal canal stenosis and flattening of the ventral cord.    EKG: 10/07/18: Normal sinus rhythm Right  bundle branch block Inferior infarct , age undetermined Abnormal ECG No significant change since last tracing [04/25/2016 and 12/24/2014] Confirmed by Ena Dawley [phone]) on 10/07/2018 12:47:46 PM   CV: Stress echo 12/31/14 ([facility]; Scanned under Media tab, Encounter date 05/24/15) Normal stress echo.   He thinks he may have had a cardiac cath over 25 years ago. No reported CAD history.    Past Medical History:  Diagnosis Date  . Anemia     on iron since gastric bypass  . Arthritis    mild  . Asthma   . Cancer (HCC)    hx of skin cancer  . Chronic kidney disease    stage 3, now resolved since weight loss  . Claustrophobia   . Diabetes mellitus, type II ([place])    type 2 metformin  . GERD (gastroesophageal reflux disease)    takes nexium  . Hyperlipidemia   . Hypertension   . Pneumonia 15 yrs ago  . Sleep apnea    cpap, no cpap since may 2018 weight loss 80 labs     Past Surgical History:  Procedure Laterality Date  . CARDIAC CATHETERIZATION     patient reports he thinks he had one over 25 years ago  . carpal tunnel Bilateral   . CERVICAL VERTEBRAE EXCISION     and cleaning  . CHOLECYSTECTOMY    . cyst removed left hand  01-28-17  . ESOPHAGOGASTRODUODENOSCOPY (EGD) WITH PROPOFOL N/A 01/31/2017   Procedure: ESOPHAGOGASTRODUODENOSCOPY (EGD) WITH PROPOFOL;  Surgeon: Alphonsa Overall, MD;  Location: [facility];  Service: General;  Laterality: N/A;  . GASTRIC ROUX-EN-Y N/A 06/04/2016   Procedure: LAPAROSCOPIC ROUX-EN-Y GASTRIC BYPASS WITH UPPER ENDOSCOPY;  Surgeon: Clovis Riley, MD;  Location: [facility];  Service: General;  Laterality: N/A;  . NASAL SINUS SURGERY    . TRIGGER FINGER RELEASE      MEDICATIONS: . acetaminophen (TYLENOL) 500 MG tablet  . amLODipine (NORVASC) 10 MG tablet  . Calcium Carb-Cholecalciferol (CALCIUM 500+D3 PO)  . Cyanocobalamin (VITAMIN B-12 PO)  . gabapentin (NEURONTIN) 300 MG capsule  . levocetirizine (XYZAL) 5 MG tablet  . Magnesium 400 MG CAPS  . metFORMIN (GLUCOPHAGE) 1000 MG tablet  . metoCLOPramide (REGLAN) 5 MG tablet  . mometasone (NASONEX) 50 MCG/ACT nasal spray  . Multiple Vitamin (MULTIVITAMIN WITH MINERALS) TABS tablet  . ondansetron (ZOFRAN) 4 MG tablet  . TRULICITY A999333 0000000 SOPN   No current facility-administered medications for this encounter.     Myra Gianotti, PA-C Surgical Short Stay/Anesthesiology [facility] Phone [phone] [facility] Phone [phone] 10/08/2018 9:09 PM

## 2018-10-08 NOTE — Anesthesia Preprocedure Evaluation (Deleted)
Anesthesia Evaluation    Airway        Dental   Pulmonary           Cardiovascular hypertension,      Neuro/Psych    GI/Hepatic   Endo/Other  diabetes  Renal/GU      Musculoskeletal   Abdominal   Peds  Hematology   Anesthesia Other Findings   Reproductive/Obstetrics                             Anesthesia Physical Anesthesia Plan  ASA:   Anesthesia Plan:    Post-op Pain Management:    Induction:   PONV Risk Score and Plan:   Airway Management Planned:   Additional Equipment:   Intra-op Plan:   Post-operative Plan:   Informed Consent:   Plan Discussed with:   Anesthesia Plan Comments: (PAT note written 10/08/2018 by Myra Gianotti, PA-C. )        Anesthesia Quick Evaluation

## 2018-10-10 ENCOUNTER — Inpatient Hospital Stay (HOSPITAL_COMMUNITY)
Admission: RE | Admit: 2018-10-10 | Discharge: 2018-10-11 | DRG: 472 | Disposition: A | Discharge status: Home / Self Care | Payer: Medicare Other | Attending: Neurosurgery | Admitting: Neurosurgery

## 2018-10-10 ENCOUNTER — Encounter (HOSPITAL_COMMUNITY): Payer: Self-pay | Admitting: *Deleted

## 2018-10-10 ENCOUNTER — Inpatient Hospital Stay (HOSPITAL_COMMUNITY): Payer: Medicare Other | Admitting: Anesthesiology

## 2018-10-10 ENCOUNTER — Other Ambulatory Visit: Payer: Self-pay

## 2018-10-10 ENCOUNTER — Encounter (HOSPITAL_COMMUNITY)
Admission: RE | Disposition: A | Discharge status: Home / Self Care | Payer: Self-pay | Source: Home / Self Care | Attending: Neurosurgery

## 2018-10-10 ENCOUNTER — Inpatient Hospital Stay (HOSPITAL_COMMUNITY): Payer: Medicare Other | Admitting: Vascular Surgery

## 2018-10-10 ENCOUNTER — Inpatient Hospital Stay (HOSPITAL_COMMUNITY): Payer: Medicare Other

## 2018-10-10 DIAGNOSIS — G992 Myelopathy in diseases classified elsewhere: Secondary | ICD-10-CM | POA: Diagnosis present

## 2018-10-10 DIAGNOSIS — Z20828 Contact with and (suspected) exposure to other viral communicable diseases: Secondary | ICD-10-CM | POA: Diagnosis present

## 2018-10-10 DIAGNOSIS — Y792 Prosthetic and other implants, materials and accessory orthopedic devices associated with adverse incidents: Secondary | ICD-10-CM | POA: Diagnosis present

## 2018-10-10 DIAGNOSIS — M960 Pseudarthrosis after fusion or arthrodesis: Principal | ICD-10-CM | POA: Diagnosis present

## 2018-10-10 DIAGNOSIS — M50121 Cervical disc disorder at C4-C5 level with radiculopathy: Secondary | ICD-10-CM | POA: Diagnosis present

## 2018-10-10 DIAGNOSIS — Z794 Long term (current) use of insulin: Secondary | ICD-10-CM

## 2018-10-10 DIAGNOSIS — M5020 Other cervical disc displacement, unspecified cervical region: Secondary | ICD-10-CM | POA: Diagnosis present

## 2018-10-10 DIAGNOSIS — Z79899 Other long term (current) drug therapy: Secondary | ICD-10-CM | POA: Diagnosis not present

## 2018-10-10 DIAGNOSIS — K219 Gastro-esophageal reflux disease without esophagitis: Secondary | ICD-10-CM | POA: Diagnosis present

## 2018-10-10 DIAGNOSIS — M4802 Spinal stenosis, cervical region: Secondary | ICD-10-CM | POA: Diagnosis present

## 2018-10-10 DIAGNOSIS — I10 Essential (primary) hypertension: Secondary | ICD-10-CM | POA: Diagnosis present

## 2018-10-10 DIAGNOSIS — Z419 Encounter for procedure for purposes other than remedying health state, unspecified: Secondary | ICD-10-CM

## 2018-10-10 DIAGNOSIS — M542 Cervicalgia: Secondary | ICD-10-CM | POA: Diagnosis present

## 2018-10-10 HISTORY — PX: ANTERIOR CERVICAL DECOMP/DISCECTOMY FUSION: SHX1161

## 2018-10-10 LAB — GLUCOSE, CAPILLARY
Glucose-Capillary: 107 mg/dL — ABNORMAL HIGH (ref 70–99)
Glucose-Capillary: 99 mg/dL (ref 70–99)
Glucose-Capillary: 127 mg/dL — ABNORMAL HIGH (ref 70–99)
Glucose-Capillary: 195 mg/dL — ABNORMAL HIGH (ref 70–99)
Glucose-Capillary: 218 mg/dL — ABNORMAL HIGH (ref 70–99)

## 2018-10-10 SURGERY — ANTERIOR CERVICAL DECOMPRESSION/DISCECTOMY FUSION 2 LEVEL/HARDWARE REMOVAL
Anesthesia: General | Site: Neck | Laterality: Right

## 2018-10-10 MED ORDER — CHLORHEXIDINE GLUCONATE CLOTH 2 % EX PADS: 6.0000 | MEDICATED_PAD | Freq: Once | CUTANEOUS | Status: DC

## 2018-10-10 MED ORDER — GABAPENTIN 300 MG PO CAPS: 300.0000 mg | ORAL_CAPSULE | Freq: Every evening | ORAL | Status: DC | PRN

## 2018-10-10 MED ORDER — HYDROCODONE-ACETAMINOPHEN 5-325 MG PO TABS: 1.0000 | ORAL_TABLET | ORAL | Status: DC | PRN

## 2018-10-10 MED ORDER — POLYETHYLENE GLYCOL 3350 17 G PO PACK: 17.0000 g | PACK | Freq: Every day | ORAL | Status: DC | PRN

## 2018-10-10 MED ORDER — FENTANYL CITRATE (PF) 100 MCG/2ML IJ SOLN
INTRAMUSCULAR | Status: DC | PRN
  Administered 2018-10-10: 100 ug via INTRAVENOUS

## 2018-10-10 MED ORDER — CALCIUM CARB-CHOLECALCIFEROL 500-400 MG-UNIT PO TABS: ORAL_TABLET | Freq: Three times a day (TID) | ORAL | Status: DC

## 2018-10-10 MED ORDER — DOCUSATE SODIUM 100 MG PO CAPS
100.0000 mg | ORAL_CAPSULE | Freq: Two times a day (BID) | ORAL | Status: DC
  Administered 2018-10-10: 22:00:00 100 mg via ORAL
  Filled 2018-10-10: qty 1

## 2018-10-10 MED ORDER — FENTANYL CITRATE (PF) 250 MCG/5ML IJ SOLN
INTRAMUSCULAR | Status: AC
  Filled 2018-10-10: qty 5

## 2018-10-10 MED ORDER — ACETAMINOPHEN 325 MG PO TABS
650.0000 mg | ORAL_TABLET | ORAL | Status: DC | PRN
  Administered 2018-10-10 – 2018-10-11 (×2): 650 mg via ORAL
  Filled 2018-10-10 (×2): qty 2

## 2018-10-10 MED ORDER — ALUM & MAG HYDROXIDE-SIMETH 200-200-20 MG/5ML PO SUSP: 30.0000 mL | Freq: Four times a day (QID) | ORAL | Status: DC | PRN

## 2018-10-10 MED ORDER — PROPOFOL 10 MG/ML IV BOLUS
INTRAVENOUS | Status: DC | PRN
  Administered 2018-10-10: 140 mg via INTRAVENOUS

## 2018-10-10 MED ORDER — PANTOPRAZOLE SODIUM 40 MG IV SOLR: 40.0000 mg | Freq: Every day | INTRAVENOUS | Status: DC

## 2018-10-10 MED ORDER — VITAMIN B-12 100 MCG PO TABS
300.0000 ug | ORAL_TABLET | Freq: Every day | ORAL | Status: DC
  Filled 2018-10-10: qty 3

## 2018-10-10 MED ORDER — METHOCARBAMOL 500 MG PO TABS
ORAL_TABLET | ORAL | Status: AC
  Administered 2018-10-10: 500 mg
  Filled 2018-10-10: qty 1

## 2018-10-10 MED ORDER — LIDOCAINE 2% (20 MG/ML) 5 ML SYRINGE
INTRAMUSCULAR | Status: DC | PRN
  Administered 2018-10-10: 100 mg via INTRAVENOUS

## 2018-10-10 MED ORDER — PROMETHAZINE HCL 25 MG/ML IJ SOLN: 6.2500 mg | INTRAMUSCULAR | Status: DC | PRN

## 2018-10-10 MED ORDER — SODIUM CHLORIDE 0.9% FLUSH: 3.0000 mL | INTRAVENOUS | Status: DC | PRN

## 2018-10-10 MED ORDER — MIDAZOLAM HCL 2 MG/2ML IJ SOLN
INTRAMUSCULAR | Status: AC
  Filled 2018-10-10: qty 2

## 2018-10-10 MED ORDER — ZOLPIDEM TARTRATE 5 MG PO TABS: 5.0000 mg | ORAL_TABLET | Freq: Every evening | ORAL | Status: DC | PRN

## 2018-10-10 MED ORDER — LIDOCAINE-EPINEPHRINE 1 %-1:100000 IJ SOLN
INTRAMUSCULAR | Status: AC
  Filled 2018-10-10: qty 1

## 2018-10-10 MED ORDER — FLEET ENEMA 7-19 GM/118ML RE ENEM: 1.0000 | ENEMA | Freq: Once | RECTAL | Status: DC | PRN

## 2018-10-10 MED ORDER — PROPOFOL 10 MG/ML IV BOLUS
INTRAVENOUS | Status: AC
  Filled 2018-10-10: qty 20

## 2018-10-10 MED ORDER — PHENOL 1.4 % MT LIQD: 1.0000 | OROMUCOSAL | Status: DC | PRN

## 2018-10-10 MED ORDER — CELECOXIB 200 MG PO CAPS
400.0000 mg | ORAL_CAPSULE | Freq: Once | ORAL | Status: AC
  Administered 2018-10-10: 400 mg via ORAL

## 2018-10-10 MED ORDER — FENTANYL CITRATE (PF) 100 MCG/2ML IJ SOLN
25.0000 ug | INTRAMUSCULAR | Status: DC | PRN
  Administered 2018-10-10: 25 ug via INTRAVENOUS

## 2018-10-10 MED ORDER — SODIUM CHLORIDE 0.9 % IV SOLN: 250.0000 mL | INTRAVENOUS | Status: DC

## 2018-10-10 MED ORDER — METHOCARBAMOL 500 MG PO TABS
500.0000 mg | ORAL_TABLET | Freq: Four times a day (QID) | ORAL | Status: DC | PRN
  Administered 2018-10-10: 18:00:00 500 mg via ORAL
  Filled 2018-10-10: qty 1

## 2018-10-10 MED ORDER — METOCLOPRAMIDE HCL 5 MG PO TABS
5.0000 mg | ORAL_TABLET | Freq: Three times a day (TID) | ORAL | Status: DC
  Administered 2018-10-10 (×2): 5 mg via ORAL
  Filled 2018-10-10 (×2): qty 1

## 2018-10-10 MED ORDER — CALCIUM CARBONATE-VITAMIN D 500-200 MG-UNIT PO TABS: 1.0000 | ORAL_TABLET | Freq: Three times a day (TID) | ORAL | Status: DC

## 2018-10-10 MED ORDER — LACTATED RINGERS IV SOLN
INTRAVENOUS | Status: DC
  Administered 2018-10-10 (×2): via INTRAVENOUS

## 2018-10-10 MED ORDER — ONDANSETRON HCL 4 MG PO TABS: 4.0000 mg | ORAL_TABLET | Freq: Three times a day (TID) | ORAL | Status: DC | PRN

## 2018-10-10 MED ORDER — METHOCARBAMOL 1000 MG/10ML IJ SOLN
500.0000 mg | Freq: Four times a day (QID) | INTRAVENOUS | Status: DC | PRN
  Filled 2018-10-10: qty 5

## 2018-10-10 MED ORDER — METFORMIN HCL 500 MG PO TABS
500.0000 mg | ORAL_TABLET | Freq: Two times a day (BID) | ORAL | Status: DC
  Administered 2018-10-11: 500 mg via ORAL
  Filled 2018-10-10: qty 1

## 2018-10-10 MED ORDER — FENTANYL CITRATE (PF) 100 MCG/2ML IJ SOLN
INTRAMUSCULAR | Status: AC
  Filled 2018-10-10: qty 2

## 2018-10-10 MED ORDER — ONDANSETRON HCL 4 MG/2ML IJ SOLN: 4.0000 mg | Freq: Four times a day (QID) | INTRAMUSCULAR | Status: DC | PRN

## 2018-10-10 MED ORDER — ACETAMINOPHEN 500 MG PO TABS
1000.0000 mg | ORAL_TABLET | Freq: Once | ORAL | Status: AC
  Administered 2018-10-10: 10:00:00 1000 mg via ORAL

## 2018-10-10 MED ORDER — INSULIN ASPART 100 UNIT/ML ~~LOC~~ SOLN
0.0000 [IU] | Freq: Three times a day (TID) | SUBCUTANEOUS | Status: DC
  Administered 2018-10-10 – 2018-10-11 (×2): 3 [IU] via SUBCUTANEOUS

## 2018-10-10 MED ORDER — AMLODIPINE BESYLATE 5 MG PO TABS
10.0000 mg | ORAL_TABLET | Freq: Every evening | ORAL | Status: DC
  Administered 2018-10-10: 18:00:00 10 mg via ORAL
  Filled 2018-10-10: qty 2

## 2018-10-10 MED ORDER — FLUTICASONE PROPIONATE 50 MCG/ACT NA SUSP: 1.0000 | Freq: Every day | NASAL | Status: DC

## 2018-10-10 MED ORDER — ONDANSETRON HCL 4 MG/2ML IJ SOLN
INTRAMUSCULAR | Status: DC | PRN
  Administered 2018-10-10: 4 mg via INTRAVENOUS

## 2018-10-10 MED ORDER — SCOPOLAMINE 1 MG/3DAYS TD PT72
MEDICATED_PATCH | TRANSDERMAL | Status: AC
  Administered 2018-10-10: 10:00:00 1.5 mg via TRANSDERMAL
  Filled 2018-10-10: qty 1

## 2018-10-10 MED ORDER — DULAGLUTIDE 0.75 MG/0.5ML ~~LOC~~ SOAJ: 0.7500 mg | SUBCUTANEOUS | Status: DC

## 2018-10-10 MED ORDER — CEFAZOLIN SODIUM-DEXTROSE 2-4 GM/100ML-% IV SOLN
INTRAVENOUS | Status: AC
  Filled 2018-10-10: qty 100

## 2018-10-10 MED ORDER — ACETAMINOPHEN 650 MG RE SUPP: 650.0000 mg | RECTAL | Status: DC | PRN

## 2018-10-10 MED ORDER — KCL IN DEXTROSE-NACL 20-5-0.45 MEQ/L-%-% IV SOLN: INTRAVENOUS | Status: DC

## 2018-10-10 MED ORDER — ESMOLOL HCL 100 MG/10ML IV SOLN
INTRAVENOUS | Status: AC
  Filled 2018-10-10: qty 10

## 2018-10-10 MED ORDER — MIDAZOLAM HCL 5 MG/5ML IJ SOLN
INTRAMUSCULAR | Status: DC | PRN
  Administered 2018-10-10: 2 mg via INTRAVENOUS

## 2018-10-10 MED ORDER — SODIUM CHLORIDE 0.9 % IV SOLN
INTRAVENOUS | Status: DC | PRN
  Administered 2018-10-10: 30 ug/min via INTRAVENOUS

## 2018-10-10 MED ORDER — LEVOCETIRIZINE DIHYDROCHLORIDE 5 MG PO TABS: 5.0000 mg | ORAL_TABLET | Freq: Every evening | ORAL | Status: DC

## 2018-10-10 MED ORDER — ACETAMINOPHEN 500 MG PO TABS: 1000.0000 mg | ORAL_TABLET | Freq: Three times a day (TID) | ORAL | Status: DC | PRN

## 2018-10-10 MED ORDER — THROMBIN 5000 UNITS EX SOLR
CUTANEOUS | Status: AC
  Filled 2018-10-10: qty 5000

## 2018-10-10 MED ORDER — SUGAMMADEX SODIUM 200 MG/2ML IV SOLN
INTRAVENOUS | Status: DC | PRN
  Administered 2018-10-10: 200 mg via INTRAVENOUS

## 2018-10-10 MED ORDER — SCOPOLAMINE 1 MG/3DAYS TD PT72
1.0000 | MEDICATED_PATCH | TRANSDERMAL | Status: DC
  Administered 2018-10-10: 10:00:00 1.5 mg via TRANSDERMAL

## 2018-10-10 MED ORDER — 0.9 % SODIUM CHLORIDE (POUR BTL) OPTIME
TOPICAL | Status: DC | PRN
  Administered 2018-10-10: 1000 mL

## 2018-10-10 MED ORDER — ADULT MULTIVITAMIN W/MINERALS CH: 1.0000 | ORAL_TABLET | Freq: Every day | ORAL | Status: DC

## 2018-10-10 MED ORDER — THROMBIN 5000 UNITS EX SOLR
OROMUCOSAL | Status: DC | PRN
  Administered 2018-10-10: 15:00:00 5 mL via TOPICAL

## 2018-10-10 MED ORDER — EPHEDRINE SULFATE-NACL 50-0.9 MG/10ML-% IV SOSY
PREFILLED_SYRINGE | INTRAVENOUS | Status: DC | PRN
  Administered 2018-10-10: 10 mg via INTRAVENOUS

## 2018-10-10 MED ORDER — ROCURONIUM BROMIDE 50 MG/5ML IV SOSY
PREFILLED_SYRINGE | INTRAVENOUS | Status: DC | PRN
  Administered 2018-10-10: 60 mg via INTRAVENOUS

## 2018-10-10 MED ORDER — BISACODYL 10 MG RE SUPP: 10.0000 mg | Freq: Every day | RECTAL | Status: DC | PRN

## 2018-10-10 MED ORDER — CEFAZOLIN SODIUM-DEXTROSE 2-4 GM/100ML-% IV SOLN
2.0000 g | Freq: Three times a day (TID) | INTRAVENOUS | Status: AC
  Administered 2018-10-10 – 2018-10-11 (×2): 2 g via INTRAVENOUS
  Filled 2018-10-10 (×2): qty 100

## 2018-10-10 MED ORDER — SODIUM CHLORIDE 0.9% FLUSH
3.0000 mL | Freq: Two times a day (BID) | INTRAVENOUS | Status: DC
  Administered 2018-10-10: 22:00:00 3 mL via INTRAVENOUS

## 2018-10-10 MED ORDER — MAGNESIUM OXIDE 400 (241.3 MG) MG PO TABS: 400.0000 mg | ORAL_TABLET | Freq: Every day | ORAL | Status: DC

## 2018-10-10 MED ORDER — CELECOXIB 200 MG PO CAPS
ORAL_CAPSULE | ORAL | Status: AC
  Filled 2018-10-10: qty 2

## 2018-10-10 MED ORDER — LORATADINE 10 MG PO TABS
10.0000 mg | ORAL_TABLET | Freq: Every evening | ORAL | Status: DC
  Administered 2018-10-10: 10 mg via ORAL
  Filled 2018-10-10: qty 1

## 2018-10-10 MED ORDER — ACETAMINOPHEN 500 MG PO TABS
ORAL_TABLET | ORAL | Status: AC
  Administered 2018-10-10: 10:00:00 1000 mg via ORAL
  Filled 2018-10-10: qty 2

## 2018-10-10 MED ORDER — CEFAZOLIN SODIUM-DEXTROSE 2-4 GM/100ML-% IV SOLN
2.0000 g | INTRAVENOUS | Status: AC
  Administered 2018-10-10: 2 g via INTRAVENOUS

## 2018-10-10 MED ORDER — ONDANSETRON HCL 4 MG PO TABS: 4.0000 mg | ORAL_TABLET | Freq: Four times a day (QID) | ORAL | Status: DC | PRN

## 2018-10-10 MED ORDER — MENTHOL 3 MG MT LOZG: 1.0000 | LOZENGE | OROMUCOSAL | Status: DC | PRN

## 2018-10-10 MED ORDER — BUPIVACAINE HCL (PF) 0.5 % IJ SOLN
INTRAMUSCULAR | Status: AC
  Filled 2018-10-10: qty 30

## 2018-10-10 MED ORDER — BUPIVACAINE HCL (PF) 0.5 % IJ SOLN
INTRAMUSCULAR | Status: DC | PRN
  Administered 2018-10-10: 3.5 mL

## 2018-10-10 MED ORDER — GABAPENTIN 300 MG PO CAPS
ORAL_CAPSULE | ORAL | Status: AC
  Filled 2018-10-10: qty 1

## 2018-10-10 MED ORDER — MORPHINE SULFATE (PF) 2 MG/ML IV SOLN: 2.0000 mg | INTRAVENOUS | Status: DC | PRN

## 2018-10-10 MED ORDER — MAGNESIUM 400 MG PO CAPS: 400.0000 mg | ORAL_CAPSULE | Freq: Every day | ORAL | Status: DC

## 2018-10-10 MED ORDER — LIDOCAINE-EPINEPHRINE 1 %-1:100000 IJ SOLN
INTRAMUSCULAR | Status: DC | PRN
  Administered 2018-10-10: 3.5 mL

## 2018-10-10 MED ORDER — OXYCODONE HCL 5 MG PO TABS: 5.0000 mg | ORAL_TABLET | ORAL | Status: DC | PRN

## 2018-10-10 MED ORDER — THROMBIN 20000 UNITS EX SOLR
CUTANEOUS | Status: DC | PRN
  Administered 2018-10-10: 20 mL via TOPICAL

## 2018-10-10 MED ORDER — PANTOPRAZOLE SODIUM 40 MG PO TBEC
40.0000 mg | DELAYED_RELEASE_TABLET | Freq: Every day | ORAL | Status: DC
  Administered 2018-10-10: 40 mg via ORAL
  Filled 2018-10-10: qty 1

## 2018-10-10 MED ORDER — INSULIN ASPART 100 UNIT/ML ~~LOC~~ SOLN
4.0000 [IU] | Freq: Three times a day (TID) | SUBCUTANEOUS | Status: DC
  Administered 2018-10-10 – 2018-10-11 (×2): 4 [IU] via SUBCUTANEOUS

## 2018-10-10 MED ORDER — THROMBIN 20000 UNITS EX SOLR
CUTANEOUS | Status: AC
  Filled 2018-10-10: qty 20000

## 2018-10-10 SURGICAL SUPPLY — 74 items
BASKET BONE COLLECTION (BASKET) ×3 IMPLANT
BIT DRILL NEURO 2X3.1 SFT TUCH (MISCELLANEOUS) ×1 IMPLANT
BIT DRILL POWER (BIT) ×1 IMPLANT
BLADE SURG 15 STRL LF DISP TIS (BLADE) ×1 IMPLANT
BLADE SURG 15 STRL SS (BLADE) ×2
BLADE ULTRA TIP 2M (BLADE) IMPLANT
BNDG GAUZE ELAST 4 BULKY (GAUZE/BANDAGES/DRESSINGS) ×6 IMPLANT
BUR BARREL STRAIGHT FLUTE 4.0 (BURR) ×6 IMPLANT
CAGE CERVICAL 12X14X7 7D (Cage) ×2 IMPLANT
CAGE CERVICAL 12X14X7MM 7DEG (Cage) ×1 IMPLANT
CANISTER SUCT 3000ML PPV (MISCELLANEOUS) ×3 IMPLANT
CAP END MONOLITH PARA 14 RND (Cap) ×2 IMPLANT
CARTRIDGE OIL MAESTRO DRILL (MISCELLANEOUS) ×1 IMPLANT
CORE MONOLITH 012X11 (Cage) ×3 IMPLANT
COVER MAYO STAND STRL (DRAPES) ×3 IMPLANT
COVER WAND RF STERILE (DRAPES) IMPLANT
DECANTER SPIKE VIAL GLASS SM (MISCELLANEOUS) ×3 IMPLANT
DERMABOND ADVANCED (GAUZE/BANDAGES/DRESSINGS) ×2
DERMABOND ADVANCED .7 DNX12 (GAUZE/BANDAGES/DRESSINGS) ×1 IMPLANT
DIFFUSER DRILL AIR PNEUMATIC (MISCELLANEOUS) ×3 IMPLANT
DRAIN JACKSON PRATT 10MM FLAT (MISCELLANEOUS) ×3 IMPLANT
DRAPE HALF SHEET 40X57 (DRAPES) IMPLANT
DRAPE LAPAROTOMY 100X72 PEDS (DRAPES) ×3 IMPLANT
DRAPE MICROSCOPE LEICA (MISCELLANEOUS) ×3 IMPLANT
DRILL BIT POWER (BIT) ×2
DRILL NEURO 2X3.1 SOFT TOUCH (MISCELLANEOUS) ×3
DRSG OPSITE POSTOP 3X4 (GAUZE/BANDAGES/DRESSINGS) ×3 IMPLANT
DURAPREP 6ML APPLICATOR 50/CS (WOUND CARE) ×3 IMPLANT
ELECT COATED BLADE 2.86 ST (ELECTRODE) ×3 IMPLANT
ELECT REM PT RETURN 9FT ADLT (ELECTROSURGICAL) ×3
ELECTRODE REM PT RTRN 9FT ADLT (ELECTROSURGICAL) ×1 IMPLANT
ENDCAP MONOLITH PARA 14 RND (Cap) ×4 IMPLANT
EVACUATOR SILICONE 100CC (DRAIN) ×3 IMPLANT
GAUZE 4X4 16PLY RFD (DISPOSABLE) IMPLANT
GAUZE SPONGE 4X4 12PLY STRL (GAUZE/BANDAGES/DRESSINGS) IMPLANT
GLOVE BIO SURGEON STRL SZ7.5 (GLOVE) ×3 IMPLANT
GLOVE BIO SURGEON STRL SZ8 (GLOVE) ×6 IMPLANT
GLOVE BIOGEL PI IND STRL 7.0 (GLOVE) ×1 IMPLANT
GLOVE BIOGEL PI IND STRL 7.5 (GLOVE) ×1 IMPLANT
GLOVE BIOGEL PI IND STRL 8 (GLOVE) ×3 IMPLANT
GLOVE BIOGEL PI IND STRL 8.5 (GLOVE) ×1 IMPLANT
GLOVE BIOGEL PI INDICATOR 7.0 (GLOVE) ×2
GLOVE BIOGEL PI INDICATOR 7.5 (GLOVE) ×2
GLOVE BIOGEL PI INDICATOR 8 (GLOVE) ×6
GLOVE BIOGEL PI INDICATOR 8.5 (GLOVE) ×2
GLOVE ECLIPSE 7.5 STRL STRAW (GLOVE) ×9 IMPLANT
GLOVE ECLIPSE 8.0 STRL XLNG CF (GLOVE) ×3 IMPLANT
GLOVE EXAM NITRILE XL STR (GLOVE) IMPLANT
GOWN STRL REUS W/ TWL LRG LVL3 (GOWN DISPOSABLE) ×1 IMPLANT
GOWN STRL REUS W/ TWL XL LVL3 (GOWN DISPOSABLE) ×1 IMPLANT
GOWN STRL REUS W/TWL 2XL LVL3 (GOWN DISPOSABLE) ×6 IMPLANT
GOWN STRL REUS W/TWL LRG LVL3 (GOWN DISPOSABLE) ×2
GOWN STRL REUS W/TWL XL LVL3 (GOWN DISPOSABLE) ×2
HALTER HD/CHIN CERV TRACTION D (MISCELLANEOUS) ×3 IMPLANT
HEMOSTAT POWDER KIT SURGIFOAM (HEMOSTASIS) ×3 IMPLANT
KIT BASIN OR (CUSTOM PROCEDURE TRAY) ×3 IMPLANT
KIT TURNOVER KIT B (KITS) ×3 IMPLANT
NEEDLE HYPO 25X1 1.5 SAFETY (NEEDLE) ×3 IMPLANT
NEEDLE SPNL 22GX3.5 QUINCKE BK (NEEDLE) ×6 IMPLANT
NS IRRIG 1000ML POUR BTL (IV SOLUTION) ×3 IMPLANT
OIL CARTRIDGE MAESTRO DRILL (MISCELLANEOUS) ×3
PACK LAMINECTOMY NEURO (CUSTOM PROCEDURE TRAY) ×3 IMPLANT
PAD ARMBOARD 7.5X6 YLW CONV (MISCELLANEOUS) ×9 IMPLANT
PIN DISTRACTION 14MM (PIN) ×6 IMPLANT
PLATE ARCHON 2-LEVEL 54MM (Plate) ×3 IMPLANT
RUBBERBAND STERILE (MISCELLANEOUS) ×6 IMPLANT
SCREW ARCHON SELFTAP 4.0X13 (Screw) ×18 IMPLANT
SPONGE INTESTINAL PEANUT (DISPOSABLE) ×3 IMPLANT
SPONGE SURGIFOAM ABS GEL 100 (HEMOSTASIS) ×3 IMPLANT
STAPLER SKIN PROX WIDE 3.9 (STAPLE) ×3 IMPLANT
SUT VIC AB 3-0 SH 8-18 (SUTURE) ×6 IMPLANT
TOWEL GREEN STERILE (TOWEL DISPOSABLE) ×3 IMPLANT
TOWEL GREEN STERILE FF (TOWEL DISPOSABLE) ×3 IMPLANT
WATER STERILE IRR 1000ML POUR (IV SOLUTION) ×3 IMPLANT

## 2018-10-10 NOTE — Anesthesia Procedure Notes (Signed)
Procedure Name: Intubation Date/Time: 10/10/2018 1:03 PM Performed by: Myna Bright, CRNA Pre-anesthesia Checklist: Patient identified, Emergency Drugs available, Suction available and Patient being monitored Patient Re-evaluated:Patient Re-evaluated prior to induction Oxygen Delivery Method: Circle system utilized Preoxygenation: Pre-oxygenation with 100% oxygen Induction Type: IV induction Ventilation: Mask ventilation without difficulty Laryngoscope Size: Mac and 4 Grade View: Grade II Tube type: Oral Tube size: 7.5 mm Number of attempts: 1 Airway Equipment and Method: Stylet Placement Confirmation: ETT inserted through vocal cords under direct vision,  positive ETCO2 and breath sounds checked- equal and bilateral Secured at: 22 cm Tube secured with: Tape Dental Injury: Teeth and Oropharynx as per pre-operative assessment

## 2018-10-10 NOTE — Anesthesia Preprocedure Evaluation (Addendum)
Anesthesia Evaluation  Patient identified by MRN, date of birth, ID band Patient awake    Reviewed: Allergy & Precautions, NPO status , Patient's Chart, lab work & pertinent test results  History of Anesthesia Complications Negative for: history of anesthetic complications  Airway Mallampati: III  TM Distance: >3 FB Neck ROM: Full    Dental no notable dental hx. (+) Dental Advisory Given   Pulmonary asthma , sleep apnea and Continuous Positive Airway Pressure Ventilation ,    Pulmonary exam normal        Cardiovascular hypertension, Pt. on medications Normal cardiovascular exam  Stress echo 12/31/14 ([facility]; Scanned under Media tab, Encounter date 05/24/15) Normal stress echo.    Neuro/Psych Anxiety negative neurological ROS  negative psych ROS   GI/Hepatic Neg liver ROS, GERD  Medicated,  Endo/Other  negative endocrine ROSdiabetes, Insulin Dependent  Renal/GU Renal InsufficiencyRenal diseasenegative Renal ROS  negative genitourinary   Musculoskeletal  (+) Arthritis , Osteoarthritis,    Abdominal   Peds negative pediatric ROS (+)  Hematology negative hematology ROS (+)   Anesthesia Other Findings h/o cervical spine surgery  Reproductive/Obstetrics negative OB ROS                            Anesthesia Physical  Anesthesia Plan  ASA: III  Anesthesia Plan: General   Post-op Pain Management:    Induction: Intravenous  PONV Risk Score and Plan: 3 and Ondansetron, Dexamethasone and Scopolamine patch - Pre-op  Airway Management Planned: Oral ETT  Additional Equipment:   Intra-op Plan:   Post-operative Plan: Extubation in OR  Informed Consent: I have reviewed the patients History and Physical, chart, labs and discussed the procedure including the risks, benefits and alternatives for the proposed anesthesia with the patient or authorized representative who  has indicated his/her understanding and acceptance.     Dental advisory given  Plan Discussed with: Anesthesiologist and CRNA  Anesthesia Plan Comments:        Anesthesia Quick Evaluation

## 2018-10-10 NOTE — Op Note (Signed)
10/10/2018  4:09 PM  PATIENT:  Allen [name]  68 y.o. male  PRE-OPERATIVE DIAGNOSIS:  Stenosis of cervical spine with myelopathy, herniated cervical disc, radiculopathy, cervicalgia, pseudoarthrosis  POST-OPERATIVE DIAGNOSIS:  Stenosis of cervical spine with myelopathy, herniated cervical disc, radiculopathy, cervicalgia, pseudoarthrosis   PROCEDURE:  Procedure(s): Right sided revision with hardware removal at Cervical three-four, Cervical four-five Anterior cervical decompression/discectomy/fusion with corpectomy at Cervical five (Right) exploration of fusion, redo ACDF C 34 with PEEK cage and autograft, redo ACDF C 45 with corpectomy of C 5 and PEEK cage with autograft, anterior cervical plate C 3 - C 6 levels  SURGEON:  Surgeon(s) and Role:    Erline Levine, MD - Primary  PHYSICIAN ASSISTANT: Ostergard, MD  ASSISTANTS: Poteat, RN   ANESTHESIA:   general  EBL:  100 mL   BLOOD ADMINISTERED:none  DRAINS: (# 10) Jackson-Pratt drain(s) with closed bulb suction in the prevertebral space   LOCAL MEDICATIONS USED:  MARCAINE    and LIDOCAINE   SPECIMEN:  No Specimen  DISPOSITION OF SPECIMEN:  N/A  COUNTS:  YES  TOURNIQUET:  * No tourniquets in log *  DICTATION: Patient is 68 year old male with left arm pain and weakness with HNP, pseudoarthrosis, radiculopathy C 45 and C 34 levels  PROCEDURE: Patient was brought to operating room and following the smooth and uncomplicated induction of general endotracheal anesthesia his head was placed on a horseshoe head holder he was placed in 5 pounds of Holter traction and his anterior neck was prepped and draped in usual sterile fashion. An incision was made on the right side of midline after infiltrating the skin and subcutaneous tissues with local lidocaine. The platysmal layer was incised and subplatysmal dissection was performed exposing the anterior border sternocleidomastoid muscle. Using blunt dissection the carotid sheath was  kept lateral and trachea and esophagus kept medial exposing the anterior cervical spine. The previously placed plate was cleared of scar tissue and removed.   Longus coli muscles were taken down from the anterior cervical spine using electrocautery and key elevator and self-retaining retractor was placed exposing the C 34 and C 45 levels.The C 45 graft was completely loose and was removed.  Much of the C 5 body had been removed, so we elected to perform a corpectomy of C 5.  This was done with high speed drill under microscope.  Spinal cord dura and both C 5 nerve roots were decompressed.  There was disc material overlying both nerve roots.  Distraction pins were placed at C 4 and C 6.  Hemostasis was assured with Surgifoam.  A modular PEEK corpectomy cage was assembled, packed with autograft and tamped into position.  Its positioning was confirmed on C arm X ray.   This was a 16 mm corpectomy cage.  Attention was then turned to the C 34 level.  The old graft was removed.  Spinal cord dura and both C 4 nerve roots were decompressed.  This area appeared to have not been operated on.   Uncinate spurs and central spondylitic ridges were drilled down with a high-speed drill. The spinal cord dura and both C4 nerve roots were widely decompressed. Hemostasis was assured. After trial sizing a 7 mm peek interbody cage was selected and packed with autograft. This was tamped into position and countersunk appropriately.  A 54 mm Archon anterior cervical plate was affixed to the cervical spine with 13 mm variable-angle screws 2 at C3, 2 at C4 and 2 at C6. All  screws were well-positioned and locking mechanisms were engaged. A final X ray was obtained which showed well positioned grafts and anterior plate without complicating features. Soft tissues were inspected and found to be in good repair. The wound was irrigated. A # 10 JP drain was placed through a separate stab incision.  The platysma layer was closed with 3-0 Vicryl  stitches and the skin was reapproximated with 3-0 Vicryl subcuticular stitches. The wound was dressed with Dermabond. Counts were correct at the end of the case. Patient was extubated and taken to recovery in stable and satisfactory condition.    PLAN OF CARE: Admit to inpatient   PATIENT DISPOSITION:  PACU - hemodynamically stable.   Delay start of Pharmacological VTE agent (>24hrs) due to surgical blood loss or risk of bleeding: yes

## 2018-10-10 NOTE — Transfer of Care (Signed)
Immediate Anesthesia Transfer of Care Note  Patient: Allen [name]  Procedure(s) Performed: Right sided revision with hardware removal at Cervical three-four, Cervical four-five Anterior cervical decompression/discectomy/fusion with corpectomy at Cervical five (Right Neck)  Patient Location: PACU  Anesthesia Type:General  Level of Consciousness: awake, alert , oriented and patient cooperative  Airway & Oxygen Therapy: Patient Spontanous Breathing and Patient connected to nasal cannula oxygen  Post-op Assessment: Report given to RN, Post -op Vital signs reviewed and stable and Patient moving all extremities  Post vital signs: Reviewed and stable  Last Vitals:  Vitals Value Taken Time  BP 134/98 10/10/18 1604  Temp    Pulse 84 10/10/18 1609  Resp 29 10/10/18 1609  SpO2 95 % 10/10/18 1609  Vitals shown include unvalidated device data.  Last Pain:  Vitals:   10/10/18 1023  TempSrc:   PainSc: 0-No pain      Patients Stated Pain Goal: 4 ([id number])  Complications: No apparent anesthesia complications

## 2018-10-10 NOTE — Brief Op Note (Signed)
10/10/2018  4:09 PM  PATIENT:  Allen [name]  68 y.o. male  PRE-OPERATIVE DIAGNOSIS:  Stenosis of cervical spine with myelopathy, herniated cervical disc, radiculopathy, cervicalgia, pseudoarthrosis  POST-OPERATIVE DIAGNOSIS:  Stenosis of cervical spine with myelopathy, herniated cervical disc, radiculopathy, cervicalgia, pseudoarthrosis   PROCEDURE:  Procedure(s): Right sided revision with hardware removal at Cervical three-four, Cervical four-five Anterior cervical decompression/discectomy/fusion with corpectomy at Cervical five (Right) exploration of fusion, redo ACDF C 34 with PEEK cage and autograft, redo ACDF C 45 with corpectomy of C 5 and PEEK cage with autograft, anterior cervical plate C 3 - C 6 levels  SURGEON:  Surgeon(s) and Role:    Erline Levine, MD - Primary  PHYSICIAN ASSISTANT: Ostergard, MD  ASSISTANTS: Poteat, RN   ANESTHESIA:   general  EBL:  100 mL   BLOOD ADMINISTERED:none  DRAINS: (# 10) Jackson-Pratt drain(s) with closed bulb suction in the prevertebral space   LOCAL MEDICATIONS USED:  MARCAINE    and LIDOCAINE   SPECIMEN:  No Specimen  DISPOSITION OF SPECIMEN:  N/A  COUNTS:  YES  TOURNIQUET:  * No tourniquets in log *  DICTATION: Patient is 68 year old male with left arm pain and weakness with HNP, pseudoarthrosis, radiculopathy C 45 and C 34 levels  PROCEDURE: Patient was brought to operating room and following the smooth and uncomplicated induction of general endotracheal anesthesia his head was placed on a horseshoe head holder he was placed in 5 pounds of Holter traction and his anterior neck was prepped and draped in usual sterile fashion. An incision was made on the right side of midline after infiltrating the skin and subcutaneous tissues with local lidocaine. The platysmal layer was incised and subplatysmal dissection was performed exposing the anterior border sternocleidomastoid muscle. Using blunt dissection the carotid sheath was  kept lateral and trachea and esophagus kept medial exposing the anterior cervical spine. The previously placed plate was cleared of scar tissue and removed.   Longus coli muscles were taken down from the anterior cervical spine using electrocautery and key elevator and self-retaining retractor was placed exposing the C 34 and C 45 levels.The C 45 graft was completely loose and was removed.  Much of the C 5 body had been removed, so we elected to perform a corpectomy of C 5.  This was done with high speed drill under microscope.  Spinal cord dura and both C 5 nerve roots were decompressed.  There was disc material overlying both nerve roots.  Distraction pins were placed at C 4 and C 6.  Hemostasis was assured with Surgifoam.  A modular PEEK corpectomy cage was assembled, packed with autograft and tamped into position.  Its positioning was confirmed on C arm X ray.   This was a 16 mm corpectomy cage.  Attention was then turned to the C 34 level.  The old graft was removed.  Spinal cord dura and both C 4 nerve roots were decompressed.  This area appeared to have not been operated on.   Uncinate spurs and central spondylitic ridges were drilled down with a high-speed drill. The spinal cord dura and both C4 nerve roots were widely decompressed. Hemostasis was assured. After trial sizing a 7 mm peek interbody cage was selected and packed with autograft. This was tamped into position and countersunk appropriately.  A 54 mm Archon anterior cervical plate was affixed to the cervical spine with 13 mm variable-angle screws 2 at C3, 2 at C4 and 2 at C6. All  screws were well-positioned and locking mechanisms were engaged. A final X ray was obtained which showed well positioned grafts and anterior plate without complicating features. Soft tissues were inspected and found to be in good repair. The wound was irrigated. A # 10 JP drain was placed through a separate stab incision.  The platysma layer was closed with 3-0 Vicryl  stitches and the skin was reapproximated with 3-0 Vicryl subcuticular stitches. The wound was dressed with Dermabond. Counts were correct at the end of the case. Patient was extubated and taken to recovery in stable and satisfactory condition.    PLAN OF CARE: Admit to inpatient   PATIENT DISPOSITION:  PACU - hemodynamically stable.   Delay start of Pharmacological VTE agent (>24hrs) due to surgical blood loss or risk of bleeding: yes

## 2018-10-10 NOTE — Progress Notes (Signed)
Patient is awake, alert, conversant.  MAEW with good power.  He is still weak in his left deltoid, but he and I both think he is stronger than preop.  Right deltoid strength is full.  Patient states his pain is improved as is his numbness.  Doing well.

## 2018-10-10 NOTE — Interval H&P Note (Signed)
History and Physical Interval Note:  10/10/2018 12:05 PM  Allen [name]  has presented today for surgery, with the diagnosis of Stenosis of cervical spine with myelopathy.  The various methods of treatment have been discussed with the patient and family. After consideration of risks, benefits and other options for treatment, the patient has consented to  Procedure(s) with comments: Right sided revision with hardware removal at Cervical 3-4, Cervical 4-5 Anterior cervical decompression/discectomy/fusion with possible corpectomy at Cervical 5 (Right) - Right sided revision with hardware removal at Cervical 3-4, Cervical 4-5 Anterior cervical decompression/discectomy/fusion with possible corpectomy at Cervical 5 as a surgical intervention.  The patient's history has been reviewed, patient examined, no change in status, stable for surgery.  I have reviewed the patient's chart and labs.  Questions were answered to the patient's satisfaction.     [name]

## 2018-10-11 LAB — GLUCOSE, CAPILLARY: Glucose-Capillary: 167 mg/dL — ABNORMAL HIGH (ref 70–99)

## 2018-10-11 MED ORDER — HYDROCODONE-ACETAMINOPHEN 5-325 MG PO TABS: 1.0000 | ORAL_TABLET | ORAL | 0 refills | Status: DC | PRN

## 2018-10-11 MED ORDER — METHOCARBAMOL 500 MG PO TABS: 500.0000 mg | ORAL_TABLET | Freq: Four times a day (QID) | ORAL | 0 refills | Status: DC | PRN

## 2018-10-11 NOTE — Anesthesia Postprocedure Evaluation (Signed)
Anesthesia Post Note  Patient: [place]  Procedure(s) Performed: Right sided revision with hardware removal at Cervical three-four, Cervical four-five Anterior cervical decompression/discectomy/fusion with corpectomy at Cervical five (Right Neck)     Patient location during evaluation: PACU Anesthesia Type: General Level of consciousness: sedated and patient cooperative Pain management: pain level controlled Vital Signs Assessment: post-procedure vital signs reviewed and stable Respiratory status: spontaneous breathing Cardiovascular status: stable Anesthetic complications: no    Last Vitals:  Vitals:   10/11/18 0329 10/11/18 0729  BP: 130/70 (!) 145/83  Pulse: 66 61  Resp: 20 18  Temp: 36.6 C 36.7 C  SpO2: 97% 96%    Last Pain:  Vitals:   10/11/18 0800  TempSrc:   PainSc: [place]

## 2018-10-11 NOTE — Evaluation (Signed)
Physical Therapy Evaluation and Discharge Patient Details Name: Allen [name] MRN: [id number] DOB: 21-Oct-1950 Today's Date: 10/11/2018   History of Present Illness  Pt is a 68 y/o male who presents s/p C4-C5 ACDF, redo C4-C5 and redo C3-C4 with anterior cervical plate placement 624THL on 10/10/2018. PMH significant for HTN. DMII, CKD, CA.  Clinical Impression  Patient evaluated by Physical Therapy with no further acute PT needs identified. All education has been completed and the patient has no further questions. At the time of PT eval pt was able to perform transfers and ambulation with gross modified independence and no AD. Pt was educated on precautions, brace application/adjustment/wearing schedule, activity progression, and car transfer. See below for any follow-up Physical Therapy or equipment needs. PT is signing off. Thank you for this referral.     Follow Up Recommendations No PT follow up;Supervision - Intermittent    Equipment Recommendations  None recommended by PT    Recommendations for Other Services       Precautions / Restrictions Precautions Precautions: Fall;Cervical Precaution Booklet Issued: Yes (comment) Required Braces or Orthoses: Cervical Brace Cervical Brace: Hard collar;At all times Restrictions Weight Bearing Restrictions: No      Mobility  Bed Mobility Overal bed mobility: Modified Independent Bed Mobility: Rolling;Sidelying to Sit           General bed mobility comments: VC's for log roll technique. No assist required.   Transfers Overall transfer level: Modified independent Equipment used: None             General transfer comment: Pt demonstrated good hand placement on seated surface for safety without cues.   Ambulation/Gait Ambulation/Gait assistance: Modified independent (Device/Increase time) Gait Distance (Feet): 350 Feet Assistive device: None Gait Pattern/deviations: Step-through pattern;Decreased stride length;Trunk  flexed Gait velocity: Decreased Gait velocity interpretation: 1.31 - 2.62 ft/sec, indicative of limited community ambulator General Gait Details: VC's for improved posture. No unsteadiness or LOB noted.  Stairs Stairs: (Pt declined stair training)          Wheelchair Mobility    Modified Rankin (Stroke Patients Only)       Balance Overall balance assessment: Mild deficits observed, not formally tested                                           Pertinent Vitals/Pain Pain Assessment: 0-10 Pain Score: 2  Pain Location: Incision site Pain Descriptors / Indicators: Operative site guarding Pain Intervention(s): Limited activity within patient's tolerance;Monitored during session;Repositioned    Home Living Family/patient expects to be discharged to:: Private residence Living Arrangements: Spouse/significant other Available Help at Discharge: Family;Available 24 hours/day Type of Home: House Home Access: Stairs to enter   [employer] of Steps: 2 Home Layout: Laundry or work area in basement;Able to live on main level with bedroom/bathroom Home Equipment: Environmental consultant - 2 wheels;Cane - single point;Bedside commode;Shower seat Additional Comments: Pt reports he inherited DME from mother-in-law who passed away and "thinks" he has the above mentioned equipment.     Prior Function Level of Independence: Independent               Hand Dominance        Extremity/Trunk Assessment   Upper Extremity Assessment Upper Extremity Assessment: LUE deficits/detail LUE Deficits / Details: Decreased deltoid strength on the L. Pt reports no numbness or tingling since surgery. Noted decreased fine motor coordination but  overall functional with setting up his breakfast tray (fixing coffee, opening salt/pepper packets, etc) LUE Sensation: WNL LUE Coordination: decreased fine motor    Lower Extremity Assessment Lower Extremity Assessment: Overall WFL for tasks  assessed    Cervical / Trunk Assessment Cervical / Trunk Assessment: Other exceptions Cervical / Trunk Exceptions: s/p cervical surgery  Communication   Communication: No difficulties  Cognition Arousal/Alertness: Awake/alert Behavior During Therapy: WFL for tasks assessed/performed Overall Cognitive Status: Within Functional Limits for tasks assessed                                        General Comments      Exercises     Assessment/Plan    PT Assessment Patent does not need any further PT services  PT Problem List         PT Treatment Interventions      PT Goals (Current goals can be found in the Care Plan section)  Acute Rehab PT Goals Patient Stated Goal: Home today PT Goal Formulation: All assessment and education complete, DC therapy    Frequency     Barriers to discharge        Co-evaluation               AM-PAC PT "6 Clicks" Mobility  Outcome Measure Help needed turning from your back to your side while in a flat bed without using bedrails?: None Help needed moving from lying on your back to sitting on the side of a flat bed without using bedrails?: None Help needed moving to and from a bed to a chair (including a wheelchair)?: None Help needed standing up from a chair using your arms (e.g., wheelchair or bedside chair)?: None Help needed to walk in hospital room?: None Help needed climbing 3-5 steps with a railing? : None 6 Click Score: 24    End of Session Equipment Utilized During Treatment: Cervical collar Activity Tolerance: Patient tolerated treatment well Patient left: in chair;with call bell/phone within reach Nurse Communication: Mobility status PT Visit Diagnosis: Unsteadiness on feet (R26.81);Pain Pain - part of body: (neck)    Time: [id number] PT Time Calculation (min) (ACUTE ONLY): 19 min   Charges:   PT Evaluation $PT Eval Low Complexity: 1 Low          Rolinda Roan, PT, DPT Acute Rehabilitation  Services Pager: [phone] Office: [phone]   Thelma Comp 10/11/2018, 9:59 AM

## 2018-10-11 NOTE — Discharge Instructions (Signed)

## 2018-10-11 NOTE — Progress Notes (Signed)
Patient is discharged from room 3C03 at this time. Alert and in stable condition. IV site d/c'[d and instructions read to patient and spouse with understanding verbalized. Left unit via wheelchair with all belongings at side. 

## 2018-10-11 NOTE — Discharge Summary (Signed)
Physician Discharge Summary  Patient ID: Allen [name] MRN: [id number] DOB/AGE: [birth year] 68 y.o.  Admit date: 10/10/2018 Discharge date: 10/11/2018  Admission Diagnoses:  Discharge Diagnoses:  Active Problems:   Herniated cervical disc without myelopathy   Discharged Condition: good  Hospital Course: Patient admitted to the hospital where he underwent uncomplicated anterior cervical decompression and fusion.  Postop Truman Hayward doing very well.  Preoperative neck and upper extremity symptoms much improved.  Standing and walking without difficulty.  Voiding well.  No pain issues.  Ready for discharge home.  Consults:   Significant Diagnostic Studies:   Treatments:   Discharge Exam: Blood pressure (!) 145/83, pulse 61, temperature 98 F (36.7 C), temperature source Oral, resp. rate 18, height 5\' 11"  (1.803 m), weight 83.3 kg, SpO2 96 %. Awake and alert.  Oriented and appropriate.  Cranial nerve function intact.  Motor and sensory function intact.  Wound clean and dry.  Chest and abdomen benign.  Disposition: Discharge disposition: 01-Home or Self Care        Allergies as of 10/11/2018   No Known Allergies     Medication List    TAKE these medications   acetaminophen 500 MG tablet Commonly known as: TYLENOL Take 1,000 mg by mouth every 8 (eight) hours as needed for mild pain.   amLODipine 10 MG tablet Commonly known as: NORVASC Take 10 mg by mouth every evening.   CALCIUM 500+D3 PO Take 1 tablet by mouth 3 (three) times daily.   gabapentin 300 MG capsule Commonly known as: NEURONTIN Take 300 mg by mouth at bedtime as needed (cough).   HYDROcodone-acetaminophen 5-325 MG tablet Commonly known as: NORCO/VICODIN Take 1-2 tablets by mouth every 4 (four) hours as needed for moderate pain ((score 7 to 10)).   levocetirizine 5 MG tablet Commonly known as: XYZAL Take 5 mg by mouth every evening.   Magnesium 400 MG Caps Take 400 mg by mouth daily.   metFORMIN 1000  MG tablet Commonly known as: GLUCOPHAGE TAKE 1 TABLET BY MOUTH TWICE A DAY WITH MEALS What changed:   how much to take  how to take this   methocarbamol 500 MG tablet Commonly known as: ROBAXIN Take 1 tablet (500 mg total) by mouth every 6 (six) hours as needed for muscle spasms.   metoCLOPramide 5 MG tablet Commonly known as: REGLAN Take 5 mg by mouth 3 (three) times daily.   mometasone 50 MCG/ACT nasal spray Commonly known as: NASONEX Place 2 sprays into the nose daily as needed (congestion).   multivitamin with minerals Tabs tablet Take 1 tablet by mouth daily.   ondansetron 4 MG tablet Commonly known as: ZOFRAN Take 1 tablet (4 mg total) by mouth every 8 (eight) hours as needed. What changed: reasons to take this   Trulicity [id number] Sopn Generic drug: Dulaglutide INJECT 0.5ML INTO THE SKIN ONCE WEEKLY AS DIRECTED What changed: See the new instructions.   VITAMIN B-12 PO Take 300 mcg by mouth daily.        Signed: Cooper Render Takeesha Isley 10/11/2018, 9:19 AM

## 2018-10-11 NOTE — Progress Notes (Signed)
Occupational Therapy Evaluation Patient Details Name: Allen [name] MRN: [id number] DOB: 1951-01-16 Today's Date: 10/11/2018    History of Present Illness Pt is a 68 y/o male who presents s/p C4-C5 ACDF, redo C4-C5 and redo C3-C4 with anterior cervical plate placement 624THL on 10/10/2018. PMH significant for HTN. DMII, CKD, CA.   Clinical Impression   Completed education regarding ADL and cervical precations. Wife present for session and verbalized understanding. Pt with abnormal L scapular humeral rythm due to shoulder girdle weakness. Would benefit form outpatient therapy to address L shoulder weakness. States his daughter is a PT and plans to have her address his shoulder weakness. No further OT acute needs.     Follow Up Recommendations  Outpatient OT;Supervision - Intermittent(once cleared by MD for L shoulder girdle strengthening)    Equipment Recommendations  None recommended by OT    Recommendations for Other Services       Precautions / Restrictions Precautions Precautions: Fall;Cervical Precaution Booklet Issued: Yes (comment) Required Braces or Orthoses: Cervical Brace Cervical Brace: Hard collar;Other (comment)(May remove to shower and when in bed) Restrictions Weight Bearing Restrictions: No      Mobility Bed Mobility Overal bed mobility: Modified Independent Bed Mobility: Rolling;Sidelying to Sit           General bed mobility comments: VC's for log roll technique. No assist required.   Transfers Overall transfer level: Modified independent Equipment used: None               Balance Overall balance assessment: Mild deficits observed, not formally tested                                         ADL either performed or assessed with clinical judgement   ADL Overall ADL's : Needs assistance/impaired                                     Functional mobility during ADLs: Modified independent General ADL  Comments: completed education regarding cervical precatuions during ADL. Pt able to return demosntrate. Wife present.Written handout provided.      Vision         Perception     Praxis      Pertinent Vitals/Pain Pain Assessment: 0-10 Pain Score: 2  Pain Location: Incision site Pain Descriptors / Indicators: Operative site guarding Pain Intervention(s): Limited activity within patient's tolerance     Hand Dominance Right   Extremity/Trunk Assessment Upper Extremity Assessment Upper Extremity Assessment: LUE deficits/detail LUE Deficits / Details: Decreased deltoid strength on the L. abnormal scapular humeral rthym. LUE functional . LUE Sensation: WNL LUE Coordination: decreased fine motor;decreased gross motor   Lower Extremity Assessment Lower Extremity Assessment: Defer to PT evaluation   Cervical / Trunk Assessment Cervical / Trunk Assessment: Other exceptions Cervical / Trunk Exceptions: s/p cervical surgery   Communication Communication Communication: No difficulties   Cognition Arousal/Alertness: Awake/alert Behavior During Therapy: WFL for tasks assessed/performed Overall Cognitive Status: Within Functional Limits for tasks assessed                                     General Comments       Exercises Exercises: Other exercises Other Exercises Other Exercises: gentle shoulder rolls ant/posterior; wall  climbe to 90 Other Exercises: scapular retraction   Shoulder Instructions      Home Living Family/patient expects to be discharged to:: Private residence Living Arrangements: Spouse/significant other Available Help at Discharge: Family;Available 24 hours/day Type of Home: House Home Access: Stairs to enter [employer] of Steps: 2   Home Layout: Laundry or work area in basement;Able to live on main level with bedroom/bathroom     Bathroom Shower/Tub: Occupational psychologist: Standard Bathroom Accessibility:  Yes How Accessible: Accessible via walker Home Equipment: Environmental consultant - 2 wheels;Cane - single point;Bedside commode;Shower seat   Additional Comments: Pt reports he inherited DME from mother-in-law who passed away and "thinks" he has the above mentioned equipment.       Prior Functioning/Environment Level of Independence: Independent        Comments: having LUE problems before surgery        OT Problem List: Decreased strength;Decreased range of motion;Decreased coordination;Impaired UE functional use;Pain      OT Treatment/Interventions:      OT Goals(Current goals can be found in the care plan section) Acute Rehab OT Goals Patient Stated Goal: Home today OT Goal Formulation: All assessment and education complete, DC therapy  OT Frequency:     Barriers to D/C:            Co-evaluation              AM-PAC OT "6 Clicks" Daily Activity     Outcome Measure Help from another person eating meals?: None Help from another person taking care of personal grooming?: A Little Help from another person toileting, which includes using toliet, bedpan, or urinal?: A Little Help from another person bathing (including washing, rinsing, drying)?: A Little Help from another person to put on and taking off regular upper body clothing?: A Little Help from another person to put on and taking off regular lower body clothing?: A Little 6 Click Score: 19   End of Session Equipment Utilized During Treatment: Cervical collar Nurse Communication: Other (comment)(ready for DC)  Activity Tolerance: Patient tolerated treatment well Patient left: in chair;with call bell/phone within reach;with family/visitor present  OT Visit Diagnosis: Muscle weakness (generalized) (M62.81);Pain Pain - Right/Left: Left Pain - part of body: Shoulder(neck)                Time: [id number] OT Time Calculation (min): 25 min Charges:  OT General Charges $OT Visit: 1 Visit OT Evaluation $OT Eval Low Complexity: 1  Low OT Treatments $Self Care/Home Management : 8-22 mins  Maurie Boettcher, OT/L   Acute OT Clinical Specialist [place] Pager [phone] Office [phone]   [facility] 10/11/2018, 10:30 AM

## 2018-10-13 ENCOUNTER — Encounter (HOSPITAL_COMMUNITY): Payer: Self-pay | Admitting: Neurosurgery

## 2018-11-28 ENCOUNTER — Other Ambulatory Visit: Payer: Self-pay | Admitting: "Endocrinology

## 2018-12-01 LAB — CBC AND DIFFERENTIAL
HCT: 38 — AB (ref 41–53)
Hemoglobin: 12.3 — AB (ref 13.5–17.5)
Platelets: 201 (ref 150–399)
WBC: 6.4

## 2018-12-01 LAB — BASIC METABOLIC PANEL
BUN: 21 (ref 4–21)
CO2: 31 — AB (ref 13–22)
Chloride: 103 (ref 99–108)
Creatinine: 1.3 (ref <=1.3)
Glucose: 132
Potassium: 4.1 (ref 3.4–5.3)
Sodium: 140 (ref 137–147)

## 2018-12-01 LAB — TSH: TSH: 1.7 (ref 0.41–5.90)

## 2018-12-01 LAB — COMPREHENSIVE METABOLIC PANEL
Albumin: 3.9 (ref 3.5–5.0)
Calcium: 9.3 (ref 8.7–10.7)

## 2018-12-01 LAB — HEPATIC FUNCTION PANEL
ALT: 21 (ref 10–40)
AST: 18 (ref 14–40)
Alkaline Phosphatase: 80 (ref 25–125)
Bilirubin, Total: 1

## 2018-12-01 LAB — VITAMIN D 25 HYDROXY (VIT D DEFICIENCY, FRACTURES): Vit D, 25-Hydroxy: 41

## 2018-12-01 LAB — CBC: RBC: 4.45 (ref 3.87–5.11)

## 2018-12-08 ENCOUNTER — Other Ambulatory Visit: Payer: Self-pay | Admitting: "Endocrinology

## 2018-12-08 ENCOUNTER — Telehealth: Payer: Self-pay | Admitting: "Endocrinology

## 2018-12-08 DIAGNOSIS — I10 Essential (primary) hypertension: Secondary | ICD-10-CM

## 2018-12-08 DIAGNOSIS — R5383 Other fatigue: Secondary | ICD-10-CM

## 2018-12-08 NOTE — Telephone Encounter (Signed)
Pt's wife called and said that he is scheduled for 1/15 and would like to know could you order a full panel on his thyroid and come in sooner. She said something is really wrong. He is losing hair, constipated, extreme fatigue and cold all the time. She said he spoke with his PMD and he said to contact you for this, that he would not order the test. He did have his T3 and T4 checked and it was in the low range. She will have his results faxed to [name] today. Please advise

## 2018-12-08 NOTE — Telephone Encounter (Signed)
Yes, he can repeat TSH, Free T4 and come in sooner.

## 2018-12-09 ENCOUNTER — Encounter (HOSPITAL_COMMUNITY): Payer: Self-pay

## 2018-12-30 ENCOUNTER — Ambulatory Visit (INDEPENDENT_AMBULATORY_CARE_PROVIDER_SITE_OTHER): Payer: Medicare Other | Admitting: "Endocrinology

## 2018-12-30 ENCOUNTER — Encounter: Payer: Self-pay | Admitting: "Endocrinology

## 2018-12-30 ENCOUNTER — Other Ambulatory Visit: Payer: Self-pay

## 2018-12-30 VITALS — BP 140/89 | HR 66 | Ht 71.0 in | Wt 183.0 lb

## 2018-12-30 DIAGNOSIS — I10 Essential (primary) hypertension: Secondary | ICD-10-CM | POA: Diagnosis not present

## 2018-12-30 DIAGNOSIS — E1165 Type 2 diabetes mellitus with hyperglycemia: Secondary | ICD-10-CM | POA: Diagnosis not present

## 2018-12-30 DIAGNOSIS — E782 Mixed hyperlipidemia: Secondary | ICD-10-CM | POA: Diagnosis not present

## 2018-12-30 LAB — POCT GLYCOSYLATED HEMOGLOBIN (HGB A1C): Hemoglobin A1C: 6.3 % — AB (ref 4.0–5.6)

## 2018-12-30 MED ORDER — METFORMIN HCL 500 MG PO TABS: 500.0000 mg | ORAL_TABLET | Freq: Every day | ORAL | 6 refills | Status: DC

## 2018-12-30 NOTE — Progress Notes (Signed)
12/30/2018   Endocrinology follow-up note   Subjective:    Patient ID: Allen [name], male    DOB: 12-15-1950. Patient is being seen in follow-up for the management of type 2 diabetes.    Vasireddy, Lanetta Inch, MD  Past Medical History:  Diagnosis Date  . Anemia    on iron since gastric bypass  . Arthritis    mild  . Asthma   . Cancer (HCC)    hx of skin cancer  . Chronic kidney disease    stage 3, now resolved since weight loss  . Claustrophobia   . Diabetes mellitus, type II ([place])    type 2 metformin  . GERD (gastroesophageal reflux disease)    takes nexium  . Hyperlipidemia   . Hypertension   . Pneumonia 15 yrs ago  . Sleep apnea    cpap, no cpap since may 2018 weight loss 80 labs    Past Surgical History:  Procedure Laterality Date  . ANTERIOR CERVICAL DECOMP/DISCECTOMY FUSION Right 10/10/2018   Procedure: Right sided revision with hardware removal at Cervical three-four, Cervical four-five Anterior cervical decompression/discectomy/fusion with corpectomy at Cervical five;  Surgeon: Erline Levine, MD;  Location: [facility];  Service: Neurosurgery;  Laterality: Right;  . CARDIAC CATHETERIZATION     patient reports he thinks he had one over 25 years ago  . carpal tunnel Bilateral   . CERVICAL VERTEBRAE EXCISION     and cleaning  . CHOLECYSTECTOMY    . cyst removed left hand  01-28-17  . ESOPHAGOGASTRODUODENOSCOPY (EGD) WITH PROPOFOL N/A 01/31/2017   Procedure: ESOPHAGOGASTRODUODENOSCOPY (EGD) WITH PROPOFOL;  Surgeon: Alphonsa Overall, MD;  Location: [facility];  Service: General;  Laterality: N/A;  . GASTRIC ROUX-EN-Y N/A 06/04/2016   Procedure: LAPAROSCOPIC ROUX-EN-Y GASTRIC BYPASS WITH UPPER ENDOSCOPY;  Surgeon: Clovis Riley, MD;  Location: [facility];  Service: General;  Laterality: N/A;  . NASAL SINUS SURGERY    . TRIGGER FINGER RELEASE     Social History   Socioeconomic History  . Marital status: Married    Spouse name: Not on file  . Number of children:  Not on file  . Years of education: Not on file  . Highest education level: Not on file  Occupational History  . Not on file  Social Needs  . Financial resource strain: Not on file  . Food insecurity    Worry: Not on file    Inability: Not on file  . Transportation needs    Medical: Not on file    Non-medical: Not on file  Tobacco Use  . Smoking status: Never Smoker  . Smokeless tobacco: Never Used  Substance and Sexual Activity  . Alcohol use: No  . Drug use: No  . Sexual activity: Yes  Lifestyle  . Physical activity    Days per week: Not on file    Minutes per session: Not on file  . Stress: Not on file  Relationships  . Social Herbalist on phone: Not on file    Gets together: Not on file    Attends religious service: Not on file    Active member of club or organization: Not on file    Attends meetings of clubs or organizations: Not on file    Relationship status: Not on file  Other Topics Concern  . Not on file  Social History Narrative  . Not on file   Outpatient Encounter Medications as of 12/30/2018  Medication Sig  . Semaglutide ([place],  0.25 OR 0.5 MG/DOSE, [place]) Inject 0.5 mg into the skin once a week.  Marland Kitchen acetaminophen (TYLENOL) 500 MG tablet Take 1,000 mg by mouth every 8 (eight) hours as needed for mild pain.  Marland Kitchen amLODipine (NORVASC) 10 MG tablet Take 10 mg by mouth every evening.   . Calcium Carb-Cholecalciferol (CALCIUM 500+D3 PO) Take 1 tablet by mouth 3 (three) times daily.   . Cyanocobalamin (VITAMIN B-12 PO) Take 300 mcg by mouth daily.  Marland Kitchen gabapentin (NEURONTIN) 300 MG capsule Take 300 mg by mouth at bedtime as needed (cough).   Marland Kitchen HYDROcodone-acetaminophen (NORCO/VICODIN) 5-325 MG tablet Take 1-2 tablets by mouth every 4 (four) hours as needed for moderate pain ((score 7 to 10)).  Marland Kitchen levocetirizine (XYZAL) 5 MG tablet Take 5 mg by mouth every evening.  . Magnesium 400 MG CAPS Take 400 mg by mouth daily.   . metFORMIN (GLUCOPHAGE) 500 MG tablet  Take 1 tablet (500 mg total) by mouth daily with breakfast.  . methocarbamol (ROBAXIN) 500 MG tablet Take 1 tablet (500 mg total) by mouth every 6 (six) hours as needed for muscle spasms.  . metoCLOPramide (REGLAN) 5 MG tablet Take 5 mg by mouth 3 (three) times daily.  . mometasone (NASONEX) 50 MCG/ACT nasal spray Place 2 sprays into the nose daily as needed (congestion).   . Multiple Vitamin (MULTIVITAMIN WITH MINERALS) TABS tablet Take 1 tablet by mouth daily.  . ondansetron (ZOFRAN) 4 MG tablet Take 1 tablet (4 mg total) by mouth every 8 (eight) hours as needed. (Patient taking differently: Take 4 mg by mouth every 8 (eight) hours as needed for nausea or vomiting. )  . [DISCONTINUED] Dulaglutide (TRULICITY) [id number] [id number] SOPN Inject 0.75 mg into the skin every Sunday.  . [DISCONTINUED] metFORMIN (GLUCOPHAGE) 1000 MG tablet TAKE 1 TABLET BY MOUTH TWICE A DAY WITH MEALS (Patient taking differently: 500 mg 2 (two) times daily with a meal. )   No facility-administered encounter medications on file as of 12/30/2018.    ALLERGIES: No Known Allergies VACCINATION STATUS:  There is no immunization history on file for this patient.  Diabetes He presents for his follow-up diabetic visit. He has type 2 diabetes mellitus. Onset time: He was diagnosed at approximate age of 89 years. His disease course has been improving. There are no hypoglycemic associated symptoms. Pertinent negatives for hypoglycemia include no confusion, headaches, pallor or seizures. Associated symptoms include fatigue. Pertinent negatives for diabetes include no chest pain, no polydipsia, no polyphagia, no polyuria and no weakness. There are no hypoglycemic complications. Symptoms are improving. Diabetic complications include nephropathy. Risk factors for coronary artery disease include diabetes mellitus, dyslipidemia, hypertension and male sex. His weight is decreasing steadily (He is status post Roux-en-Y gastric bypass- he has lost  pprox 100 kbs overall.). He is following a diabetic diet. When asked about meal planning, he reported none. He has not had a previous visit with a dietitian. He participates in exercise intermittently (He participates in golfing activities.). His home blood glucose trend is decreasing steadily. An ACE inhibitor/angiotensin II receptor blocker is being taken. Eye exam is current.  Hyperlipidemia This is a chronic problem. The current episode started more than 1 year ago. The problem is uncontrolled. Exacerbating diseases include chronic renal disease and diabetes. He has no history of obesity. Pertinent negatives include no chest pain, myalgias or shortness of breath. Current antihyperlipidemic treatment includes statins and bile acid squestrants. Compliance problems include adherence to diet.  Risk factors for coronary artery disease include diabetes  mellitus, dyslipidemia, hypertension, male sex, obesity and a sedentary lifestyle.  Hypertension This is a chronic problem. The current episode started more than 1 year ago. The problem is uncontrolled. Pertinent negatives include no chest pain, headaches, neck pain, palpitations or shortness of breath. Risk factors for coronary artery disease include diabetes mellitus, dyslipidemia and sedentary lifestyle. Past treatments include angiotensin blockers. Hypertensive end-organ damage includes kidney disease. Identifiable causes of hypertension include chronic renal disease.     Review of Systems  Constitutional: Positive for fatigue. Negative for chills, fever and unexpected weight change.  HENT: Negative for dental problem, mouth sores and trouble swallowing.   Eyes: Negative for visual disturbance.  Respiratory: Negative for cough, choking, chest tightness, shortness of breath and wheezing.   Cardiovascular: Negative for chest pain, palpitations and leg swelling.  Gastrointestinal: Negative for abdominal distention, abdominal pain, constipation, diarrhea,  nausea and vomiting.  Endocrine: Negative for polydipsia, polyphagia and polyuria.  Genitourinary: Negative for dysuria, flank pain, hematuria and urgency.  Musculoskeletal: Negative for back pain, gait problem, myalgias and neck pain.  Skin: Negative for pallor, rash and wound.  Neurological: Negative for seizures, syncope, weakness, numbness and headaches.  Psychiatric/Behavioral: Negative for confusion and dysphoric mood.    Objective:    BP 140/89   Pulse 66   Ht 5\' 11"  (1.803 m)   Wt 183 lb (83 kg)   BMI 25.52 kg/m   Wt Readings from Last 3 Encounters:  12/30/18 183 lb (83 kg)  10/10/18 183 lb 10.3 oz (83.3 kg)  10/07/18 183 lb 11.2 oz (83.3 kg)    Physical Exam  Constitutional: He is oriented to person, place, and time. He appears well-developed and well-nourished. He is cooperative. No distress.  HENT:  Head: Normocephalic and atraumatic.  Eyes: EOM are normal.  Neck: Normal range of motion. Neck supple. No tracheal deviation present. No thyromegaly present.  Cardiovascular: Normal rate, S1 normal and S2 normal. Exam reveals no gallop.  No murmur heard. Pulses:      Dorsalis pedis pulses are 1+ on the right side and 1+ on the left side.       Posterior tibial pulses are 1+ on the right side and 1+ on the left side.  Pulmonary/Chest: Effort normal. No respiratory distress. He has no wheezes.  Abdominal: He exhibits no distension. There is no abdominal tenderness. There is no guarding and no CVA tenderness.  Musculoskeletal:        General: No edema.     Right shoulder: He exhibits no swelling and no deformity.  Neurological: He is alert and oriented to person, place, and time. He has normal strength and normal reflexes. No cranial nerve deficit or sensory deficit. Gait normal.  Skin: Skin is warm and dry. No rash noted. No cyanosis. Nails show no clubbing.  Psychiatric: He has a normal mood and affect. His speech is normal and behavior is normal. Judgment and thought  content normal. Cognition and memory are normal.   Recent Results (from the past 2160 hour(s))  Glucose, capillary     Status: Abnormal   Collection Time: 10/07/18  9:52 AM  Result Value Ref Range   Glucose-Capillary 182 (H) 70 - 99 mg/dL   Comment 1 Notify RN    Comment 2 Document in Chart   Basic metabolic panel     Status: Abnormal   Collection Time: 10/07/18 10:06 AM  Result Value Ref Range   Sodium 140 135 - 145 mmol/L   Potassium 4.0 3.5 - 5.1  mmol/L   Chloride 104 98 - 111 mmol/L   CO2 28 22 - 32 mmol/L   Glucose, Bld 174 (H) 70 - 99 mg/dL   BUN 16 8 - 23 mg/dL   Creatinine, Ser 1.52 (H) 0.61 - 1.24 mg/dL   Calcium 9.0 8.9 - 10.3 mg/dL   GFR calc non Af Amer 46 (L) >60 mL/min   GFR calc Af Amer 54 (L) >60 mL/min   Anion gap 8 5 - 15    Comment: Performed at [facility] [address] [place], [place] [address]  CBC     Status: None   Collection Time: 10/07/18 10:06 AM  Result Value Ref Range   WBC 6.1 4.0 - 10.5 K/uL   RBC 4.56 4.22 - 5.81 MIL/uL   Hemoglobin 13.4 13.0 - 17.0 g/dL   HCT 41.1 39.0 - 52.0 %   MCV 90.1 80.0 - 100.0 fL   MCH 29.4 26.0 - 34.0 pg   MCHC 32.6 30.0 - 36.0 g/dL   RDW 12.3 11.5 - 15.5 %   Platelets 172 150 - 400 K/uL   nRBC 0.0 0.0 - 0.2 %    Comment: Performed at [facility], [place] [address] [place], [place] [address]  Hemoglobin A1c     Status: Abnormal   Collection Time: 10/07/18 10:06 AM  Result Value Ref Range   Hgb A1c MFr Bld 6.6 (H) 4.8 - 5.6 %    Comment: (NOTE)         Prediabetes: 5.7 - 6.4         Diabetes: >6.4         Glycemic control for adults with diabetes: <7.0    Mean Plasma Glucose 143 mg/dL    Comment: (NOTE) Performed At: [facility] [place], [place] [id number] Rush Farmer MD [id number]   Surgical pcr screen     Status: Abnormal   Collection Time: 10/07/18 10:07 AM   Specimen: Nasal Mucosa; Nasal Swab  Result Value Ref Range   MRSA, PCR NEGATIVE NEGATIVE    Staphylococcus aureus POSITIVE (A) NEGATIVE    Comment: (NOTE) The Xpert SA Assay (FDA approved for NASAL specimens in patients 34 years of age and older), is one component of a comprehensive surveillance program. It is not intended to diagnose infection nor to guide or monitor treatment. Performed at [facility], [place] [address] [place], [place] [address]   Type and screen     Status: None   Collection Time: 10/07/18 10:13 AM  Result Value Ref Range   ABO/RH(D) O POS    Antibody Screen NEG    Sample Expiration 10/21/2018,2359    Extend sample reason      NO TRANSFUSIONS OR PREGNANCY IN THE PAST 3 MONTHS Performed at [facility], [place] [address], [place], [place] [address]   ABO/Rh     Status: None   Collection Time: 10/07/18 10:13 AM  Result Value Ref Range   ABO/RH(D)      O POS Performed at [facility] [address] [place], [place] [address]   Novel Coronavirus, NAA (Hosp order, Send-out to Ref Lab; TAT 18-24 hrs     Status: None   Collection Time: 10/07/18 10:48 AM   Specimen: Nasopharyngeal Swab; Respiratory  Result Value Ref Range   SARS-CoV-2, NAA NOT DETECTED NOT DETECTED    Comment: (NOTE) This nucleic acid amplification test was developed and its performance characteristics determined by  [employer]. Nucleic acid amplification tests include PCR and TMA. This test has not been FDA cleared or approved. This test has been authorized by FDA under an Emergency Use Authorization (EUA). This test is only authorized for the duration of time the declaration that circumstances exist justifying the authorization of the emergency use of in vitro diagnostic tests for detection of SARS-CoV-2 virus and/or diagnosis of COVID-19 infection under section 564(b)(1) of the Act, 21 U.S.C. [id number]) (1), unless the authorization is terminated or revoked sooner. When diagnostic testing is negative, the possibility of a false negative result should be  considered in the context of a patient's recent exposures and the presence of clinical signs and symptoms consistent with COVID-19. An individual without symptoms of COVID- 19 and who is not shedding SARS-CoV-2 vi rus would expect to have a negative (not detected) result in this assay. Performed At: [facility] [place], [place] [id number] Rush Farmer MD [id number]    Coronavirus Source NASOPHARYNGEAL     Comment: Performed at [facility], [place] [address] [place], [place] [address]  Glucose, capillary     Status: Abnormal   Collection Time: 10/10/18 10:11 AM  Result Value Ref Range   Glucose-Capillary 107 (H) 70 - 99 mg/dL  Glucose, capillary     Status: None   Collection Time: 10/10/18 12:32 PM  Result Value Ref Range   Glucose-Capillary 99 70 - 99 mg/dL  Glucose, capillary     Status: Abnormal   Collection Time: 10/10/18  4:04 PM  Result Value Ref Range   Glucose-Capillary 127 (H) 70 - 99 mg/dL   Comment 1 Notify RN    Comment 2 Document in Chart   Glucose, capillary     Status: Abnormal   Collection Time: 10/10/18  5:07 PM  Result Value Ref Range   Glucose-Capillary 195 (H) 70 - 99 mg/dL  Glucose, capillary     Status: Abnormal   Collection Time: 10/10/18  9:48 PM  Result Value Ref Range   Glucose-Capillary 218 (H) 70 - 99 mg/dL   Comment 1 Notify RN    Comment 2 Document in Chart   Glucose, capillary     Status: Abnormal   Collection Time: 10/11/18  6:20 AM  Result Value Ref Range   Glucose-Capillary 167 (H) 70 - 99 mg/dL   Comment 1 Notify RN    Comment 2 Document in Chart   CBC and differential     Status: Abnormal   Collection Time: 12/01/18 12:00 AM  Result Value Ref Range   Hemoglobin 12.3 (A) 13.5 - 17.5   HCT 38 (A) 41 - 53   Platelets 201 150 - 399   WBC 6.4   CBC     Status: None   Collection Time: 12/01/18 12:00 AM  Result Value Ref Range   RBC 4.45 3.87 - [id number]  Basic metabolic panel     Status: Abnormal    Collection Time: 12/01/18 12:00 AM  Result Value Ref Range   Glucose 132    BUN 21 4 - 21   CO2 31 (A) 13 - 22   Creatinine 1.3 0.6 - 1.3   Potassium 4.1 3.4 - 5.3   Sodium 140 137 - 147   Chloride 103 99 - 108  Comprehensive metabolic panel     Status: None   Collection Time: 12/01/18 12:00 AM  Result Value Ref Range   Calcium 9.3 8.7 - 10.7   Albumin 3.9 3.5 - 5.0  Hepatic function panel     Status: None   Collection Time: 12/01/18 12:00 AM  Result Value Ref Range   Alkaline Phosphatase 80 25 - 125   ALT 21 10 - 40   AST 18 14 - 40   Bilirubin, Total 1.0   VITAMIN D 25 Hydroxy (Vit-D Deficiency, Fractures)     Status: None   Collection Time: 12/01/18 12:00 AM  Result Value Ref Range   Vit D, 25-Hydroxy 41   TSH     Status: None   Collection Time: 12/01/18 12:00 AM  Result Value Ref Range   TSH 1.70 0.41 - 5.90    Comment: T4 total - 5.1, T3 Uptake - 34  HgB A1c     Status: Abnormal   Collection Time: 12/30/18  9:11 AM  Result Value Ref Range   Hemoglobin A1C 6.3 (A) 4.0 - 5.6 %   HbA1c POC (<> result, manual entry)     HbA1c, POC (prediabetic range)     HbA1c, POC (controlled diabetic range)     Lipid Panel     Component Value Date/Time   CHOL 163 07/21/2018   TRIG 111 07/21/2018   HDL 53 07/21/2018   CHOLHDL 3.9 04/25/2016 1119   VLDL 27 04/25/2016 1119   LDLCALC 89 07/21/2018     Assessment & Plan:   1.  type 2 diabetes mellitus  - Patient has currently controlled asymptomatic type 2 DM since  68 years of age. -Status post Roux-en-Y gastric bypass, lost approximately 100 pounds due to his surgery.    -His point-of-care A1c today 6.3%.    - His diabetes is complicated by mild  CKD  and patient remains at a high risk for more acute and chronic complications of diabetes which include CAD, CVA, CKD, retinopathy, and neuropathy. These are all discussed in detail with the patient.  - I have counseled the patient on diet management and weight loss, by adopting  a carbohydrate restricted/protein rich diet. - he  admits there is a room for improvement in his diet and drink choices. -  Suggestion is made for him to avoid simple carbohydrates  from his diet including Cakes, Sweet Desserts / Pastries, Ice Cream, Soda (diet and regular), Sweet Tea, Candies, Chips, Cookies, Sweet Pastries,  Store Bought Juices, Alcohol in Excess of  1-2 drinks a day, Artificial Sweeteners, Coffee Creamer, and "Sugar-free" Products. This will help patient to have stable blood glucose profile and potentially avoid unintended weight gain.   - I encouraged the patient to switch to  unprocessed or minimally processed complex starch and increased protein intake (animal or plant source), fruits, and vegetables.  - Patient is advised to stick to a routine mealtimes to eat 3 meals  a day and avoid unnecessary snacks ( to snack only to correct hypoglycemia).   - I have approached patient with the following individualized plan to manage diabetes and patient agrees:   -His labs are consistent with mild elevation of his serum creatinine.  He is advised to lower her metformin to 500 mg p.o. once after breakfast.   -He is not affording Trulicity.  He is given samples of Ozempic from clinic 0.5 mg subcutaneously weekly.      - Patient specific target  A1c;  LDL, HDL, Triglycerides, and  Waist Circumference were discussed in detail.  2) BP/HTN: His blood pressure is controlled to target.  He is advised to continue on olmesartan/hydrochlorothiazide .  3) Lipids/HPL: Recent lipid panel showed improved  LDL at 89.  He is advised to continue atorvastatin 20 mg p.o. nightly.     4)  Weight/Diet: Status post Roux-en-Y gastric bypass, lost more than 100 pounds overall. CDE Consult in progress , exercise, and detailed carbohydrates information provided.  5) Chronic Care/Health Maintenance:  -Patient is on ACEI/ARB and Statin medications and encouraged to continue to follow up with Ophthalmology,  Podiatrist at least yearly or according to recommendations, and advised to   stay away from smoking. I have recommended yearly flu vaccine and pneumonia vaccination at least every 5 years; moderate intensity exercise for up to 150 minutes weekly; and  sleep for at least 7 hours a day.  -He has requested a refill on his ondansetron given to him on Friday surgery due to nausea/vomiting.  I gave him refills for ondansetron 4 mg p.o.  PRN, not more than 3 times a day.     - I advised patient to maintain close follow up with Vasireddy, Lanetta Inch, MD for primary care needs.  - Time spent with the patient: 25 min, of which >50% was spent in reviewing his blood glucose logs , discussing his hypoglycemia and hyperglycemia episodes, reviewing his current and  previous labs / studies and medications  doses and developing a plan to avoid hypoglycemia and hyperglycemia. Please refer to Patient Instructions for Blood Glucose Monitoring and Insulin/Medications Dosing Guide"  in media tab for additional information. Please  also refer to " Patient Self Inventory" in the Media  tab for reviewed elements of pertinent patient history.  [place] participated in the discussions, expressed understanding, and voiced agreement with the above plans.  All questions were answered to his satisfaction. he is encouraged to contact clinic should he have any questions or concerns prior to his return visit.   Follow up plan: In 6 months with labs. Glade Lloyd, MD Phone: [phone]  Fax: [phone]  -  This note was partially dictated with voice recognition software. Similar sounding words can be transcribed inadequately or may not  be corrected upon review.  12/30/2018, 1:24 PM

## 2019-02-06 ENCOUNTER — Ambulatory Visit: Payer: Medicare Other | Admitting: "Endocrinology

## 2019-03-05 ENCOUNTER — Other Ambulatory Visit: Payer: Self-pay | Admitting: "Endocrinology

## 2019-04-14 ENCOUNTER — Encounter: Payer: Self-pay | Admitting: Dermatology

## 2019-04-14 ENCOUNTER — Ambulatory Visit (INDEPENDENT_AMBULATORY_CARE_PROVIDER_SITE_OTHER): Payer: Medicare Other | Admitting: Dermatology

## 2019-04-14 ENCOUNTER — Other Ambulatory Visit: Payer: Self-pay

## 2019-04-14 DIAGNOSIS — L570 Actinic keratosis: Secondary | ICD-10-CM | POA: Diagnosis not present

## 2019-04-14 DIAGNOSIS — Z85828 Personal history of other malignant neoplasm of skin: Secondary | ICD-10-CM | POA: Diagnosis not present

## 2019-04-14 DIAGNOSIS — D239 Other benign neoplasm of skin, unspecified: Secondary | ICD-10-CM

## 2019-04-14 DIAGNOSIS — D2361 Other benign neoplasm of skin of right upper limb, including shoulder: Secondary | ICD-10-CM | POA: Diagnosis not present

## 2019-04-14 DIAGNOSIS — L821 Other seborrheic keratosis: Secondary | ICD-10-CM

## 2019-04-14 NOTE — Patient Instructions (Signed)
Waist up skin examination shows no atypical moles, no recurrent or new nonmelanoma skin cancer. The area over the lip where he had Mohs surgery has a good cosmetic result. There is a crust over the right lip and a hornlike lesion below the right eye which represent actinic keratoses; these were treated with 5-second liquid nitrogen freeze. A 3 mm dermal papule overlying very superficial vein on the right dorsal hand has been stable for several months. This may represent a dermatofibroma, but will be biopsied if there is clinical change.

## 2019-04-16 ENCOUNTER — Other Ambulatory Visit (HOSPITAL_COMMUNITY): Payer: Self-pay | Admitting: Neurosurgery

## 2019-04-16 DIAGNOSIS — S129XXA Fracture of neck, unspecified, initial encounter: Secondary | ICD-10-CM

## 2019-04-18 NOTE — Progress Notes (Addendum)
   Follow-Up Visit   Subjective  Allen [name] is a 69 y.o. male who presents for the following: Follow-up (Patient here for a 6 month follow-up.  Left sideburn and right ear rim treated in August 2020. Both healed well.  Also, had MOHs on left upper lip with Dr. Winifred Olive in June 2020.  Place on right inner knee is still there. States that it does flake at times.  Top of right hand has a red place x3months.  No itching, bleeding or flaking.).  crusts Location: Face Duration: Avril months Quality: Increased size Associated Signs/Symptoms: Modifying Factors:  Severity:  Timing: Context: History of skin cancers  The following portions of the chart were reviewed this encounter and updated as appropriate:     Objective  Well appearing patient in no apparent distress; mood and affect are within normal limits.  A focused examination was performed including head and neck. Relevant physical exam findings are noted in the Assessment and Plan. Leg also examined.   Assessment & Plan  AK (actinic keratosis) (2) Right Upper Cutaneous Lip; Right Malar Cheek  Destruction of lesion - Right Malar Cheek, Right Upper Cutaneous Lip  Destruction method: cryotherapy   Informed consent: discussed and consent obtained   Lesion destroyed using liquid nitrogen: Yes   Region frozen until ice ball extended beyond lesion: Yes   Cryotherapy cycles:  3 Outcome: patient tolerated procedure well with no complications    Dermatofibroma Right Hand - Posterior  Seborrheic keratosis Right Lower Leg - Anterior

## 2019-04-20 LAB — BASIC METABOLIC PANEL
BUN: 29 — AB (ref 4–21)
Creatinine: 1.8 — AB (ref 0.6–1.3)

## 2019-04-20 LAB — TSH: TSH: 1.34 (ref 0.41–5.90)

## 2019-04-20 LAB — HEMOGLOBIN A1C: Hemoglobin A1C: 7.1

## 2019-04-20 LAB — COMPREHENSIVE METABOLIC PANEL: Calcium: 9.5 (ref 8.7–10.7)

## 2019-04-21 LAB — TSH: TSH: 1.34 (ref 0.41–5.90)

## 2019-04-25 ENCOUNTER — Other Ambulatory Visit (HOSPITAL_COMMUNITY)
Admission: RE | Admit: 2019-04-25 | Discharge: 2019-04-25 | Disposition: A | Discharge status: Home / Self Care | Payer: Medicare Other | Source: Ambulatory Visit | Attending: Neurosurgery | Admitting: Neurosurgery

## 2019-04-25 DIAGNOSIS — Z01812 Encounter for preprocedural laboratory examination: Secondary | ICD-10-CM | POA: Insufficient documentation

## 2019-04-25 DIAGNOSIS — Z20822 Contact with and (suspected) exposure to covid-19: Secondary | ICD-10-CM | POA: Diagnosis not present

## 2019-04-25 LAB — SARS CORONAVIRUS 2 (TAT 6-24 HRS): SARS Coronavirus 2: NEGATIVE

## 2019-04-28 ENCOUNTER — Ambulatory Visit (HOSPITAL_COMMUNITY): Payer: Medicare Other | Admitting: Certified Registered"

## 2019-04-28 ENCOUNTER — Encounter (HOSPITAL_COMMUNITY): Payer: Self-pay

## 2019-04-28 ENCOUNTER — Ambulatory Visit (HOSPITAL_COMMUNITY)
Admission: RE | Admit: 2019-04-28 | Discharge: 2019-04-28 | Disposition: A | Discharge status: Home / Self Care | Payer: Medicare Other | Source: Ambulatory Visit | Attending: Neurosurgery | Admitting: Neurosurgery

## 2019-04-28 ENCOUNTER — Encounter (HOSPITAL_COMMUNITY): Admission: RE | Disposition: A | Discharge status: Home / Self Care | Payer: Self-pay | Source: Ambulatory Visit

## 2019-04-28 ENCOUNTER — Other Ambulatory Visit: Payer: Self-pay

## 2019-04-28 DIAGNOSIS — S129XXA Fracture of neck, unspecified, initial encounter: Secondary | ICD-10-CM

## 2019-04-28 DIAGNOSIS — M960 Pseudarthrosis after fusion or arthrodesis: Secondary | ICD-10-CM | POA: Diagnosis present

## 2019-04-28 DIAGNOSIS — M5412 Radiculopathy, cervical region: Secondary | ICD-10-CM | POA: Insufficient documentation

## 2019-04-28 DIAGNOSIS — Z981 Arthrodesis status: Secondary | ICD-10-CM | POA: Insufficient documentation

## 2019-04-28 DIAGNOSIS — Z79899 Other long term (current) drug therapy: Secondary | ICD-10-CM | POA: Insufficient documentation

## 2019-04-28 HISTORY — PX: RADIOLOGY WITH ANESTHESIA: SHX6223

## 2019-04-28 LAB — GLUCOSE, CAPILLARY
Glucose-Capillary: 122 mg/dL — ABNORMAL HIGH (ref 70–99)
Glucose-Capillary: 120 mg/dL — ABNORMAL HIGH (ref 70–99)

## 2019-04-28 LAB — POCT I-STAT, CHEM 8
BUN: 26 mg/dL — ABNORMAL HIGH (ref 8–23)
Calcium, Ion: 1.21 mmol/L (ref 1.15–1.40)
Chloride: 102 mmol/L (ref 98–111)
Creatinine, Ser: 1.7 mg/dL — ABNORMAL HIGH (ref 0.61–1.24)
Glucose, Bld: 120 mg/dL — ABNORMAL HIGH (ref 70–99)
HCT: 42 % (ref 39.0–52.0)
Hemoglobin: 14.3 g/dL (ref 13.0–17.0)
Potassium: 3.4 mmol/L — ABNORMAL LOW (ref 3.5–5.1)
Sodium: 140 mmol/L (ref 135–145)
TCO2: 29 mmol/L (ref 22–32)

## 2019-04-28 SURGERY — RADIOLOGY WITH ANESTHESIA
Anesthesia: General

## 2019-04-28 MED ORDER — FENTANYL CITRATE (PF) 100 MCG/2ML IJ SOLN: 25.0000 ug | INTRAMUSCULAR | Status: DC | PRN

## 2019-04-28 MED ORDER — OXYCODONE HCL 5 MG/5ML PO SOLN: 5.0000 mg | Freq: Once | ORAL | Status: DC | PRN

## 2019-04-28 MED ORDER — OXYCODONE HCL 5 MG PO TABS: 5.0000 mg | ORAL_TABLET | Freq: Once | ORAL | Status: DC | PRN

## 2019-04-28 MED ORDER — ONDANSETRON HCL 4 MG/2ML IJ SOLN: 4.0000 mg | Freq: Once | INTRAMUSCULAR | Status: DC | PRN

## 2019-04-28 NOTE — Transfer of Care (Signed)
Immediate Anesthesia Transfer of Care Note  Patient: Allen [name]  Procedure(s) Performed: MRI RADIOLOGY WITH ANESTHESIA (N/A )  Patient Location: PACU  Anesthesia Type:General  Level of Consciousness: awake, alert  and oriented  Airway & Oxygen Therapy: Patient connected to face mask oxygen  Post-op Assessment: Post -op Vital signs reviewed and stable  Post vital signs: stable  Last Vitals:  Vitals Value Taken Time  BP 146/76 04/28/19 1003  Temp    Pulse 71 04/28/19 1004  Resp 23 04/28/19 1004  SpO2 92 % 04/28/19 1004  Vitals shown include unvalidated device data.  Last Pain:  Vitals:   04/28/19 0746  PainSc: 6       Patients Stated Pain Goal: 0 ([id number])  Complications: No apparent anesthesia complications

## 2019-04-28 NOTE — Anesthesia Procedure Notes (Addendum)
Procedure Name: Intubation Date/Time: 04/28/2019 9:10 AM Performed by: Lavell Luster, CRNA Pre-anesthesia Checklist: Patient identified, Emergency Drugs available, Suction available, Patient being monitored and Timeout performed Patient Re-evaluated:Patient Re-evaluated prior to induction Oxygen Delivery Method: Circle system utilized Preoxygenation: Pre-oxygenation with 100% oxygen Induction Type: IV induction Ventilation: Mask ventilation without difficulty Laryngoscope Size: Mac and 4 Grade View: Grade I Tube type: Oral Tube size: 7.5 mm Number of attempts: 1 Airway Equipment and Method: Stylet and Video-laryngoscopy Placement Confirmation: positive ETCO2,  breath sounds checked- equal and bilateral and ETT inserted through vocal cords under direct vision Secured at: 22 cm Tube secured with: Tape Dental Injury: Teeth and Oropharynx as per pre-operative assessment  Difficulty Due To: Difficulty was anticipated

## 2019-04-28 NOTE — Anesthesia Preprocedure Evaluation (Addendum)
Anesthesia Evaluation  Patient identified by MRN, date of birth, ID band Patient awake    Reviewed: Allergy & Precautions, NPO status , Patient's Chart, lab work & pertinent test results  Airway Mallampati: II  TM Distance: >3 FB Neck ROM: Limited    Dental  (+) Teeth Intact, Dental Advisory Given   Pulmonary    breath sounds clear to auscultation       Cardiovascular hypertension,  Rhythm:Regular Rate:Normal     Neuro/Psych    GI/Hepatic   Endo/Other  diabetes  Renal/GU      Musculoskeletal   Abdominal   Peds  Hematology   Anesthesia Other Findings   Reproductive/Obstetrics                          Anesthesia Physical Anesthesia Plan  ASA: III  Anesthesia Plan: General   Post-op Pain Management:    Induction: Intravenous  PONV Risk Score and Plan: Ondansetron  Airway Management Planned: Oral ETT  Additional Equipment:   Intra-op Plan:   Post-operative Plan: Post-operative intubation/ventilation  Informed Consent: I have reviewed the patients History and Physical, chart, labs and discussed the procedure including the risks, benefits and alternatives for the proposed anesthesia with the patient or authorized representative who has indicated his/her understanding and acceptance.     Dental advisory given  Plan Discussed with: CRNA and Anesthesiologist  Anesthesia Plan Comments: (Plan GA with oral ETT  Roberts Gaudy)       Anesthesia Quick Evaluation

## 2019-04-28 NOTE — Anesthesia Postprocedure Evaluation (Signed)
Anesthesia Post Note  Patient: [place]  Procedure(s) Performed: MRI RADIOLOGY WITH ANESTHESIA (N/A )     Patient location during evaluation: PACU Anesthesia Type: General Level of consciousness: awake and alert Pain management: pain level controlled Vital Signs Assessment: post-procedure vital signs reviewed and stable Respiratory status: spontaneous breathing, nonlabored ventilation, respiratory function stable and patient connected to nasal cannula oxygen Cardiovascular status: blood pressure returned to baseline and stable Postop Assessment: no apparent nausea or vomiting Anesthetic complications: no    Last Vitals:  Vitals:   04/28/19 1015 04/28/19 1030  BP: (!) 143/95 (!) 147/81  Pulse: 73 73  Resp: 10 11  Temp:  (!) 36.3 C  SpO2: 98% 98%    Last Pain:  Vitals:   04/28/19 1030  PainSc: 0-No pain                 Lakeyshia Tuckerman COKER

## 2019-04-29 ENCOUNTER — Encounter: Payer: Self-pay | Admitting: *Deleted

## 2019-04-30 ENCOUNTER — Ambulatory Visit: Payer: Medicare Other | Admitting: "Endocrinology

## 2019-05-04 ENCOUNTER — Other Ambulatory Visit: Payer: Self-pay | Admitting: Neurosurgery

## 2019-05-04 DIAGNOSIS — S129XXA Fracture of neck, unspecified, initial encounter: Secondary | ICD-10-CM

## 2019-05-06 ENCOUNTER — Other Ambulatory Visit (HOSPITAL_COMMUNITY): Payer: Self-pay | Admitting: Neurosurgery

## 2019-05-06 ENCOUNTER — Other Ambulatory Visit: Payer: Self-pay | Admitting: Neurosurgery

## 2019-05-06 DIAGNOSIS — S129XXA Fracture of neck, unspecified, initial encounter: Secondary | ICD-10-CM

## 2019-05-07 ENCOUNTER — Ambulatory Visit: Payer: Medicare Other | Admitting: "Endocrinology

## 2019-05-19 ENCOUNTER — Other Ambulatory Visit: Payer: Self-pay

## 2019-05-19 ENCOUNTER — Ambulatory Visit (INDEPENDENT_AMBULATORY_CARE_PROVIDER_SITE_OTHER): Payer: Medicare Other | Admitting: "Endocrinology

## 2019-05-19 ENCOUNTER — Encounter: Payer: Self-pay | Admitting: "Endocrinology

## 2019-05-19 VITALS — BP 128/81 | HR 65 | Ht 71.0 in | Wt 182.8 lb

## 2019-05-19 DIAGNOSIS — E782 Mixed hyperlipidemia: Secondary | ICD-10-CM

## 2019-05-19 DIAGNOSIS — E1165 Type 2 diabetes mellitus with hyperglycemia: Secondary | ICD-10-CM

## 2019-05-19 DIAGNOSIS — E559 Vitamin D deficiency, unspecified: Secondary | ICD-10-CM

## 2019-05-19 DIAGNOSIS — I10 Essential (primary) hypertension: Secondary | ICD-10-CM | POA: Diagnosis not present

## 2019-05-19 NOTE — Patient Instructions (Signed)

## 2019-05-19 NOTE — Progress Notes (Signed)
05/19/2019   Endocrinology follow-up note   Subjective:    Patient ID: Allen [name], male    DOB: 05/27/1950. Patient is being seen in follow-up for the management of type 2 diabetes.    Vasireddy, Lanetta Inch, MD  Past Medical History:  Diagnosis Date  . Anemia    on iron since gastric bypass  . Arthritis    mild  . Asthma   . Chronic kidney disease    stage 3, now resolved since weight loss  . Claustrophobia   . Diabetes mellitus, type II ([place])    type 2 metformin  . GERD (gastroesophageal reflux disease)    takes nexium  . Hyperlipidemia   . Hypertension   . Pneumonia 15 yrs ago  . SCC (squamous cell carcinoma) 12/29/2007   Left Mid Preauricular(Mod.Diff) (Dr. Lacinda Axon)  . SCC (squamous cell carcinoma) 12/29/2007   Right Upper Dorsal Nose(Well Diff) (Dr. Lacinda Axon)  . SCC (squamous cell carcinoma) 12/29/2007   Anti-Tragus Right Ear(Mod.Diff) (Dr. Lacinda Axon)  . SCC (squamous cell carcinoma) 06/06/2010   Left Nose(in Situ) (curet and 5FU)  . SCC (squamous cell carcinoma) 06/06/2010   Right Check(Well Diff) (curet and 5FU)  . SCC (squamous cell carcinoma) 07/20/2013   Right Sideburn(KA) (curet and 5FU)  . SCC (squamous cell carcinoma) 06/27/2015   Right Neck Sup(in Situ) (curet and 5FU)  . SCC (squamous cell carcinoma) 06/27/2015   Right Neck Inf(in Situ) (curet and 5FU)  . SCC (squamous cell carcinoma) 01/02/2017   Left Wrist(in Situ) (curet and 5FU)  . SCC (squamous cell carcinoma) 01/02/2017   Right Outer Cheek(in Situ) (curet and 5FU)  . SCC (squamous cell carcinoma) 06/23/2018   Left Sideburn(in Situ) (curet and 5FU)  . SCC (squamous cell carcinoma) 06/23/2018   Left Upper Lip(Well Diff) (MOH's)  . SCC (squamous cell carcinoma) 06/23/2018   Right Ear Rim(in Situ) (curet and 5FU)  . Sleep apnea    cpap, no cpap since may 2018 weight loss 80 labs   . Superficial nodular basal cell carcinoma (BCC) 06/27/2015   Left Rim of Ear (curet and 5FU)   Past Surgical  History:  Procedure Laterality Date  . ANTERIOR CERVICAL DECOMP/DISCECTOMY FUSION Right 10/10/2018   Procedure: Right sided revision with hardware removal at Cervical three-four, Cervical four-five Anterior cervical decompression/discectomy/fusion with corpectomy at Cervical five;  Surgeon: Erline Levine, MD;  Location: [facility];  Service: Neurosurgery;  Laterality: Right;  . CARDIAC CATHETERIZATION     patient reports he thinks he had one over 25 years ago  . carpal tunnel Bilateral   . CERVICAL VERTEBRAE EXCISION     and cleaning  . CHOLECYSTECTOMY    . cyst removed left hand  01-28-17  . ESOPHAGOGASTRODUODENOSCOPY (EGD) WITH PROPOFOL N/A 01/31/2017   Procedure: ESOPHAGOGASTRODUODENOSCOPY (EGD) WITH PROPOFOL;  Surgeon: Alphonsa Overall, MD;  Location: [facility];  Service: General;  Laterality: N/A;  . EYE SURGERY     bilateral cataract removal  . GASTRIC ROUX-EN-Y N/A 06/04/2016   Procedure: LAPAROSCOPIC ROUX-EN-Y GASTRIC BYPASS WITH UPPER ENDOSCOPY;  Surgeon: Clovis Riley, MD;  Location: [facility];  Service: General;  Laterality: N/A;  . NASAL SINUS SURGERY    . RADIOLOGY WITH ANESTHESIA N/A 04/28/2019   Procedure: MRI RADIOLOGY WITH ANESTHESIA;  Surgeon: Radiologist, Medication, MD;  Location: [facility];  Service: Radiology;  Laterality: N/A;  . TRIGGER FINGER RELEASE     Social History   Socioeconomic History  . Marital status: Married    Spouse name: Not  on file  . Number of children: Not on file  . Years of education: Not on file  . Highest education level: Not on file  Occupational History  . Not on file  Tobacco Use  . Smoking status: Never Smoker  . Smokeless tobacco: Never Used  Substance and Sexual Activity  . Alcohol use: No  . Drug use: No  . Sexual activity: Yes  Other Topics Concern  . Not on file  Social History Narrative  . Not on file   Social Determinants of Health   Financial Resource Strain:   . Difficulty of Paying Living Expenses:   Food Insecurity:   .  Worried About Charity fundraiser in the Last Year:   . Arboriculturist in the Last Year:   Transportation Needs:   . Film/video editor (Medical):   Marland Kitchen Lack of Transportation (Non-Medical):   Physical Activity:   . Days of Exercise per Week:   . Minutes of Exercise per Session:   Stress:   . Feeling of Stress :   Social Connections:   . Frequency of Communication with Friends and Family:   . Frequency of Social Gatherings with Friends and Family:   . Attends Religious Services:   . Active Member of Clubs or Organizations:   . Attends Archivist Meetings:   Marland Kitchen Marital Status:    Outpatient Encounter Medications as of 05/19/2019  Medication Sig  . acetaminophen (TYLENOL) 500 MG tablet Take 1,000 mg by mouth every 8 (eight) hours as needed for mild pain.  Marland Kitchen amLODipine (NORVASC) 10 MG tablet Take 10 mg by mouth every evening.   . Calcium Carb-Cholecalciferol (CALCIUM 500+D3 PO) Take 1 tablet by mouth 3 (three) times daily.   . Cyanocobalamin (VITAMIN B-12 PO) Take 300 mcg by mouth daily.  Marland Kitchen escitalopram (LEXAPRO) 20 MG tablet Take 20 mg by mouth daily.  Marland Kitchen gabapentin (NEURONTIN) 300 MG capsule Take 300 mg by mouth at bedtime as needed (cough).   Marland Kitchen HYDROcodone-acetaminophen (NORCO/VICODIN) 5-325 MG tablet Take 1-2 tablets by mouth every 4 (four) hours as needed for moderate pain ((score 7 to 10)).  Marland Kitchen levocetirizine (XYZAL) 5 MG tablet Take 5 mg by mouth every evening.  . Magnesium 400 MG CAPS Take 400 mg by mouth daily.   . metFORMIN (GLUCOPHAGE) 500 MG tablet Take 1 tablet (500 mg total) by mouth daily with breakfast.  . metoCLOPramide (REGLAN) 5 MG tablet Take 5 mg by mouth 3 (three) times daily.  . Multiple Vitamin (MULTIVITAMIN WITH MINERALS) TABS tablet Take 1 tablet by mouth daily.  Marland Kitchen olmesartan-hydrochlorothiazide (BENICAR HCT) 40-12.5 MG tablet Take 1 tablet by mouth daily.  Marland Kitchen omeprazole (PRILOSEC) 20 MG capsule Take 20 mg by mouth daily as needed for heartburn.  .  ondansetron (ZOFRAN) 4 MG tablet Take 1 tablet (4 mg total) by mouth every 8 (eight) hours as needed. (Patient taking differently: Take 4 mg by mouth every 8 (eight) hours as needed for nausea or vomiting. )  . Semaglutide (OZEMPIC, 0.25 OR 0.5 MG/DOSE, George) Inject 0.5 mg into the skin once a week.  . [DISCONTINUED] baclofen (LIORESAL) 10 MG tablet Take 10 mg by mouth 3 (three) times daily as needed for pain.  . [DISCONTINUED] mometasone (NASONEX) 50 MCG/ACT nasal spray Place 2 sprays into the nose daily as needed (congestion).    No facility-administered encounter medications on file as of 05/19/2019.   ALLERGIES: No Known Allergies VACCINATION STATUS: Immunization History  Administered Date(s)  Administered  . Influenza-Unspecified 10/22/2017    Diabetes He presents for his follow-up diabetic visit. He has type 2 diabetes mellitus. Onset time: He was diagnosed at approximate age of 50 years. His disease course has been stable. There are no hypoglycemic associated symptoms. Pertinent negatives for hypoglycemia include no confusion, headaches, pallor or seizures. Pertinent negatives for diabetes include no chest pain, no polydipsia, no polyphagia, no polyuria and no weakness. There are no hypoglycemic complications. Symptoms are stable. Diabetic complications include nephropathy. Risk factors for coronary artery disease include diabetes mellitus, dyslipidemia, hypertension and male sex. His weight is decreasing steadily (He is status post Roux-en-Y gastric bypass- he has lost pprox 100 kbs overall.). He is following a diabetic diet. When asked about meal planning, he reported none. He has not had a previous visit with a dietitian. He participates in exercise intermittently (He participates in golfing activities.). His home blood glucose trend is fluctuating minimally. An ACE inhibitor/angiotensin II receptor blocker is being taken. Eye exam is current.  Hyperlipidemia This is a chronic problem. The  current episode started more than 1 year ago. The problem is uncontrolled. Exacerbating diseases include chronic renal disease and diabetes. He has no history of obesity. Pertinent negatives include no chest pain, myalgias or shortness of breath. Current antihyperlipidemic treatment includes statins and bile acid squestrants. Compliance problems include adherence to diet.  Risk factors for coronary artery disease include diabetes mellitus, dyslipidemia, hypertension, male sex, obesity and a sedentary lifestyle.  Hypertension This is a chronic problem. The current episode started more than 1 year ago. The problem is uncontrolled. Pertinent negatives include no chest pain, headaches, neck pain, palpitations or shortness of breath. Risk factors for coronary artery disease include diabetes mellitus, dyslipidemia and sedentary lifestyle. Past treatments include angiotensin blockers. Hypertensive end-organ damage includes kidney disease. Identifiable causes of hypertension include chronic renal disease.     Review of Systems  Constitutional: Negative for chills, fever and unexpected weight change.  HENT: Negative for dental problem, mouth sores and trouble swallowing.   Eyes: Negative for visual disturbance.  Respiratory: Negative for cough, choking, chest tightness, shortness of breath and wheezing.   Cardiovascular: Negative for chest pain, palpitations and leg swelling.  Gastrointestinal: Negative for abdominal distention, abdominal pain, constipation, diarrhea, nausea and vomiting.  Endocrine: Negative for polydipsia, polyphagia and polyuria.  Genitourinary: Negative for dysuria, flank pain, hematuria and urgency.  Musculoskeletal: Negative for back pain, gait problem, myalgias and neck pain.  Skin: Negative for pallor, rash and wound.  Neurological: Negative for seizures, syncope, weakness, numbness and headaches.  Psychiatric/Behavioral: Negative for confusion and dysphoric mood.    Objective:     BP 128/81   Pulse 65   Ht 5\' 11"  (1.803 m)   Wt 182 lb 12.8 oz (82.9 kg)   BMI 25.50 kg/m   Wt Readings from Last 3 Encounters:  05/19/19 182 lb 12.8 oz (82.9 kg)  04/28/19 183 lb (83 kg)  12/30/18 183 lb (83 kg)    Physical Exam  Constitutional: He is oriented to person, place, and time. He appears well-developed and well-nourished. He is cooperative. No distress.  HENT:  Head: Normocephalic and atraumatic.  Eyes: EOM are normal.  Neck: No tracheal deviation present. No thyromegaly present.  Cardiovascular: Normal rate, S1 normal and S2 normal. Exam reveals no gallop.  No murmur heard. Pulses:      Dorsalis pedis pulses are 1+ on the right side and 1+ on the left side.  Posterior tibial pulses are 1+ on the right side and 1+ on the left side.  Pulmonary/Chest: Effort normal. No respiratory distress. He has no wheezes.  Abdominal: He exhibits no distension. There is no abdominal tenderness. There is no guarding and no CVA tenderness.  Musculoskeletal:        General: No edema.     Right shoulder: No swelling or deformity.     Cervical back: Normal range of motion and neck supple.  Neurological: He is alert and oriented to person, place, and time. He has normal strength and normal reflexes. No cranial nerve deficit or sensory deficit. Gait normal.  Skin: Skin is warm and dry. No rash noted. No cyanosis. Nails show no clubbing.  Psychiatric: He has a normal mood and affect. His speech is normal and behavior is normal. Judgment and thought content normal. Cognition and memory are normal.   Recent Results (from the past 2160 hour(s))  Hemoglobin A1c     Status: None   Collection Time: 04/20/19 12:00 AM  Result Value Ref Range   Hemoglobin A1C 7.1   TSH     Status: None   Collection Time: 04/20/19 12:00 AM  Result Value Ref Range   TSH 1.34 0.41 - [id number]  Basic metabolic panel     Status: Abnormal   Collection Time: 04/20/19 12:00 AM  Result Value Ref Range   BUN 29 (A)  4 - 21   Creatinine 1.8 (A) 0.6 - 1.3  Comprehensive metabolic panel     Status: None   Collection Time: 04/20/19 12:00 AM  Result Value Ref Range   Calcium 9.5 8.7 - 10.7  TSH     Status: None   Collection Time: 04/20/19 12:00 AM  Result Value Ref Range   TSH 1.34 0.41 - 5.90    Comment: free  t4 1.0  SARS CORONAVIRUS 2 (TAT 6-24 HRS) Nasopharyngeal Nasopharyngeal Swab     Status: None   Collection Time: 04/25/19  1:54 PM   Specimen: Nasopharyngeal Swab  Result Value Ref Range   SARS Coronavirus 2 NEGATIVE NEGATIVE    Comment: (NOTE) SARS-CoV-2 target nucleic acids are NOT DETECTED. The SARS-CoV-2 RNA is generally detectable in upper and lower respiratory specimens during the acute phase of infection. Negative results do not preclude SARS-CoV-2 infection, do not rule out co-infections with other pathogens, and should not be used as the sole basis for treatment or other patient management decisions. Negative results must be combined with clinical observations, patient history, and epidemiological information. The expected result is Negative. Fact Sheet for Patients: SugarRoll.be Fact Sheet for Healthcare Providers: https://www.woods-mathews.com/ This test is not yet approved or cleared by the [place] FDA and  has been authorized for detection and/or diagnosis of SARS-CoV-2 by FDA under an Emergency Use Authorization (EUA). This EUA will remain  in effect (meaning this test can be used) for the duration of the COVID-19 declaration under Section 56 4(b)(1) of the Act, 21 U.S.C. section 360bbb-3(b)(1), unless the authorization is terminated or revoked sooner. Performed at [facility], [place] [address], [place], [place] [address]   Glucose, capillary     Status: Abnormal   Collection Time: 04/28/19  7:39 AM  Result Value Ref Range   Glucose-Capillary 122 (H) 70 - 99 mg/dL    Comment: Glucose reference range applies only to  samples taken after fasting for at least 8 hours.  I-STAT, chem 8     Status: Abnormal   Collection Time: 04/28/19  7:51 AM  Result Value Ref Range   Sodium 140 135 - 145 mmol/L   Potassium 3.4 (L) 3.5 - 5.1 mmol/L   Chloride 102 98 - 111 mmol/L   BUN 26 (H) 8 - 23 mg/dL   Creatinine, Ser 1.70 (H) 0.61 - 1.24 mg/dL   Glucose, Bld 120 (H) 70 - 99 mg/dL    Comment: Glucose reference range applies only to samples taken after fasting for at least 8 hours.   Calcium, Ion 1.21 1.15 - 1.40 mmol/L   TCO2 29 22 - 32 mmol/L   Hemoglobin 14.3 13.0 - 17.0 g/dL   HCT 42.0 39.0 - 52.0 %  Glucose, capillary     Status: Abnormal   Collection Time: 04/28/19 10:05 AM  Result Value Ref Range   Glucose-Capillary 120 (H) 70 - 99 mg/dL    Comment: Glucose reference range applies only to samples taken after fasting for at least 8 hours.   Lipid Panel     Component Value Date/Time   CHOL 163 07/21/2018 0000   TRIG 111 07/21/2018 0000   HDL 53 07/21/2018 0000   CHOLHDL 3.9 04/25/2016 1119   VLDL 27 04/25/2016 1119   LDLCALC 89 07/21/2018 0000     Assessment & Plan:   1.  type 2 diabetes mellitus  - Patient has currently controlled asymptomatic type 2 DM since  69 years of age. -Status post Roux-en-Y gastric bypass, lost approximately 100 pounds due to his surgery.    -His point-of-care A1c today 7.1%  increasing from 6.3%.  He did have exposure to steroids since last visit as well as bouts of sinus infections.    - His diabetes is complicated by mild  CKD  and patient remains at a high risk for more acute and chronic complications of diabetes which include CAD, CVA, CKD, retinopathy, and neuropathy. These are all discussed in detail with the patient.  - I have counseled the patient on diet management and weight loss, by adopting a carbohydrate restricted/protein rich diet.  - he  admits there is a room for improvement in his diet and drink choices. -  Suggestion is made for him to avoid simple  carbohydrates  from his diet including Cakes, Sweet Desserts / Pastries, Ice Cream, Soda (diet and regular), Sweet Tea, Candies, Chips, Cookies, Sweet Pastries,  Store Bought Juices, Alcohol in Excess of  1-2 drinks a day, Artificial Sweeteners, Coffee Creamer, and "Sugar-free" Products. This will help patient to have stable blood glucose profile and potentially avoid unintended weight gain.   - I encouraged the patient to switch to  unprocessed or minimally processed complex starch and increased protein intake (animal or plant source), fruits, and vegetables.  - Patient is advised to stick to a routine mealtimes to eat 3 meals  a day and avoid unnecessary snacks ( to snack only to correct hypoglycemia).   - I have approached patient with the following individualized plan to manage diabetes and patient agrees:   -His labs are consistent with mild elevation of his serum creatinine.  He is advised to continue Metformin 500 mg p.o. twice daily after breakfast , continue Ozempic 0.5 mg subcutaneously weekly.  2 pens samples were given for him from clinic.     - Patient specific target  A1c;  LDL, HDL, Triglycerides, and  Waist Circumference were discussed in detail.  2) BP/HTN: -His blood pressure is controlled to target. He is advised to continue on olmesartan/hydrochlorothiazide .  3) Lipids/HPL: Recent lipid panel  showed improved LDL at 89.  He is advised to continue atorvastatin 20 mg p.o. nightly.     4)  Weight/Diet: Status post Roux-en-Y gastric bypass, lost more than 100 pounds overall. CDE Consult in progress , exercise, and detailed carbohydrates information provided.  5) Chronic Care/Health Maintenance:  -Patient is on ACEI/ARB and Statin medications and encouraged to continue to follow up with Ophthalmology, Podiatrist at least yearly or according to recommendations, and advised to   stay away from smoking. I have recommended yearly flu vaccine and pneumonia vaccination at least  every 5 years; moderate intensity exercise for up to 150 minutes weekly; and  sleep for at least 7 hours a day.  -He has requested a refill on his ondansetron given to him on Friday surgery due to nausea/vomiting.  I gave him refills for ondansetron 4 mg p.o.  PRN, not more than 3 times a day.     - I advised patient to maintain close follow up with Vasireddy, Lanetta Inch, MD for primary care needs.  - Time spent on this patient care encounter:  35 min, of which > 50% was spent in  counseling and the rest reviewing his blood glucose logs , discussing his hypoglycemia and hyperglycemia episodes, reviewing his current and  previous labs / studies  ( including abstraction from other facilities) and medications  doses and developing a  long term treatment plan and documenting his care.   Please refer to Patient Instructions for Blood Glucose Monitoring and Insulin/Medications Dosing Guide"  in media tab for additional information. Please  also refer to " Patient Self Inventory" in the Media  tab for reviewed elements of pertinent patient history.  Allen [name] participated in the discussions, expressed understanding, and voiced agreement with the above plans.  All questions were answered to his satisfaction. he is encouraged to contact clinic should he have any questions or concerns prior to his return visit.    Follow up plan: In 6 months with labs. Glade Lloyd, MD Phone: [phone]  Fax: [phone]  -  This note was partially dictated with voice recognition software. Similar sounding words can be transcribed inadequately or may not  be corrected upon review.  05/19/2019, 4:03 PM

## 2019-05-20 ENCOUNTER — Other Ambulatory Visit: Payer: Medicare Other

## 2019-06-02 ENCOUNTER — Other Ambulatory Visit (HOSPITAL_COMMUNITY): Payer: Medicare Other

## 2019-06-02 ENCOUNTER — Inpatient Hospital Stay (HOSPITAL_COMMUNITY): Admission: RE | Admit: 2019-06-02 | Payer: Medicare Other | Source: Ambulatory Visit

## 2019-06-04 NOTE — Pre-Procedure Instructions (Signed)
[id number] [address], [place] [address] Phone: [phone] Fax: [phone]      Your procedure is scheduled on Monday, May 17th.  Report to [facility] Main Entrance "A" at 5:30 A.M., and check in at the Admitting office.  Call this number if you have problems the morning of surgery:  [phone]  Call [phone] if you have any questions prior to your surgery date Monday-Friday 8am-4pm    Remember:  Do not eat or drink after midnight the night before your surgery    Take these medicines the morning of surgery with A SIP OF WATER  escitalopram (LEXAPRO) levocetirizine (XYZAL) metoCLOPramide (REGLAN)  As needed: ondansetron (ZOFRAN), acetaminophen (TYLENOL), or HYDROcodone-acetaminophen (NORCO/VICODIN).   As of today, STOP taking any Aspirin (unless otherwise instructed by your surgeon) and Aspirin containing products, Aleve, Naproxen, Ibuprofen, Motrin, Advil, Goody's, BC's, all herbal medications, fish oil, and all vitamins.   WHAT DO I DO ABOUT MY DIABETES MEDICATION?   Marland Kitchen Do not take metFORMIN (GLUCOPHAGE) the morning of surgery.  . The day of surgery, do not take other diabetes injectables. Take your usual dose of Semaglutide (OZEMPIC) on Sunday, 06/07/19.    HOW TO MANAGE YOUR DIABETES BEFORE AND AFTER SURGERY  Why is it important to control my blood sugar before and after surgery? . Improving blood sugar levels before and after surgery helps healing and can limit problems. . A way of improving blood sugar control is eating a healthy diet by: o  Eating less sugar and carbohydrates o  Increasing activity/exercise o  Talking with your doctor about reaching your blood sugar goals . High blood sugars (greater than 180 mg/dL) can raise your risk of infections and slow your recovery, so you will need to focus on controlling your diabetes during the weeks before surgery. . Make sure that  the doctor who takes care of your diabetes knows about your planned surgery including the date and location.  How do I manage my blood sugar before surgery? . Check your blood sugar at least 4 times a day, starting 2 days before surgery, to make sure that the level is not too high or low. . Check your blood sugar the morning of your surgery when you wake up and every 2 hours until you get to the Short Stay unit. o If your blood sugar is less than 70 mg/dL, you will need to treat for low blood sugar: - Do not take insulin. - Treat a low blood sugar (less than 70 mg/dL) with  cup of clear juice (cranberry or apple), 4 glucose tablets, OR glucose gel. - Recheck blood sugar in 15 minutes after treatment (to make sure it is greater than 70 mg/dL). If your blood sugar is not greater than 70 mg/dL on recheck, call [phone] for further instructions. . Report your blood sugar to the short stay nurse when you get to Short Stay.  . If you are admitted to the hospital after surgery: o Your blood sugar will be checked by the staff and you will probably be given insulin after surgery (instead of oral diabetes medicines) to make sure you have good blood sugar levels. o The goal for blood sugar control after surgery is 80-180 mg/dL.                   Do not wear jewelry.  Do not wear lotions, powders, colognes, or deodorant.            Men may shave face and neck.            Do not bring valuables to the hospital.            [facility] is not responsible for any belongings or valuables.  Do NOT Smoke (Tobacco/Vapping) or drink Alcohol 24 hours prior to your procedure If you use a CPAP at night, you may bring all equipment for your overnight stay.   Contacts, glasses, dentures or bridgework may not be worn into surgery.      For patients admitted to the hospital, discharge time will be determined by your treatment team.   Patients discharged the day of surgery will not be allowed to  drive home, and someone needs to stay with them for 24 hours.    Special instructions:   [place]- Preparing For Surgery  Before surgery, you can play an important role. Because skin is not sterile, your skin needs to be as free of germs as possible. You can reduce the number of germs on your skin by washing with CHG (chlorahexidine gluconate) Soap before surgery.  CHG is an antiseptic cleaner which kills germs and bonds with the skin to continue killing germs even after washing.    Oral Hygiene is also important to reduce your risk of infection.  Remember - BRUSH YOUR TEETH THE MORNING OF SURGERY WITH YOUR REGULAR TOOTHPASTE  Please do not use if you have an allergy to CHG or antibacterial soaps. If your skin becomes reddened/irritated stop using the CHG.  Do not shave (including legs and underarms) for at least 48 hours prior to first CHG shower. It is OK to shave your face.  Please follow these instructions carefully.   1. Shower the NIGHT BEFORE SURGERY and the MORNING OF SURGERY with CHG Soap.   2. If you chose to wash your hair, wash your hair first as usual with your normal shampoo.  3. After you shampoo, rinse your hair and body thoroughly to remove the shampoo.  4. Use CHG as you would any other liquid soap. You can apply CHG directly to the skin and wash gently with a scrungie or a clean washcloth.   5. Apply the CHG Soap to your body ONLY FROM THE NECK DOWN.  Do not use on open wounds or open sores. Avoid contact with your eyes, ears, mouth and genitals (private parts). Wash Face and genitals (private parts)  with your normal soap.   6. Wash thoroughly, paying special attention to the area where your surgery will be performed.  7. Thoroughly rinse your body with warm water from the neck down.  8. DO NOT shower/wash with your normal soap after using and rinsing off the CHG Soap.  9. Pat yourself dry with a CLEAN TOWEL.  10. Wear CLEAN PAJAMAS to bed the night before  surgery, wear comfortable clothes the morning of surgery  11. Place CLEAN SHEETS on your bed the night of your first shower and DO NOT SLEEP WITH PETS.   Day of Surgery:   Do not apply any deodorants/lotions.  Please wear clean clothes to the hospital/surgery center.   Remember to brush your teeth WITH YOUR REGULAR TOOTHPASTE.   Please read over the following fact sheets that you were given.

## 2019-06-05 ENCOUNTER — Other Ambulatory Visit (HOSPITAL_COMMUNITY)
Admission: RE | Admit: 2019-06-05 | Discharge: 2019-06-05 | Disposition: A | Discharge status: Home / Self Care | Payer: Medicare Other | Source: Ambulatory Visit | Attending: Neurosurgery | Admitting: Neurosurgery

## 2019-06-05 ENCOUNTER — Encounter (HOSPITAL_COMMUNITY)
Admission: RE | Admit: 2019-06-05 | Discharge: 2019-06-05 | Disposition: A | Discharge status: Home / Self Care | Payer: Medicare Other | Source: Ambulatory Visit | Attending: Neurosurgery | Admitting: Neurosurgery

## 2019-06-05 ENCOUNTER — Other Ambulatory Visit: Payer: Self-pay

## 2019-06-05 DIAGNOSIS — K219 Gastro-esophageal reflux disease without esophagitis: Secondary | ICD-10-CM | POA: Diagnosis not present

## 2019-06-05 DIAGNOSIS — Z79899 Other long term (current) drug therapy: Secondary | ICD-10-CM | POA: Diagnosis not present

## 2019-06-05 DIAGNOSIS — Z85828 Personal history of other malignant neoplasm of skin: Secondary | ICD-10-CM | POA: Diagnosis not present

## 2019-06-05 DIAGNOSIS — J45909 Unspecified asthma, uncomplicated: Secondary | ICD-10-CM | POA: Insufficient documentation

## 2019-06-05 DIAGNOSIS — G4733 Obstructive sleep apnea (adult) (pediatric): Secondary | ICD-10-CM | POA: Insufficient documentation

## 2019-06-05 DIAGNOSIS — E119 Type 2 diabetes mellitus without complications: Secondary | ICD-10-CM | POA: Insufficient documentation

## 2019-06-05 DIAGNOSIS — I10 Essential (primary) hypertension: Secondary | ICD-10-CM | POA: Diagnosis not present

## 2019-06-05 DIAGNOSIS — Z9884 Bariatric surgery status: Secondary | ICD-10-CM | POA: Diagnosis not present

## 2019-06-05 DIAGNOSIS — Z20822 Contact with and (suspected) exposure to covid-19: Secondary | ICD-10-CM | POA: Insufficient documentation

## 2019-06-05 DIAGNOSIS — M4802 Spinal stenosis, cervical region: Secondary | ICD-10-CM | POA: Diagnosis not present

## 2019-06-05 DIAGNOSIS — Z7984 Long term (current) use of oral hypoglycemic drugs: Secondary | ICD-10-CM | POA: Diagnosis not present

## 2019-06-05 DIAGNOSIS — Z01818 Encounter for other preprocedural examination: Secondary | ICD-10-CM | POA: Diagnosis present

## 2019-06-05 DIAGNOSIS — Z981 Arthrodesis status: Secondary | ICD-10-CM | POA: Diagnosis not present

## 2019-06-05 LAB — CBC
HCT: 40.7 % (ref 39.0–52.0)
Hemoglobin: 13.1 g/dL (ref 13.0–17.0)
MCH: 28.4 pg (ref 26.0–34.0)
MCHC: 32.2 g/dL (ref 30.0–36.0)
MCV: 88.1 fL (ref 80.0–100.0)
Platelets: 152 10*3/uL (ref 150–400)
RBC: 4.62 MIL/uL (ref 4.22–5.81)
RDW: 12.8 % (ref 11.5–15.5)
WBC: 5.1 10*3/uL (ref 4.0–10.5)
nRBC: 0 % (ref 0.0–0.2)

## 2019-06-05 LAB — SURGICAL PCR SCREEN
MRSA, PCR: NEGATIVE
Staphylococcus aureus: NEGATIVE

## 2019-06-05 LAB — TYPE AND SCREEN
ABO/RH(D): O POS
Antibody Screen: NEGATIVE

## 2019-06-05 LAB — BASIC METABOLIC PANEL
Anion gap: 9 (ref 5–15)
BUN: 23 mg/dL (ref 8–23)
CO2: 29 mmol/L (ref 22–32)
Calcium: 9.1 mg/dL (ref 8.9–10.3)
Chloride: 101 mmol/L (ref 98–111)
Creatinine, Ser: 1.8 mg/dL — ABNORMAL HIGH (ref 0.61–1.24)
GFR calc Af Amer: 44 mL/min — ABNORMAL LOW (ref >60)
GFR calc non Af Amer: 38 mL/min — ABNORMAL LOW (ref >60)
Glucose, Bld: 121 mg/dL — ABNORMAL HIGH (ref 70–99)
Potassium: 4.2 mmol/L (ref 3.5–5.1)
Sodium: 139 mmol/L (ref 135–145)

## 2019-06-05 LAB — GLUCOSE, CAPILLARY: Glucose-Capillary: 109 mg/dL — ABNORMAL HIGH (ref 70–99)

## 2019-06-05 LAB — SARS CORONAVIRUS 2 (TAT 6-24 HRS): SARS Coronavirus 2: NEGATIVE

## 2019-06-05 NOTE — Progress Notes (Signed)
PCP - Dr. Clayton Bibles Vasireddy Cardiologist - Dr. Orpah Greek in [place], [place] Orders -N/A  Rep Notified - N/A  Chest x-ray - N/A EKG - 10/07/18 Stress Test - 05/15/16-in media tab under correspondence 06/07/16. Low risk study. ECHO - 2016- Cardiac Cath - +25 years ago.    Sleep Study - previously OSA+, pt reports a 100 lb weight loss and no longer having issues with sleep apnea. CPAP - denies  Fasting Blood Sugar - 110-140. Checks Blood Sugar __1___ times a week.  Blood Thinner Instructions:N/A Aspirin Instructions:N/A  ERAS Protcol -N/A PRE-SURGERY Ensure or G2- N/A  COVID TEST- Pt to go today after PAT appointment.   Anesthesia review: Yes, history.  Patient denies shortness of breath, fever, cough and chest pain at PAT appointment   All instructions explained to the patient, with a verbal understanding of the material. Patient agrees to go over the instructions while at home for a better understanding. Patient also instructed to self quarantine after being tested for COVID-19. The opportunity to ask questions was provided.   Coronavirus Screening  Have you experienced the following symptoms:  Cough yes/no: No Fever (>100.51F)  yes/no: No Runny nose yes/no: No Sore throat yes/no: No Difficulty breathing/shortness of breath  yes/no: No  Have you or a family member traveled in the last 14 days and where? yes/no: No   If the patient indicates "YES" to the above questions, their PAT will be rescheduled to limit the exposure to others and, the surgeon will be notified. THE PATIENT WILL NEED TO BE ASYMPTOMATIC FOR 14 DAYS.   If the patient is not experiencing any of these symptoms, the PAT nurse will instruct them to NOT bring anyone with them to their appointment since they may have these symptoms or traveled as well.   Please remind your patients and families that hospital visitation restrictions are in effect and the importance of the restrictions.

## 2019-06-05 NOTE — Anesthesia Preprocedure Evaluation (Addendum)
Anesthesia Evaluation  Patient identified by MRN, date of birth, ID band Patient awake    Reviewed: Allergy & Precautions, NPO status , Patient's Chart, lab work & pertinent test results  Airway Mallampati: III  TM Distance: >3 FB Neck ROM: Limited    Dental no notable dental hx.    Pulmonary asthma (controlled) , sleep apnea ,    Pulmonary exam normal breath sounds clear to auscultation       Cardiovascular hypertension, Pt. on medications Normal cardiovascular exam Rhythm:Regular Rate:Normal  ECG: rate 63. Normal sinus rhythm Right bundle branch block Inferior infarct , age undetermined  Normal stress echo 12/31/14     Neuro/Psych Anxiety negative neurological ROS     GI/Hepatic Neg liver ROS, GERD  Medicated and Controlled,  Endo/Other  diabetes, Oral Hypoglycemic Agents  Renal/GU Renal InsufficiencyRenal disease     Musculoskeletal negative musculoskeletal ROS (+)   Abdominal   Peds  Hematology negative hematology ROS (+)   Anesthesia Other Findings Spinal stenosis, Cervical region  Reproductive/Obstetrics                           Anesthesia Physical Anesthesia Plan  ASA: III  Anesthesia Plan: General   Post-op Pain Management:    Induction: Intravenous  PONV Risk Score and Plan: 2 and Ondansetron, Dexamethasone, Midazolam and Treatment may vary due to age or medical condition  Airway Management Planned: Oral ETT and Video Laryngoscope Planned  Additional Equipment:   Intra-op Plan:   Post-operative Plan: Extubation in OR  Informed Consent: I have reviewed the patients History and Physical, chart, labs and discussed the procedure including the risks, benefits and alternatives for the proposed anesthesia with the patient or authorized representative who has indicated his/her understanding and acceptance.     Dental advisory given  Plan Discussed with:  CRNA  Anesthesia Plan Comments: (Reviewed PAT note written 06/05/2019 by Myra Gianotti, PA-C.  MRI C-spine 04/28/19: 1. Status post interbody fusion and anterior fixation from C3 through C7 with C5 corpectomy. 2. Posterior angulation of the C4 vertebral body with residual posterior disc osteophyte complex causing flattening of the spinal cord and moderate spinal canal stenosis at C4-5. No cord signal abnormality. 3. Residual posterior osteophyte at C6-7 causing flattening of the spinal cord with moderate spinal canal stenosis. No cord signal abnormality. 4. Multilevel neural foraminal narrowing as described above, worst at C3-4 where there is severe bilateral neural foraminal narrowing. 5. Asymmetry of the palatine tonsils with effacement of the left piriform sinus, only partially evaluated, correlation with direct exam is recommended.       )      Anesthesia Quick Evaluation

## 2019-06-05 NOTE — Progress Notes (Addendum)
Anesthesia Chart Review:  Case: [id number] Date/Time: 06/08/19 0715   Procedure: Cervical 3 to Cervical 7 Posterior cervical decompression and fusion (N/A ) - 3C   Anesthesia type: General   Pre-op diagnosis: Spinal stenosis, Cervical region   Location: [place] OR ROOM 21 / [place] OR   Surgeons: Erline Levine, MD      DISCUSSION: DISCUSSION: Allen [name] is a 69 year old [name] scheduled for the above procedure.   History includes never smoker, HTN, HLD, CKD, GERD, DM2, anemia, asthma, skin cancer, claustrophobia, OSA (no CPAP since weight loss), Gastric Roux-en-Y (06/04/16), nasal sinus surgery, neck surgeries (last right sided revision of hardware removal at C3-4, C4-5 ACDF with corpectomy at C5, redo C3-4 ACDF, anterior cervical plate C3-6 [id number]).   Preoperative labs show a Cr 1.80, previously 1.70 on 04/28/19. Known CKD. Allen [name] has had a variable Creatinine in review of labs dating back to 12/28/14 (some of which are scanned under Media tab and others in [facility]). Cr 1.55 12/28/14 and ranged from 1.51-1.87 from 03/2015-05/2016 and 1.15-1.20 in 2019, but ~ 1.4 in 2020 and 1.7-1.8 since 03/2019.   10/07/18 EKG appears stable since at least 12/2014 and had a normal stress echo that same month. Allen [name] denied chest pain, SOB, cough, fever at PAT RN visit.    06/05/19 presurgical COVID-19 test in process. Anesthesia team to evaluate on the day of surgery.     VS: BP (!) 140/94   Pulse 62   Temp 36.6 C (Oral)   Resp 18   Wt 83.2 kg   SpO2 98%   BMI 25.59 kg/m     PROVIDERS: Vasireddy, Lanetta Inch, MD is PCP [name]) Loni Beckwith, MD is endocrinologist Cecilie Lowers, MD is cardiologist ([facility]). Allen [name] saw Allen [name] for HTN and chest pain in 2016-2017 and had a normal stress echo and again in 2018 for preoperative evaluation before bariatric surgery and had a normal ETT to 69% of predicted HR. (Records scanned under Media tab.)   LABS: Preoperative labs noted. See  DISCUSSION. A1c 7.1% 04/20/19.  (all labs ordered are listed, but only abnormal results are displayed)  Labs Reviewed  GLUCOSE, CAPILLARY - Abnormal; Notable for the following components:      Result Value   Glucose-Capillary 109 (*)    All other components within normal limits  BASIC METABOLIC PANEL - Abnormal; Notable for the following components:   Glucose, Bld 121 (*)    Creatinine, Ser 1.80 (*)    GFR calc non Af Amer 38 (*)    GFR calc Af Amer 44 (*)    All other components within normal limits  SURGICAL PCR SCREEN  CBC  TYPE AND SCREEN     IMAGES: MRI C-spine 04/28/19 (ordered by Erline Levine, MD): IMPRESSION: 1. Status post interbody fusion and anterior fixation from C3 through C7 with C5 corpectomy. 2. Posterior angulation of the C4 vertebral body with residual posterior disc osteophyte complex causing flattening of the spinal cord and moderate spinal canal stenosis at C4-5. No cord signal abnormality. 3. Residual posterior osteophyte at C6-7 causing flattening of the spinal cord with moderate spinal canal stenosis. No cord signal abnormality. 4. Multilevel neural foraminal narrowing as described above, worst at C3-4 where there is severe bilateral neural foraminal narrowing. 5. Asymmetry of the palatine tonsils with effacement of the left piriform sinus, only partially evaluated, correlation with direct exam is recommended.   EKG: 10/07/18: Normal sinus rhythm Right bundle branch block Inferior infarct , age undetermined  Abnormal ECG No significant change since last tracing [04/25/2016 and 12/24/2014] Confirmed by Ena Dawley [phone]) on 10/07/2018 12:47:46 PM   CV: ETT 05/15/16 ([facility]; Scanned under Media tab, Correspondence 06/04/16, done as part of cardiac clearance for bariatric surgery) Stress test carried out and Allen [name] walked 7 minutes, which is good exercise tolerance, to a heart rate of 106 with blood pressure of 170/98.  Allen [name]  achieved 69% of predicted heart rate.  Allen [name] had no dysrhythmias, no chest pain and no electrocardiographic changes.  Allen [name] was on metoprolol. Impression:  Normal stress test to 69% of predicted heart rate  Stress echo 12/31/14 ([facility]; Scanned under Media tab, Encounter date 05/24/15) Normal stress echo.   Allen [name] thinks Allen [name] may have had a cardiac cath over 25 years ago. No reported CAD history   Past Medical History:  Diagnosis Date  . Anemia    on iron since gastric bypass  . Arthritis    mild  . Asthma   . Chronic kidney disease    stage 3, now resolved since weight loss  . Claustrophobia   . Diabetes mellitus, type II ([place])    type 2 metformin  . GERD (gastroesophageal reflux disease)    takes nexium  . Hyperlipidemia   . Hypertension   . Pneumonia 15 yrs ago  . SCC (squamous cell carcinoma) 12/29/2007   Left Mid Preauricular(Mod.Diff) (Dr. Lacinda Axon)  . SCC (squamous cell carcinoma) 12/29/2007   Right Upper Dorsal Nose(Well Diff) (Dr. Lacinda Axon)  . SCC (squamous cell carcinoma) 12/29/2007   Anti-Tragus Right Ear(Mod.Diff) (Dr. Lacinda Axon)  . SCC (squamous cell carcinoma) 06/06/2010   Left Nose(in Situ) (curet and 5FU)  . SCC (squamous cell carcinoma) 06/06/2010   Right Check(Well Diff) (curet and 5FU)  . SCC (squamous cell carcinoma) 07/20/2013   Right Sideburn(KA) (curet and 5FU)  . SCC (squamous cell carcinoma) 06/27/2015   Right Neck Sup(in Situ) (curet and 5FU)  . SCC (squamous cell carcinoma) 06/27/2015   Right Neck Inf(in Situ) (curet and 5FU)  . SCC (squamous cell carcinoma) 01/02/2017   Left Wrist(in Situ) (curet and 5FU)  . SCC (squamous cell carcinoma) 01/02/2017   Right Outer Cheek(in Situ) (curet and 5FU)  . SCC (squamous cell carcinoma) 06/23/2018   Left Sideburn(in Situ) (curet and 5FU)  . SCC (squamous cell carcinoma) 06/23/2018   Left Upper Lip(Well Diff) (MOH's)  . SCC (squamous cell carcinoma) 06/23/2018   Right Ear Rim(in Situ) (curet and  5FU)  . Sleep apnea    cpap, no cpap since may 2018 weight loss 80 labs   . Superficial nodular basal cell carcinoma (BCC) 06/27/2015   Left Rim of Ear (curet and 5FU)    Past Surgical History:  Procedure Laterality Date  . ANTERIOR CERVICAL DECOMP/DISCECTOMY FUSION Right 10/10/2018   Procedure: Right sided revision with hardware removal at Cervical three-four, Cervical four-five Anterior cervical decompression/discectomy/fusion with corpectomy at Cervical five;  Surgeon: Erline Levine, MD;  Location: [place];  Service: Neurosurgery;  Laterality: Right;  . CARDIAC CATHETERIZATION     Allen [name] reports Allen [name] thinks Allen [name] had one over 25 years ago  . carpal tunnel Bilateral   . CERVICAL VERTEBRAE EXCISION     and cleaning  . CHOLECYSTECTOMY    . cyst removed left hand  01-28-17  . ESOPHAGOGASTRODUODENOSCOPY (EGD) WITH PROPOFOL N/A 01/31/2017   Procedure: ESOPHAGOGASTRODUODENOSCOPY (EGD) WITH PROPOFOL;  Surgeon: Alphonsa Overall, MD;  Location: [facility];  Service: General;  Laterality: N/A;  . EYE  SURGERY     bilateral cataract removal  . GASTRIC ROUX-EN-Y N/A 06/04/2016   Procedure: LAPAROSCOPIC ROUX-EN-Y GASTRIC BYPASS WITH UPPER ENDOSCOPY;  Surgeon: Clovis Riley, MD;  Location: [facility];  Service: General;  Laterality: N/A;  . NASAL SINUS SURGERY    . RADIOLOGY WITH ANESTHESIA N/A 04/28/2019   Procedure: MRI RADIOLOGY WITH ANESTHESIA;  Surgeon: Radiologist, Medication, MD;  Location: [place];  Service: Radiology;  Laterality: N/A;  . TRIGGER FINGER RELEASE      MEDICATIONS: . acetaminophen (TYLENOL) 500 MG tablet  . amLODipine (NORVASC) 10 MG tablet  . Calcium Carb-Cholecalciferol (CALCIUM 500+D3 PO)  . calcium carbonate (TUMS - DOSED IN MG ELEMENTAL CALCIUM) 500 MG chewable tablet  . Cyanocobalamin (VITAMIN B-12) 3000 MCG SUBL  . escitalopram (LEXAPRO) 20 MG tablet  . gabapentin (NEURONTIN) 300 MG capsule  . HYDROcodone-acetaminophen (NORCO/VICODIN) 5-325 MG tablet  . levocetirizine (XYZAL)  5 MG tablet  . Magnesium 400 MG CAPS  . metFORMIN (GLUCOPHAGE) 500 MG tablet  . metoCLOPramide (REGLAN) 5 MG tablet  . Multiple Vitamin (MULTIVITAMIN WITH MINERALS) TABS tablet  . olmesartan-hydrochlorothiazide (BENICAR HCT) 40-12.5 MG tablet  . Omalizumab (XOLAIR [place])  . omeprazole (PRILOSEC) 20 MG capsule  . ondansetron (ZOFRAN) 4 MG tablet  . Semaglutide (OZEMPIC, 0.25 OR 0.5 MG/DOSE, )   No current facility-administered medications for this encounter.    Myra Gianotti, PA-C Surgical Short Stay/Anesthesiology [facility] Phone [phone] [facility] Phone [phone] 06/05/2019 11:53 AM

## 2019-06-08 ENCOUNTER — Encounter (HOSPITAL_COMMUNITY): Payer: Self-pay | Admitting: Neurosurgery

## 2019-06-08 ENCOUNTER — Observation Stay (HOSPITAL_COMMUNITY)
Admission: RE | Admit: 2019-06-08 | Discharge: 2019-06-10 | Disposition: A | Discharge status: Home / Self Care | Payer: Medicare Other | Attending: Neurosurgery | Admitting: Neurosurgery

## 2019-06-08 ENCOUNTER — Inpatient Hospital Stay (HOSPITAL_COMMUNITY): Payer: Medicare Other

## 2019-06-08 ENCOUNTER — Encounter (HOSPITAL_COMMUNITY)
Admission: RE | Disposition: A | Discharge status: Home / Self Care | Payer: Self-pay | Source: Home / Self Care | Attending: Neurosurgery

## 2019-06-08 ENCOUNTER — Other Ambulatory Visit: Payer: Self-pay

## 2019-06-08 ENCOUNTER — Inpatient Hospital Stay (HOSPITAL_COMMUNITY): Payer: Medicare Other | Admitting: Vascular Surgery

## 2019-06-08 DIAGNOSIS — I10 Essential (primary) hypertension: Secondary | ICD-10-CM | POA: Insufficient documentation

## 2019-06-08 DIAGNOSIS — Z7984 Long term (current) use of oral hypoglycemic drugs: Secondary | ICD-10-CM | POA: Insufficient documentation

## 2019-06-08 DIAGNOSIS — Z79899 Other long term (current) drug therapy: Secondary | ICD-10-CM | POA: Diagnosis not present

## 2019-06-08 DIAGNOSIS — G4730 Sleep apnea, unspecified: Secondary | ICD-10-CM | POA: Diagnosis not present

## 2019-06-08 DIAGNOSIS — S129XXA Fracture of neck, unspecified, initial encounter: Secondary | ICD-10-CM | POA: Diagnosis present

## 2019-06-08 DIAGNOSIS — M4322 Fusion of spine, cervical region: Secondary | ICD-10-CM | POA: Insufficient documentation

## 2019-06-08 DIAGNOSIS — M5412 Radiculopathy, cervical region: Secondary | ICD-10-CM | POA: Insufficient documentation

## 2019-06-08 DIAGNOSIS — M8448XA Pathological fracture, other site, initial encounter for fracture: Secondary | ICD-10-CM | POA: Insufficient documentation

## 2019-06-08 DIAGNOSIS — J45909 Unspecified asthma, uncomplicated: Secondary | ICD-10-CM | POA: Diagnosis not present

## 2019-06-08 DIAGNOSIS — E1121 Type 2 diabetes mellitus with diabetic nephropathy: Secondary | ICD-10-CM | POA: Diagnosis not present

## 2019-06-08 DIAGNOSIS — Z7951 Long term (current) use of inhaled steroids: Secondary | ICD-10-CM | POA: Diagnosis not present

## 2019-06-08 DIAGNOSIS — Z419 Encounter for procedure for purposes other than remedying health state, unspecified: Secondary | ICD-10-CM

## 2019-06-08 DIAGNOSIS — M40202 Unspecified kyphosis, cervical region: Secondary | ICD-10-CM | POA: Diagnosis not present

## 2019-06-08 DIAGNOSIS — M542 Cervicalgia: Secondary | ICD-10-CM | POA: Diagnosis not present

## 2019-06-08 DIAGNOSIS — K219 Gastro-esophageal reflux disease without esophagitis: Secondary | ICD-10-CM | POA: Diagnosis not present

## 2019-06-08 DIAGNOSIS — M4802 Spinal stenosis, cervical region: Secondary | ICD-10-CM | POA: Diagnosis not present

## 2019-06-08 DIAGNOSIS — G992 Myelopathy in diseases classified elsewhere: Secondary | ICD-10-CM | POA: Diagnosis not present

## 2019-06-08 HISTORY — PX: POSTERIOR CERVICAL FUSION/FORAMINOTOMY: SHX5038

## 2019-06-08 LAB — GLUCOSE, CAPILLARY
Glucose-Capillary: 160 mg/dL — ABNORMAL HIGH (ref 70–99)
Glucose-Capillary: 186 mg/dL — ABNORMAL HIGH (ref 70–99)
Glucose-Capillary: 266 mg/dL — ABNORMAL HIGH (ref 70–99)
Glucose-Capillary: 241 mg/dL — ABNORMAL HIGH (ref 70–99)
Glucose-Capillary: 252 mg/dL — ABNORMAL HIGH (ref 70–99)

## 2019-06-08 SURGERY — POSTERIOR CERVICAL FUSION/FORAMINOTOMY LEVEL 4
Anesthesia: General

## 2019-06-08 MED ORDER — ONDANSETRON HCL 4 MG/2ML IJ SOLN
INTRAMUSCULAR | Status: DC | PRN
  Administered 2019-06-08: 4 mg via INTRAVENOUS

## 2019-06-08 MED ORDER — PANTOPRAZOLE SODIUM 40 MG IV SOLR: 40.0000 mg | Freq: Every day | INTRAVENOUS | Status: DC

## 2019-06-08 MED ORDER — DOUBLE ANTIBIOTIC 500-10000 UNIT/GM EX OINT
TOPICAL_OINTMENT | CUTANEOUS | Status: AC
  Filled 2019-06-08: qty 28.4

## 2019-06-08 MED ORDER — ACETAMINOPHEN 500 MG PO TABS
1000.0000 mg | ORAL_TABLET | Freq: Four times a day (QID) | ORAL | Status: DC | PRN
  Administered 2019-06-09: 1000 mg via ORAL
  Filled 2019-06-08: qty 2

## 2019-06-08 MED ORDER — BACITRACIN ZINC 500 UNIT/GM EX OINT
TOPICAL_OINTMENT | CUTANEOUS | Status: DC | PRN
  Administered 2019-06-08: 1 via TOPICAL

## 2019-06-08 MED ORDER — INSULIN ASPART 100 UNIT/ML ~~LOC~~ SOLN
0.0000 [IU] | Freq: Three times a day (TID) | SUBCUTANEOUS | Status: DC
  Administered 2019-06-08: 8 [IU] via SUBCUTANEOUS
  Administered 2019-06-08: 5 [IU] via SUBCUTANEOUS
  Administered 2019-06-09: 2 [IU] via SUBCUTANEOUS
  Administered 2019-06-09: 3 [IU] via SUBCUTANEOUS

## 2019-06-08 MED ORDER — FLEET ENEMA 7-19 GM/118ML RE ENEM: 1.0000 | ENEMA | Freq: Once | RECTAL | Status: DC | PRN

## 2019-06-08 MED ORDER — PROPOFOL 10 MG/ML IV BOLUS
INTRAVENOUS | Status: DC | PRN
  Administered 2019-06-08: 150 mg via INTRAVENOUS
  Administered 2019-06-08: 50 mg via INTRAVENOUS
  Administered 2019-06-08: 150 mg via INTRAVENOUS

## 2019-06-08 MED ORDER — LORATADINE 10 MG PO TABS
10.0000 mg | ORAL_TABLET | Freq: Every day | ORAL | Status: DC
  Administered 2019-06-09 – 2019-06-10 (×2): 10 mg via ORAL
  Filled 2019-06-08 (×2): qty 1

## 2019-06-08 MED ORDER — CHLORHEXIDINE GLUCONATE CLOTH 2 % EX PADS: 6.0000 | MEDICATED_PAD | Freq: Once | CUTANEOUS | Status: DC

## 2019-06-08 MED ORDER — AMLODIPINE BESYLATE 5 MG PO TABS
10.0000 mg | ORAL_TABLET | Freq: Every day | ORAL | Status: AC
  Administered 2019-06-08: 10 mg via ORAL

## 2019-06-08 MED ORDER — ESCITALOPRAM OXALATE 20 MG PO TABS
20.0000 mg | ORAL_TABLET | Freq: Every day | ORAL | Status: DC
  Administered 2019-06-09 – 2019-06-10 (×2): 20 mg via ORAL
  Filled 2019-06-08 (×2): qty 1

## 2019-06-08 MED ORDER — METHOCARBAMOL 1000 MG/10ML IJ SOLN
500.0000 mg | Freq: Four times a day (QID) | INTRAVENOUS | Status: DC | PRN
  Administered 2019-06-09 (×2): 500 mg via INTRAVENOUS
  Filled 2019-06-08 (×3): qty 5

## 2019-06-08 MED ORDER — METHOCARBAMOL 500 MG PO TABS
ORAL_TABLET | ORAL | Status: AC
  Filled 2019-06-08: qty 1

## 2019-06-08 MED ORDER — PHENYLEPHRINE 40 MCG/ML (10ML) SYRINGE FOR IV PUSH (FOR BLOOD PRESSURE SUPPORT)
PREFILLED_SYRINGE | INTRAVENOUS | Status: AC
  Filled 2019-06-08: qty 10

## 2019-06-08 MED ORDER — VITAMIN B-12 3000 MCG SL SUBL: 3000.0000 ug | SUBLINGUAL_TABLET | Freq: Every day | SUBLINGUAL | Status: DC

## 2019-06-08 MED ORDER — KCL IN DEXTROSE-NACL 20-5-0.45 MEQ/L-%-% IV SOLN
INTRAVENOUS | Status: DC
  Filled 2019-06-08: qty 1000

## 2019-06-08 MED ORDER — MIDAZOLAM HCL 2 MG/2ML IJ SOLN
INTRAMUSCULAR | Status: AC
  Filled 2019-06-08: qty 2

## 2019-06-08 MED ORDER — ROCURONIUM BROMIDE 10 MG/ML (PF) SYRINGE
PREFILLED_SYRINGE | INTRAVENOUS | Status: DC | PRN
  Administered 2019-06-08: 40 mg via INTRAVENOUS
  Administered 2019-06-08: 60 mg via INTRAVENOUS
  Administered 2019-06-08: 20 mg via INTRAVENOUS

## 2019-06-08 MED ORDER — SUGAMMADEX SODIUM 200 MG/2ML IV SOLN
INTRAVENOUS | Status: DC | PRN
  Administered 2019-06-08: 170 mg via INTRAVENOUS

## 2019-06-08 MED ORDER — METOCLOPRAMIDE HCL 5 MG PO TABS
5.0000 mg | ORAL_TABLET | Freq: Three times a day (TID) | ORAL | Status: DC
  Administered 2019-06-08 – 2019-06-10 (×6): 5 mg via ORAL
  Filled 2019-06-08 (×6): qty 1

## 2019-06-08 MED ORDER — ADULT MULTIVITAMIN W/MINERALS CH
1.0000 | ORAL_TABLET | Freq: Every day | ORAL | Status: DC
  Administered 2019-06-09 – 2019-06-10 (×2): 1 via ORAL
  Filled 2019-06-08 (×2): qty 1

## 2019-06-08 MED ORDER — PHENYLEPHRINE HCL-NACL 10-0.9 MG/250ML-% IV SOLN
INTRAVENOUS | Status: DC | PRN
  Administered 2019-06-08: 25 ug/min via INTRAVENOUS

## 2019-06-08 MED ORDER — HYDROCODONE-ACETAMINOPHEN 5-325 MG PO TABS
1.0000 | ORAL_TABLET | ORAL | Status: DC | PRN
  Administered 2019-06-08 – 2019-06-09 (×3): 2 via ORAL
  Filled 2019-06-08 (×3): qty 2

## 2019-06-08 MED ORDER — PROMETHAZINE HCL 25 MG/ML IJ SOLN
6.2500 mg | Freq: Once | INTRAMUSCULAR | Status: AC
  Administered 2019-06-08: 6.25 mg via INTRAVENOUS

## 2019-06-08 MED ORDER — CALCIUM CARBONATE ANTACID 500 MG PO CHEW: 1.0000 | CHEWABLE_TABLET | Freq: Three times a day (TID) | ORAL | Status: DC | PRN

## 2019-06-08 MED ORDER — 0.9 % SODIUM CHLORIDE (POUR BTL) OPTIME
TOPICAL | Status: DC | PRN
  Administered 2019-06-08: 1000 mL

## 2019-06-08 MED ORDER — CALCIUM CARBONATE-VITAMIN D 500-200 MG-UNIT PO TABS
1.0000 | ORAL_TABLET | Freq: Three times a day (TID) | ORAL | Status: DC
  Administered 2019-06-08 – 2019-06-10 (×6): 1 via ORAL
  Filled 2019-06-08 (×6): qty 1

## 2019-06-08 MED ORDER — MIDAZOLAM HCL 5 MG/5ML IJ SOLN
INTRAMUSCULAR | Status: DC | PRN
  Administered 2019-06-08: 2 mg via INTRAVENOUS

## 2019-06-08 MED ORDER — FENTANYL CITRATE (PF) 100 MCG/2ML IJ SOLN
INTRAMUSCULAR | Status: AC
  Filled 2019-06-08: qty 2

## 2019-06-08 MED ORDER — FENTANYL CITRATE (PF) 250 MCG/5ML IJ SOLN
INTRAMUSCULAR | Status: AC
  Filled 2019-06-08: qty 5

## 2019-06-08 MED ORDER — THROMBIN 20000 UNITS EX SOLR
CUTANEOUS | Status: DC | PRN
  Administered 2019-06-08: 20 mL via TOPICAL

## 2019-06-08 MED ORDER — PHENYLEPHRINE 40 MCG/ML (10ML) SYRINGE FOR IV PUSH (FOR BLOOD PRESSURE SUPPORT)
PREFILLED_SYRINGE | INTRAVENOUS | Status: DC | PRN
  Administered 2019-06-08: 80 ug via INTRAVENOUS

## 2019-06-08 MED ORDER — ACETAMINOPHEN 500 MG PO TABS: 1000.0000 mg | ORAL_TABLET | Freq: Three times a day (TID) | ORAL | Status: DC | PRN

## 2019-06-08 MED ORDER — MENTHOL 3 MG MT LOZG: 1.0000 | LOZENGE | OROMUCOSAL | Status: DC | PRN

## 2019-06-08 MED ORDER — METHOCARBAMOL 500 MG PO TABS
500.0000 mg | ORAL_TABLET | Freq: Four times a day (QID) | ORAL | Status: DC | PRN
Start: 2019-06-08 — End: 2019-06-08
  Administered 2019-06-08: 500 mg via ORAL

## 2019-06-08 MED ORDER — LIDOCAINE 2% (20 MG/ML) 5 ML SYRINGE
INTRAMUSCULAR | Status: DC | PRN
  Administered 2019-06-08: 60 mg via INTRAVENOUS

## 2019-06-08 MED ORDER — METHOCARBAMOL 750 MG PO TABS
750.0000 mg | ORAL_TABLET | Freq: Four times a day (QID) | ORAL | Status: DC | PRN
  Administered 2019-06-08 – 2019-06-10 (×4): 750 mg via ORAL
  Filled 2019-06-08 (×4): qty 1

## 2019-06-08 MED ORDER — ALUM & MAG HYDROXIDE-SIMETH 200-200-20 MG/5ML PO SUSP: 30.0000 mL | Freq: Four times a day (QID) | ORAL | Status: DC | PRN

## 2019-06-08 MED ORDER — LEVOCETIRIZINE DIHYDROCHLORIDE 5 MG PO TABS: 5.0000 mg | ORAL_TABLET | Freq: Every day | ORAL | Status: DC

## 2019-06-08 MED ORDER — GABAPENTIN 300 MG PO CAPS
300.0000 mg | ORAL_CAPSULE | Freq: Every day | ORAL | Status: DC
  Administered 2019-06-08 – 2019-06-09 (×2): 300 mg via ORAL
  Filled 2019-06-08 (×2): qty 1

## 2019-06-08 MED ORDER — IRBESARTAN 300 MG PO TABS
300.0000 mg | ORAL_TABLET | Freq: Every day | ORAL | Status: DC
  Administered 2019-06-08 – 2019-06-09 (×2): 300 mg via ORAL
  Filled 2019-06-08 (×3): qty 1

## 2019-06-08 MED ORDER — THROMBIN 5000 UNITS EX SOLR
CUTANEOUS | Status: AC
  Filled 2019-06-08: qty 5000

## 2019-06-08 MED ORDER — HYDROXYZINE HCL 50 MG/ML IM SOLN
50.0000 mg | Freq: Four times a day (QID) | INTRAMUSCULAR | Status: DC | PRN
  Administered 2019-06-08: 50 mg via INTRAMUSCULAR
  Filled 2019-06-08: qty 1

## 2019-06-08 MED ORDER — FENTANYL CITRATE (PF) 100 MCG/2ML IJ SOLN
25.0000 ug | INTRAMUSCULAR | Status: DC | PRN
  Administered 2019-06-08 (×2): 25 ug via INTRAVENOUS

## 2019-06-08 MED ORDER — VITAMIN B-12 1000 MCG PO TABS
3000.0000 ug | ORAL_TABLET | Freq: Every day | ORAL | Status: DC
  Administered 2019-06-09 – 2019-06-10 (×2): 3000 ug via ORAL
  Filled 2019-06-08 (×2): qty 3

## 2019-06-08 MED ORDER — ACETAMINOPHEN 500 MG PO TABS: 1000.0000 mg | ORAL_TABLET | Freq: Once | ORAL | Status: AC

## 2019-06-08 MED ORDER — LIDOCAINE-EPINEPHRINE 1 %-1:100000 IJ SOLN
INTRAMUSCULAR | Status: DC | PRN
  Administered 2019-06-08: 5 mL

## 2019-06-08 MED ORDER — DOCUSATE SODIUM 100 MG PO CAPS
100.0000 mg | ORAL_CAPSULE | Freq: Two times a day (BID) | ORAL | Status: DC
  Administered 2019-06-08 – 2019-06-10 (×4): 100 mg via ORAL
  Filled 2019-06-08 (×4): qty 1

## 2019-06-08 MED ORDER — LIDOCAINE 2% (20 MG/ML) 5 ML SYRINGE
INTRAMUSCULAR | Status: AC
  Filled 2019-06-08: qty 5

## 2019-06-08 MED ORDER — CEFAZOLIN SODIUM-DEXTROSE 2-4 GM/100ML-% IV SOLN
2.0000 g | Freq: Three times a day (TID) | INTRAVENOUS | Status: AC
  Administered 2019-06-08 (×2): 2 g via INTRAVENOUS
  Filled 2019-06-08 (×2): qty 100

## 2019-06-08 MED ORDER — THROMBIN 5000 UNITS EX SOLR
OROMUCOSAL | Status: DC | PRN
  Administered 2019-06-08: 5 mL via TOPICAL

## 2019-06-08 MED ORDER — ROCURONIUM BROMIDE 10 MG/ML (PF) SYRINGE
PREFILLED_SYRINGE | INTRAVENOUS | Status: AC
  Filled 2019-06-08: qty 10

## 2019-06-08 MED ORDER — ONDANSETRON HCL 4 MG/2ML IJ SOLN
INTRAMUSCULAR | Status: AC
  Filled 2019-06-08: qty 2

## 2019-06-08 MED ORDER — CEFAZOLIN SODIUM-DEXTROSE 2-4 GM/100ML-% IV SOLN
INTRAVENOUS | Status: AC
  Filled 2019-06-08: qty 100

## 2019-06-08 MED ORDER — LACTATED RINGERS IV SOLN: INTRAVENOUS | Status: DC | PRN

## 2019-06-08 MED ORDER — POLYETHYLENE GLYCOL 3350 17 G PO PACK: 17.0000 g | PACK | Freq: Every day | ORAL | Status: DC | PRN

## 2019-06-08 MED ORDER — KETAMINE HCL 50 MG/5ML IJ SOSY
PREFILLED_SYRINGE | INTRAMUSCULAR | Status: AC
  Filled 2019-06-08: qty 5

## 2019-06-08 MED ORDER — OXYCODONE HCL 5 MG PO TABS
10.0000 mg | ORAL_TABLET | ORAL | Status: DC | PRN
  Administered 2019-06-08: 10 mg via ORAL
  Filled 2019-06-08: qty 2

## 2019-06-08 MED ORDER — METFORMIN HCL 500 MG PO TABS
500.0000 mg | ORAL_TABLET | Freq: Every day | ORAL | Status: DC
  Administered 2019-06-09 – 2019-06-10 (×2): 500 mg via ORAL
  Filled 2019-06-08 (×2): qty 1

## 2019-06-08 MED ORDER — FENTANYL CITRATE (PF) 250 MCG/5ML IJ SOLN
INTRAMUSCULAR | Status: DC | PRN
  Administered 2019-06-08: 50 ug via INTRAVENOUS
  Administered 2019-06-08: 100 ug via INTRAVENOUS
  Administered 2019-06-08: 50 ug via INTRAVENOUS

## 2019-06-08 MED ORDER — PROPOFOL 10 MG/ML IV BOLUS
INTRAVENOUS | Status: AC
  Filled 2019-06-08: qty 40

## 2019-06-08 MED ORDER — PHENOL 1.4 % MT LIQD: 1.0000 | OROMUCOSAL | Status: DC | PRN

## 2019-06-08 MED ORDER — DEXAMETHASONE SODIUM PHOSPHATE 10 MG/ML IJ SOLN
INTRAMUSCULAR | Status: DC | PRN
  Administered 2019-06-08: 10 mg via INTRAVENOUS

## 2019-06-08 MED ORDER — CEFAZOLIN SODIUM-DEXTROSE 2-4 GM/100ML-% IV SOLN
2.0000 g | INTRAVENOUS | Status: AC
  Administered 2019-06-08: 2 g via INTRAVENOUS

## 2019-06-08 MED ORDER — LIDOCAINE-EPINEPHRINE 1 %-1:100000 IJ SOLN
INTRAMUSCULAR | Status: AC
  Filled 2019-06-08: qty 1

## 2019-06-08 MED ORDER — METHOCARBAMOL 1000 MG/10ML IJ SOLN: 500.0000 mg | Freq: Four times a day (QID) | INTRAVENOUS | Status: DC | PRN

## 2019-06-08 MED ORDER — BISACODYL 10 MG RE SUPP: 10.0000 mg | Freq: Every day | RECTAL | Status: DC | PRN

## 2019-06-08 MED ORDER — SODIUM CHLORIDE 0.9% FLUSH: 3.0000 mL | INTRAVENOUS | Status: DC | PRN

## 2019-06-08 MED ORDER — HYDROCHLOROTHIAZIDE 12.5 MG PO CAPS
12.5000 mg | ORAL_CAPSULE | Freq: Every day | ORAL | Status: DC
  Administered 2019-06-08 – 2019-06-09 (×2): 12.5 mg via ORAL
  Filled 2019-06-08 (×2): qty 1

## 2019-06-08 MED ORDER — THROMBIN 20000 UNITS EX SOLR
CUTANEOUS | Status: AC
  Filled 2019-06-08: qty 20000

## 2019-06-08 MED ORDER — METHYLPREDNISOLONE ACETATE 80 MG/ML IJ SUSP
INTRAMUSCULAR | Status: AC
  Filled 2019-06-08: qty 1

## 2019-06-08 MED ORDER — ZOLPIDEM TARTRATE 5 MG PO TABS: 5.0000 mg | ORAL_TABLET | Freq: Every evening | ORAL | Status: DC | PRN

## 2019-06-08 MED ORDER — AMLODIPINE BESYLATE 5 MG PO TABS
10.0000 mg | ORAL_TABLET | Freq: Every evening | ORAL | Status: DC
  Administered 2019-06-09: 10 mg via ORAL
  Filled 2019-06-08 (×2): qty 2

## 2019-06-08 MED ORDER — ONDANSETRON HCL 4 MG PO TABS: 4.0000 mg | ORAL_TABLET | Freq: Four times a day (QID) | ORAL | Status: DC | PRN

## 2019-06-08 MED ORDER — HYDROMORPHONE HCL 1 MG/ML IJ SOLN
0.5000 mg | INTRAMUSCULAR | Status: DC | PRN
  Administered 2019-06-08 – 2019-06-09 (×4): 1 mg via INTRAVENOUS
  Filled 2019-06-08 (×4): qty 1

## 2019-06-08 MED ORDER — CALCIUM CARB-CHOLECALCIFEROL 500-400 MG-UNIT PO TABS: ORAL_TABLET | Freq: Three times a day (TID) | ORAL | Status: DC

## 2019-06-08 MED ORDER — INSULIN ASPART 100 UNIT/ML ~~LOC~~ SOLN
0.0000 [IU] | Freq: Every day | SUBCUTANEOUS | Status: DC
  Administered 2019-06-08 – 2019-06-09 (×2): 3 [IU] via SUBCUTANEOUS

## 2019-06-08 MED ORDER — BUPIVACAINE HCL (PF) 0.5 % IJ SOLN
INTRAMUSCULAR | Status: AC
  Filled 2019-06-08: qty 30

## 2019-06-08 MED ORDER — HYDROCODONE-ACETAMINOPHEN 5-325 MG PO TABS
1.0000 | ORAL_TABLET | ORAL | Status: DC | PRN
  Administered 2019-06-09: 2 via ORAL
  Filled 2019-06-08: qty 2

## 2019-06-08 MED ORDER — KETAMINE HCL 10 MG/ML IJ SOLN
INTRAMUSCULAR | Status: DC | PRN
  Administered 2019-06-08: 10 mg via INTRAVENOUS
  Administered 2019-06-08: 20 mg via INTRAVENOUS

## 2019-06-08 MED ORDER — SODIUM CHLORIDE 0.9% FLUSH
3.0000 mL | Freq: Two times a day (BID) | INTRAVENOUS | Status: DC
  Administered 2019-06-08 – 2019-06-09 (×2): 3 mL via INTRAVENOUS

## 2019-06-08 MED ORDER — ONDANSETRON HCL 4 MG/2ML IJ SOLN
4.0000 mg | Freq: Once | INTRAMUSCULAR | Status: AC | PRN
  Administered 2019-06-08: 4 mg via INTRAVENOUS

## 2019-06-08 MED ORDER — PROMETHAZINE HCL 25 MG/ML IJ SOLN
INTRAMUSCULAR | Status: AC
  Filled 2019-06-08: qty 1

## 2019-06-08 MED ORDER — BUPIVACAINE HCL (PF) 0.5 % IJ SOLN
INTRAMUSCULAR | Status: DC | PRN
  Administered 2019-06-08: 5 mL

## 2019-06-08 MED ORDER — MAGNESIUM OXIDE 400 (241.3 MG) MG PO TABS
400.0000 mg | ORAL_TABLET | Freq: Every day | ORAL | Status: DC
  Administered 2019-06-09 – 2019-06-10 (×2): 400 mg via ORAL
  Filled 2019-06-08 (×2): qty 1

## 2019-06-08 MED ORDER — ACETAMINOPHEN 500 MG PO TABS
ORAL_TABLET | ORAL | Status: AC
  Administered 2019-06-08: 1000 mg via ORAL
  Filled 2019-06-08: qty 2

## 2019-06-08 MED ORDER — DEXAMETHASONE SODIUM PHOSPHATE 10 MG/ML IJ SOLN
INTRAMUSCULAR | Status: AC
  Filled 2019-06-08: qty 1

## 2019-06-08 MED ORDER — PANTOPRAZOLE SODIUM 40 MG PO TBEC
40.0000 mg | DELAYED_RELEASE_TABLET | Freq: Every day | ORAL | Status: DC
  Administered 2019-06-08 – 2019-06-10 (×3): 40 mg via ORAL
  Filled 2019-06-08 (×3): qty 1

## 2019-06-08 MED ORDER — ONDANSETRON HCL 4 MG/2ML IJ SOLN: 4.0000 mg | Freq: Four times a day (QID) | INTRAMUSCULAR | Status: DC | PRN

## 2019-06-08 MED ORDER — OLMESARTAN MEDOXOMIL-HCTZ 40-12.5 MG PO TABS: 1.0000 | ORAL_TABLET | Freq: Every day | ORAL | Status: DC

## 2019-06-08 SURGICAL SUPPLY — 74 items
BAND RUBBER #18 3X1/16 STRL (MISCELLANEOUS) IMPLANT
BASKET BONE COLLECTION (BASKET) ×3 IMPLANT
BIT DRILL NEURO 2X3.1 SFT TUCH (MISCELLANEOUS) IMPLANT
BIT DRILL VUEPOINT II (BIT) ×1 IMPLANT
BLADE CLIPPER SURG (BLADE) IMPLANT
BLADE SURG 11 STRL SS (BLADE) IMPLANT
BLADE ULTRA TIP 2M (BLADE) IMPLANT
BUR PRECISION FLUTE 5.0 (BURR) ×3 IMPLANT
CANISTER SUCT 3000ML PPV (MISCELLANEOUS) ×6 IMPLANT
CARTRIDGE OIL MAESTRO DRILL (MISCELLANEOUS) ×1 IMPLANT
COVER WAND RF STERILE (DRAPES) IMPLANT
DECANTER SPIKE VIAL GLASS SM (MISCELLANEOUS) ×3 IMPLANT
DERMABOND ADVANCED (GAUZE/BANDAGES/DRESSINGS) ×2
DERMABOND ADVANCED .7 DNX12 (GAUZE/BANDAGES/DRESSINGS) ×1 IMPLANT
DIFFUSER DRILL AIR PNEUMATIC (MISCELLANEOUS) ×3 IMPLANT
DRAPE C-ARM 42X72 X-RAY (DRAPES) ×6 IMPLANT
DRAPE LAPAROTOMY 100X72 PEDS (DRAPES) ×3 IMPLANT
DRAPE MICROSCOPE LEICA (MISCELLANEOUS) IMPLANT
DRILL BIT VUEPOINT II (BIT) ×2
DRILL NEURO 2X3.1 SOFT TOUCH (MISCELLANEOUS)
DRSG OPSITE POSTOP 4X6 (GAUZE/BANDAGES/DRESSINGS) ×3 IMPLANT
DRSG PAD ABDOMINAL 8X10 ST (GAUZE/BANDAGES/DRESSINGS) IMPLANT
DURAPREP 6ML APPLICATOR 50/CS (WOUND CARE) ×3 IMPLANT
ELECT BLADE 4.0 EZ CLEAN MEGAD (MISCELLANEOUS) ×3
ELECT REM PT RETURN 9FT ADLT (ELECTROSURGICAL) ×3
ELECTRODE BLDE 4.0 EZ CLN MEGD (MISCELLANEOUS) ×1 IMPLANT
ELECTRODE REM PT RTRN 9FT ADLT (ELECTROSURGICAL) ×1 IMPLANT
EVACUATOR 1/8 PVC DRAIN (DRAIN) ×3 IMPLANT
GAUZE 4X4 16PLY RFD (DISPOSABLE) IMPLANT
GAUZE SPONGE 4X4 12PLY STRL (GAUZE/BANDAGES/DRESSINGS) ×3 IMPLANT
GLOVE BIO SURGEON STRL SZ8 (GLOVE) ×3 IMPLANT
GLOVE BIOGEL PI IND STRL 8 (GLOVE) ×1 IMPLANT
GLOVE BIOGEL PI IND STRL 8.5 (GLOVE) ×1 IMPLANT
GLOVE BIOGEL PI INDICATOR 8 (GLOVE) ×2
GLOVE BIOGEL PI INDICATOR 8.5 (GLOVE) ×2
GLOVE ECLIPSE 8.0 STRL XLNG CF (GLOVE) ×3 IMPLANT
GLOVE EXAM NITRILE XL STR (GLOVE) IMPLANT
GOWN STRL REUS W/ TWL LRG LVL3 (GOWN DISPOSABLE) IMPLANT
GOWN STRL REUS W/ TWL XL LVL3 (GOWN DISPOSABLE) IMPLANT
GOWN STRL REUS W/TWL 2XL LVL3 (GOWN DISPOSABLE) IMPLANT
GOWN STRL REUS W/TWL LRG LVL3 (GOWN DISPOSABLE)
GOWN STRL REUS W/TWL XL LVL3 (GOWN DISPOSABLE)
HEMOSTAT POWDER KIT SURGIFOAM (HEMOSTASIS) ×3 IMPLANT
HEMOSTAT SURGICEL 2X14 (HEMOSTASIS) IMPLANT
KIT BASIN OR (CUSTOM PROCEDURE TRAY) ×3 IMPLANT
KIT INFUSE XX SMALL 0.7CC (Orthopedic Implant) ×3 IMPLANT
KIT TURNOVER KIT B (KITS) ×3 IMPLANT
MARKER SKIN DUAL TIP RULER LAB (MISCELLANEOUS) ×3 IMPLANT
NEEDLE HYPO 18GX1.5 BLUNT FILL (NEEDLE) IMPLANT
NEEDLE HYPO 25X1 1.5 SAFETY (NEEDLE) ×3 IMPLANT
NEEDLE SPNL 22GX3.5 QUINCKE BK (NEEDLE) ×3 IMPLANT
NS IRRIG 1000ML POUR BTL (IV SOLUTION) ×3 IMPLANT
OIL CARTRIDGE MAESTRO DRILL (MISCELLANEOUS) ×3
PACK LAMINECTOMY NEURO (CUSTOM PROCEDURE TRAY) ×3 IMPLANT
PIN MAYFIELD SKULL DISP (PIN) ×3 IMPLANT
ROD 120MM (Rod) ×4 IMPLANT
ROD SPNL 120X3.5XNS LF TI (Rod) ×2 IMPLANT
SCREW MA MM 3.5X14 (Screw) ×30 IMPLANT
SCREW SET THREADED (Screw) ×30 IMPLANT
SPONGE INTESTINAL PEANUT (DISPOSABLE) IMPLANT
SPONGE SURGIFOAM ABS GEL 100 (HEMOSTASIS) ×3 IMPLANT
SPONGE SURGIFOAM ABS GEL SZ50 (HEMOSTASIS) IMPLANT
STAPLER SKIN PROX WIDE 3.9 (STAPLE) ×3 IMPLANT
SUT ETHILON 3 0 FSL (SUTURE) ×3 IMPLANT
SUT VIC AB 0 CT1 18XCR BRD8 (SUTURE) ×1 IMPLANT
SUT VIC AB 0 CT1 8-18 (SUTURE) ×2
SUT VIC AB 2-0 CP2 18 (SUTURE) ×3 IMPLANT
SUT VIC AB 3-0 SH 8-18 (SUTURE) ×6 IMPLANT
SYR 3ML LL SCALE MARK (SYRINGE) IMPLANT
TOWEL GREEN STERILE (TOWEL DISPOSABLE) ×3 IMPLANT
TOWEL GREEN STERILE FF (TOWEL DISPOSABLE) ×3 IMPLANT
TRAY FOLEY MTR SLVR 16FR STAT (SET/KITS/TRAYS/PACK) IMPLANT
UNDERPAD 30X36 HEAVY ABSORB (UNDERPADS AND DIAPERS) ×3 IMPLANT
WATER STERILE IRR 1000ML POUR (IV SOLUTION) ×3 IMPLANT

## 2019-06-08 NOTE — Anesthesia Procedure Notes (Signed)
Procedure Name: Intubation Date/Time: 06/08/2019 7:40 AM Performed by: Renato Shin, CRNA Pre-anesthesia Checklist: Patient identified, Emergency Drugs available, Suction available and Patient being monitored Patient Re-evaluated:Patient Re-evaluated prior to induction Oxygen Delivery Method: Circle system utilized Preoxygenation: Pre-oxygenation with 100% oxygen Induction Type: IV induction Ventilation: Mask ventilation without difficulty Laryngoscope Size: Glidescope and 4 Grade View: Grade I Tube type: Oral Tube size: 8.0 mm Number of attempts: 1 Airway Equipment and Method: Stylet and Oral airway Placement Confirmation: ETT inserted through vocal cords under direct vision,  positive ETCO2 and breath sounds checked- equal and bilateral Secured at: 21 cm Tube secured with: Tape Dental Injury: Teeth and Oropharynx as per pre-operative assessment

## 2019-06-08 NOTE — Interval H&P Note (Signed)
History and Physical Interval Note:  06/08/2019 7:24 AM  Allen [name]  has presented today for surgery, with the diagnosis of Spinal stenosis, Cervical region.  The various methods of treatment have been discussed with the patient and family. After consideration of risks, benefits and other options for treatment, the patient has consented to  Procedure(s) with comments: Cervical 3 to Cervical 7 Posterior cervical decompression and fusion (N/A) - 3C as a surgical intervention.  The patient's history has been reviewed, patient examined, no change in status, stable for surgery.  I have reviewed the patient's chart and labs.  Questions were answered to the patient's satisfaction.     [name]

## 2019-06-08 NOTE — Transfer of Care (Signed)
Immediate Anesthesia Transfer of Care Note  Patient: [place]  Procedure(s) Performed: Cervical Three to Cervical Seven Posterior cervical decompression and fusion (N/A )  Patient Location: PACU  Anesthesia Type:General  Level of Consciousness: awake and patient cooperative  Airway & Oxygen Therapy: Patient Spontanous Breathing and Patient connected to face mask oxygen  Post-op Assessment: Report given to RN and Post -op Vital signs reviewed and stable  Post vital signs: Reviewed and stable  Last Vitals:  Vitals Value Taken Time  BP 160/79 06/08/19 1033  Temp    Pulse 87 06/08/19 1036  Resp 12 06/08/19 1036  SpO2 100 % 06/08/19 1036  Vitals shown include unvalidated device data.  Last Pain:  Vitals:   06/08/19 0619  PainSc: 5          Complications: No apparent anesthesia complications

## 2019-06-08 NOTE — Brief Op Note (Signed)
06/08/2019  10:32 AM  PATIENT:  Allen [name]  69 y.o. male  PRE-OPERATIVE DIAGNOSIS:  Spinal stenosis, Cervical region, cervical kyphosis, cervical pseudoarthrosis, cervical radiculopathy, cervicalgia  POST-OPERATIVE DIAGNOSIS:   Spinal stenosis, Cervical region, cervical kyphosis, cervical pseudoarthrosis, cervical radiculopathy, cervicalgia  PROCEDURE:  Procedure(s) with comments: Cervical Three to Cervical Seven Posterior cervical decompression and fusion (N/A) - Cervical Three to Cervical Seven Posterior cervical decompression and fusion with posterolateral arthrodesis  SURGEON:  Surgeon(s) and Role:    Erline Levine, MD - Primary    * Judith Part, MD - Assisting  PHYSICIAN ASSISTANT:   ASSISTANTS: Poteat, RN   ANESTHESIA:   general  EBL:  50 mL   BLOOD ADMINISTERED:none  DRAINS: (Medium) Hemovact drain(s) in the epidural space with  Suction Open   LOCAL MEDICATIONS USED:  MARCAINE     SPECIMEN:  No Specimen  DISPOSITION OF SPECIMEN:  N/A  COUNTS:  YES  TOURNIQUET:  * No tourniquets in log *  DICTATION: DICTATION: Patient is 69 year old man with cervical stenosis, cervical kyphosis, cervical pseudoarthrosis and cervical myelopathy, radiculopathy and neck pain.  It was elected to take the patient to surgery for posterior cervical decompression and fusion.   PROCEDURE: Patient was brought to the OR and following smooth and uncomplicated induction of GETA using Glide Scope, patient was placed in 3 pin fixation and rolled into a prone position on the OR table in the cervical collar.  Shoulders were taped to facilitate Xray visualization. Posterior neck was  prepped and draped in the usual fashion with betadine scrub followed by Duraprep.  Area of planned incision was infiltrated with local lidocaine.  Incision was made from C3-C7 and carried through the avascular midline plane to expose these lavels and their lateral masses.  There was significant arthritis at  each of these levels with facet arthropathy and bony excrescences.  Lateral mass screws were placed from C3 to C7 bilaterally according to standard landmarks and their positioning was confirmed with fluoroscopy.  The facet joints and laminae were decorticated.  Screws and rods were locked down in situ.  The spinous processes of C4-C6 were then removed and the laminae were thinned. A total laminectomy was performed with undercutting of the C 4 lamina all the way to the top of C 6 and the foraminae were also decompressed at each level. The spinal cord dura appeared to be pulsatile and well decompressed.  Hemostasis was assured and a medium Hemovac drain was placed in the epidural space. Posterolateral region was decorticated and packed with extra extra small BMP and autograft.  Wound was closed with 0, 2-0, and 3-0 vicryl sutures were used to re approximate the skin edges. Sterile occlusive dressing was placed with Dermabond and an occlusive dressing.  Patient was taken out of pins and turned back onto the OR table.  Patient was extubated in the operating room and taken to recovery in stable and satisfactory condition.  Counts were correct at the end of the case.  PLAN OF CARE: Admit for overnight observation  PATIENT DISPOSITION:  PACU - hemodynamically stable.   Delay start of Pharmacological VTE agent (>24hrs) due to surgical blood loss or risk of bleeding: yes

## 2019-06-08 NOTE — H&P (Signed)
Patient ID:   [id number] Patient: Allen [name]  Date of Birth: [birth year] Visit Type: Office Visit   Date: 05/06/2019 09:00 AM Provider: Marchia Meiers. Vertell Limber MD   This 69 year old male presents for neck pain.  HISTORY OF PRESENT ILLNESS: 1.  neck pain  The patient returns to review his CT scan. This shows auto fusion at the C2-3 level with settling of corpectomy graft into the C4 vertebra without loosening of screws and with significant foraminal stenosis at the C4-5 level.  There appears to be solid fusion C5 through C7 levels related to his prior surgery.  There is incomplete fusion at the C3-4 level  Based on my review of the CT scan and the MRI, I have recommended proceeding with posterior decompression and stabilization surgery from C3 through C7 levels with a laminectomy at the site of kyphotic deformity and foraminotomies overlying the C5 nerve roots.  I explained to the patient the risk of injury to already damaged C5 nerve roots.  He is aware of this and other risks.  He wishes to good with surgery      Medical/Surgical/Interim History Reviewed, no change.     Family History: Reviewed, no changes.    Social History: Reviewed, no changes.   MEDICATIONS: (added, continued or stopped this visit) Started Medication Directions Instruction Stopped  amlodipine 10 mg tablet take 1 tablet by oral route  every day    ASMANEX inhale 2 puff by inhalation route  every day in the evening   04/08/2019 baclofen 10 mg tablet take 1 tablet by oral route 3 times every day prn spasm    Calcium 500  500 mg calcium (1,250 mg) tablet    04/08/2019 hydrocodone 5 mg-acetaminophen 325 mg tablet take 1 tablet by oral route  every 6 hours as needed for pain    levocetirizine 5 mg tablet take 1 tablet by oral route  every day in the evening    magnesium 400 mg (as magnesium oxide) capsule     metformin 1,000 mg tablet take 1 tablet by oral route 2 times every  day with morning and evening meals    metoclopramide 5 mg tablet take 1 tablet by oral route 3 times every day 30 minutes before meals and at bedtime    multivitamin    04/08/2019 Neurontin 300 mg capsule take 1 capsule by oral route 3 times every day    olmesartan 40 mg tablet take 1 tablet by oral route  every day    Trulicity [id number] mL subcutaneous pen injector inject 0.5 milliliter by subcutaneous route  every week in the abdomen, thigh, or upper arm rotating injection sites    Vitamin D3  ORAL 800units      ALLERGIES: Ingredient Reaction Medication Name Comment NO KNOWN ALLERGIES    No known allergies.    PHYSICAL EXAM:  Vitals Date Temp F BP Pulse Ht In Wt Lb BMI BSA Pain Score 05/06/2019 96.9 154/77 65 71 222 30.96  5/10     IMPRESSION:  Cervical kyphosis with cord compression at C3-4 and pseudoarthrosis at the C4 to C6 level with foraminal stenosis at C4-5 and auto fusion of C2 on C3  PLAN: Proceed with posterior decompression and stabilization C3-C7 levels  Orders: Diagnostic Procedures: Assessment Procedure M48.02 Cervical Spine- AP/Lat Instruction(s)/Education: Assessment Instruction I10 Hypertension education Z68.30 Dietary management education, guidance, and counseling Miscellaneous: Assessment  M48.02 Hard Cervical Collar  Completed Orders (this encounter) Order Details Reason Side Interpretation Result Initial Treatment  Date Region Dietary management education, guidance, and counseling Eat a well rounded diet high in fruit and vegetables       Hypertension education Follow-up with primary physician        Assessment/Plan  # Detail Type Description  1. Assessment Spinal stenosis, cervical region (M48.02).  Plan Orders Hard Cervical Collar.     2. Assessment Radiculopathy, cervical region (M54.12).     3. Assessment Essential (primary) hypertension  (I10).     4. Assessment Body mass index (BMI) 30.0-30.9, adult (Z68.30).  Plan Orders Today's instructions / counseling include(s) Dietary management education, guidance, and counseling. Clinical information/comments: Eat a well rounded diet high in fruit and vegetables.       Pain Management Plan Location: neck. Onset: 03/03/2018. Duration: varies. Quality: discomforting. Pain management follow-up plan of care: Patient is to use pain medication as directed.              Provider:  Marchia Meiers. Vertell Limber MD  05/06/2019 09:38 AM    Dictation edited by: Marchia Meiers. Vertell Limber    CC Providers: [facility] [address] [place]  [address]   Erline Levine MD  [address] [place], [place] [address]               Electronically signed by Marchia Meiers. Vertell Limber MD on 05/06/2019 09:38 AM

## 2019-06-08 NOTE — Op Note (Signed)
06/08/2019  10:32 AM  PATIENT:  Allen [name]  69 y.o. male  PRE-OPERATIVE DIAGNOSIS:  Spinal stenosis, Cervical region, cervical kyphosis, cervical pseudoarthrosis, cervical radiculopathy, cervicalgia  POST-OPERATIVE DIAGNOSIS:   Spinal stenosis, Cervical region, cervical kyphosis, cervical pseudoarthrosis, cervical radiculopathy, cervicalgia  PROCEDURE:  Procedure(s) with comments: Cervical Three to Cervical Seven Posterior cervical decompression and fusion (N/A) - Cervical Three to Cervical Seven Posterior cervical decompression and fusion with posterolateral arthrodesis  SURGEON:  Surgeon(s) and Role:    Erline Levine, MD - Primary    * Judith Part, MD - Assisting  PHYSICIAN ASSISTANT:   ASSISTANTS: Poteat, RN   ANESTHESIA:   general  EBL:  50 mL   BLOOD ADMINISTERED:none  DRAINS: (Medium) Hemovact drain(s) in the epidural space with  Suction Open   LOCAL MEDICATIONS USED:  MARCAINE     SPECIMEN:  No Specimen  DISPOSITION OF SPECIMEN:  N/A  COUNTS:  YES  TOURNIQUET:  * No tourniquets in log *  DICTATION: DICTATION: Patient is 69 year old man with cervical stenosis, cervical kyphosis, cervical pseudoarthrosis and cervical myelopathy, radiculopathy and neck pain.  It was elected to take the patient to surgery for posterior cervical decompression and fusion.   PROCEDURE: Patient was brought to the OR and following smooth and uncomplicated induction of GETA using Glide Scope, patient was placed in 3 pin fixation and rolled into a prone position on the OR table in the cervical collar.  Shoulders were taped to facilitate Xray visualization. Posterior neck was  prepped and draped in the usual fashion with betadine scrub followed by Duraprep.  Area of planned incision was infiltrated with local lidocaine.  Incision was made from C3-C7 and carried through the avascular midline plane to expose these lavels and their lateral masses.  There was significant arthritis at  each of these levels with facet arthropathy and bony excrescences.  Lateral mass screws were placed from C3 to C7 bilaterally according to standard landmarks and their positioning was confirmed with fluoroscopy.  The facet joints and laminae were decorticated.  Screws and rods were locked down in situ.  The spinous processes of C4-C6 were then removed and the laminae were thinned. A total laminectomy was performed with undercutting of the C 4 lamina all the way to the top of C 6 and the foraminae were also decompressed at each level. The spinal cord dura appeared to be pulsatile and well decompressed.  Hemostasis was assured and a medium Hemovac drain was placed in the epidural space. Posterolateral region was decorticated and packed with extra extra small BMP and autograft.  Wound was closed with 0, 2-0, and 3-0 vicryl sutures were used to re approximate the skin edges. Sterile occlusive dressing was placed with Dermabond and an occlusive dressing.  Patient was taken out of pins and turned back onto the OR table.  Patient was extubated in the operating room and taken to recovery in stable and satisfactory condition.  Counts were correct at the end of the case.  PLAN OF CARE: Admit for overnight observation  PATIENT DISPOSITION:  PACU - hemodynamically stable.   Delay start of Pharmacological VTE agent (>24hrs) due to surgical blood loss or risk of bleeding: yes

## 2019-06-08 NOTE — Anesthesia Postprocedure Evaluation (Signed)
Anesthesia Post Note  Patient: [place]  Procedure(s) Performed: Cervical Three to Cervical Seven Posterior cervical decompression and fusion (N/A )     Patient location during evaluation: PACU Anesthesia Type: General Level of consciousness: awake Pain management: pain level controlled Vital Signs Assessment: post-procedure vital signs reviewed and stable Respiratory status: spontaneous breathing, nonlabored ventilation, respiratory function stable and patient connected to nasal cannula oxygen Cardiovascular status: blood pressure returned to baseline and stable Postop Assessment: no apparent nausea or vomiting Anesthetic complications: no    Last Vitals:  Vitals:   06/08/19 1201 06/08/19 1549  BP: 131/68 113/83  Pulse: 79 83  Resp: 18 18  Temp: 36.7 C 36.9 C  SpO2: 93% 93%    Last Pain:  Vitals:   06/08/19 1549  TempSrc: Oral  PainSc:                  Reid Nawrot P Carmin Alvidrez

## 2019-06-08 NOTE — Progress Notes (Signed)
Awake, alert, nauseated.  MAEW with good strength.  Patient is doing well.

## 2019-06-09 DIAGNOSIS — M4802 Spinal stenosis, cervical region: Secondary | ICD-10-CM | POA: Diagnosis not present

## 2019-06-09 LAB — GLUCOSE, CAPILLARY
Glucose-Capillary: 112 mg/dL — ABNORMAL HIGH (ref 70–99)
Glucose-Capillary: 130 mg/dL — ABNORMAL HIGH (ref 70–99)
Glucose-Capillary: 162 mg/dL — ABNORMAL HIGH (ref 70–99)
Glucose-Capillary: 274 mg/dL — ABNORMAL HIGH (ref 70–99)

## 2019-06-09 MED ORDER — HYDROCODONE-ACETAMINOPHEN 10-325 MG PO TABS
1.0000 | ORAL_TABLET | ORAL | Status: DC | PRN
  Administered 2019-06-09 – 2019-06-10 (×4): 2 via ORAL
  Filled 2019-06-09 (×4): qty 2

## 2019-06-09 MED ORDER — DIAZEPAM 5 MG PO TABS
5.0000 mg | ORAL_TABLET | Freq: Four times a day (QID) | ORAL | Status: DC | PRN
  Administered 2019-06-09 – 2019-06-10 (×2): 5 mg via ORAL
  Filled 2019-06-09 (×2): qty 1

## 2019-06-09 MED ORDER — HYDROCODONE-ACETAMINOPHEN 10-325 MG PO TABS: 1.0000 | ORAL_TABLET | ORAL | Status: DC | PRN

## 2019-06-09 MED ORDER — METHOCARBAMOL 750 MG PO TABS: 750.0000 mg | ORAL_TABLET | Freq: Three times a day (TID) | ORAL | 1 refills | Status: DC | PRN

## 2019-06-09 MED ORDER — HYDROCODONE-ACETAMINOPHEN 10-325 MG PO TABS: 1.0000 | ORAL_TABLET | Freq: Four times a day (QID) | ORAL | 0 refills | Status: DC | PRN

## 2019-06-09 MED ORDER — KETOROLAC TROMETHAMINE 30 MG/ML IJ SOLN
30.0000 mg | Freq: Four times a day (QID) | INTRAMUSCULAR | Status: DC | PRN
  Administered 2019-06-09 – 2019-06-10 (×4): 30 mg via INTRAVENOUS
  Filled 2019-06-09 (×4): qty 1

## 2019-06-09 NOTE — Discharge Instructions (Signed)
Wound Care Remove dressing in 2 days Leave incision open to air. You may shower. Do not scrub directly on incision.  Do not put any creams, lotions, or ointments on incision. REMOVE GLUE IN 2 WEEKS Activity Walk each and every day, increasing distance each day. No lifting greater than 5 lbs.  Avoid bending, arching, and twisting. No driving for 2 weeks; may ride as a passenger locally.  Diet Resume your normal diet.  Return to Work Will be discussed at you follow up appointment. Call Your Doctor If Any of These Occur Redness, drainage, or swelling at the wound.  Temperature greater than 101 degrees. Severe pain not relieved by pain medication. Incision starts to come apart. Follow Up Appt Call today for appointment in 3-4 weeks [phone]) or for problems.  If you have any hardware placed in your spine, you will need an x-ray before your appointment.

## 2019-06-09 NOTE — Progress Notes (Signed)
Patient ID: Allen [name], male   DOB: [birth year], 69 y.o.   MRN: [id number] Alert, conversant, reporting knifelike posterior cervical pain when changing positions/getting OOB this afternoon. Responding well to Valium and Toradol. Strength remains good BUE.  Mobilizing as pain allows.

## 2019-06-09 NOTE — Evaluation (Signed)
Physical Therapy Evaluation Patient Details Name: Allen [name] MRN: [id number] DOB: [birth year] Today's Date: 06/09/2019   History of Present Illness  Patient is a 69 y/o male who presents s/p C3-7 fusion. PMH includes ACDF, HTN, DM2, CKD, Ca.  Clinical Impression  Patient presents with pain and post surgical deficits s/p above surgery. Pt independent and lives with spouse PTA. Today, pt tolerated bed mobility, transfers, gait training and stair training with supervision-Min guard for safety. Reports feeling more secure using RW at this time and has one he can use at home. Education re: neck precautions, log roll technique, mobility, etc. Will follow acutely to maximize independence and mobility prior to return home.    Follow Up Recommendations No PT follow up;Supervision - Intermittent    Equipment Recommendations  None recommended by PT    Recommendations for Other Services       Precautions / Restrictions Precautions Precautions: Cervical Precaution Booklet Issued: No Precaution Comments: verbally reviewed cervical precautions Required Braces or Orthoses: Cervical Brace Cervical Brace: Hard collar;At all times Restrictions Weight Bearing Restrictions: No      Mobility  Bed Mobility Overal bed mobility: Needs Assistance Bed Mobility: Rolling;Sidelying to Sit;Sit to Sidelying Rolling: Supervision Sidelying to sit: Supervision     Sit to sidelying: Supervision General bed mobility comments: Cues for log roll technique; HOB minimally elevated (bed at home goes up/down). Has been sleeping inr ecliner  Transfers Overall transfer level: Needs assistance Equipment used: Rolling walker (2 wheeled) Transfers: Sit to/from Stand Sit to Stand: Min guard         General transfer comment: Min guard for safety. Cues for hand placement.  Ambulation/Gait Ambulation/Gait assistance: Min guard Gait Distance (Feet): 400 Feet Assistive device: Rolling walker (2  wheeled) Gait Pattern/deviations: Step-through pattern;Decreased stride length;Narrow base of support Gait velocity: decreased Gait velocity interpretation: <1.31 ft/sec, indicative of household ambulator General Gait Details: Slow, guarded gait using RW. No LOB. Worsening HA with mobility.  Stairs Stairs: Yes Stairs assistance: Min assist Stair Management: Forwards;One rail Right Number of Stairs: 2 General stair comments: Cues for safety/technique using HHA to simulate home.  Wheelchair Mobility    Modified Rankin (Stroke Patients Only)       Balance Overall balance assessment: Mild deficits observed, not formally tested                                           Pertinent Vitals/Pain Pain Assessment: 0-10 Pain Score: 3  Pain Location: neck, headache at end of session Pain Descriptors / Indicators: Sore;Operative site guarding;Headache Pain Intervention(s): Monitored during session;Repositioned;Patient requesting pain meds-RN notified    Home Living Family/patient expects to be discharged to:: Private residence Living Arrangements: Spouse/significant other Available Help at Discharge: Family;Available 24 hours/day Type of Home: House Home Access: Stairs to enter   [employer] of Steps: 2 Home Layout: Multi-level;Able to live on main level with bedroom/bathroom Home Equipment: Gilford Rile - 2 wheels;Cane - single point;Bedside commode;Shower seat Additional Comments: equipment is from deceased mother in law    Prior Function Level of Independence: Independent         Comments: having LUE problems before surgery. Retired. No falls in last 6 months. Granddaughter lives next door and is a PT.     Hand Dominance   Dominant Hand: Right    Extremity/Trunk Assessment   Upper Extremity Assessment Upper Extremity Assessment: Defer to  OT evaluation    Lower Extremity Assessment Lower Extremity Assessment: Overall WFL for tasks assessed     Cervical / Trunk Assessment Cervical / Trunk Assessment: Other exceptions Cervical / Trunk Exceptions: s/p neck surgery, collar donned  Communication   Communication: No difficulties  Cognition Arousal/Alertness: Awake/alert Behavior During Therapy: WFL for tasks assessed/performed Overall Cognitive Status: Within Functional Limits for tasks assessed                                        General Comments      Exercises     Assessment/Plan    PT Assessment Patient needs continued PT services  PT Problem List Decreased mobility;Decreased knowledge of precautions;Decreased range of motion;Pain;Decreased balance;Decreased skin integrity       PT Treatment Interventions Therapeutic activities;Gait training;Stair training;Balance training;Therapeutic exercise;Patient/family education    PT Goals (Current goals can be found in the Care Plan section)  Acute Rehab PT Goals Patient Stated Goal: to get back to golf PT Goal Formulation: With patient Time For Goal Achievement: 06/23/19 Potential to Achieve Goals: Good    Frequency Min 5X/week   Barriers to discharge        Co-evaluation               AM-PAC PT "6 Clicks" Mobility  Outcome Measure Help needed turning from your back to your side while in a flat bed without using bedrails?: None Help needed moving from lying on your back to sitting on the side of a flat bed without using bedrails?: None Help needed moving to and from a bed to a chair (including a wheelchair)?: A Little Help needed standing up from a chair using your arms (e.g., wheelchair or bedside chair)?: A Little Help needed to walk in hospital room?: A Little Help needed climbing 3-5 steps with a railing? : A Little 6 Click Score: 20    End of Session Equipment Utilized During Treatment: Cervical collar Activity Tolerance: Patient tolerated treatment well;Patient limited by pain Patient left: in bed;with call bell/phone within  reach Nurse Communication: Mobility status PT Visit Diagnosis: Pain;Difficulty in walking, not elsewhere classified (R26.2) Pain - part of body: (neck, head)    Time: [id number] PT Time Calculation (min) (ACUTE ONLY): 19 min   Charges:   PT Evaluation $PT Eval Moderate Complexity: 1 Mod          Marisa Severin, PT, DPT Acute Rehabilitation Services Pager [phone] Office [phone]      [place] 06/09/2019, 10:52 AM

## 2019-06-09 NOTE — Progress Notes (Addendum)
Subjective: Patient reports "I'm ok... right sore"  Objective: Vital signs in last 24 hours: Temp:  [98 F (36.7 C)-99.3 F (37.4 C)] 98.8 F (37.1 C) (05/18 0729) Pulse Rate:  [68-86] 68 (05/18 0729) Resp:  [11-18] 18 (05/18 0729) BP: (107-173)/(68-88) 143/71 (05/18 0729) SpO2:  [90 %-100 %] 96 % (05/18 0729)  Intake/Output from previous day: 05/17 0701 - 05/18 0700 In: 1910 [P.O.:360; I.V.:1400; IV Piggyback:150] Out: 230 [Drains:180; Blood:50] Intake/Output this shift: No intake/output data recorded.  Sitting on edge of bed, eating breakfast. In good spirits. Good strength BUE. Incision flat without erythema or drainage beneath honeycomb and Dermabond. Hemovac patent (52ml since midnight).    Lab Results: No results for input(s): WBC, HGB, HCT, PLT in the last 72 hours. BMET No results for input(s): NA, K, CL, CO2, GLUCOSE, BUN, CREATININE, CALCIUM in the last 72 hours.  Studies/Results: DG Cervical Spine 1 View  Result Date: 06/08/2019 CLINICAL DATA:  Posterior decompression fusion C3-7 EXAM: DG CERVICAL SPINE - 1 VIEW; DG C-ARM 1-60 MIN COMPARISON:  CT 05/05/2019 FINDINGS: Single lateral intraoperative C-arm fluoroscopic image demonstrates interval placement of posterior fusion hardware C3-C7. Anterior cervical fusion hardware and interbody markers extending inferiorly from C3, incompletely visualized. IMPRESSION: Interval posterior spinal fusion hardware placement Electronically Signed   By: Lucrezia Europe M.D.   On: 06/08/2019 09:41   DG C-Arm 1-60 Min  Result Date: 06/08/2019 CLINICAL DATA:  Posterior decompression fusion C3-7 EXAM: DG CERVICAL SPINE - 1 VIEW; DG C-ARM 1-60 MIN COMPARISON:  CT 05/05/2019 FINDINGS: Single lateral intraoperative C-arm fluoroscopic image demonstrates interval placement of posterior fusion hardware C3-C7. Anterior cervical fusion hardware and interbody markers extending inferiorly from C3, incompletely visualized. IMPRESSION: Interval posterior  spinal fusion hardware placement Electronically Signed   By: Lucrezia Europe M.D.   On: 06/08/2019 09:41    Assessment/Plan: stable  LOS: 1 day  Continue to mobilize in collar. Will monitor drain o/p this am.    Verdis Prime 06/09/2019, 7:31 AM  Strength much improved with full bilateral deltoid strength.  Pain is also improved.  Discharge home later today.

## 2019-06-09 NOTE — Evaluation (Signed)
Occupational Therapy Evaluation and Discharge Patient Details Name: Allen [name] MRN: [id number] DOB: March 10, 1950 Today's Date: 06/09/2019    History of Present Illness Patient is a 69 y/o male who presents s/p C3-7 fusion. PMH includes ACDF, HTN, DM2, CKD, Ca.   Clinical Impression   All education completed with pt verbalizing understanding of cervical precautions, compensatory strategies for ADL and fall prevention. No further OT needs.    Follow Up Recommendations  No OT follow up    Equipment Recommendations  None recommended by OT    Recommendations for Other Services       Precautions / Restrictions Precautions Precautions: Cervical Precaution Booklet Issued: No Precaution Comments: verbally reviewed cervical precautions Required Braces or Orthoses: Cervical Brace Cervical Brace: Hard collar;At all times Restrictions Weight Bearing Restrictions: No      Mobility Bed Mobility Overal bed mobility: Needs Assistance Bed Mobility: Rolling;Sidelying to Sit Rolling: Supervision Sidelying to sit: Supervision       General bed mobility comments: used log roll technique, HOB nearly flat  Transfers Overall transfer level: Needs assistance Equipment used: Rolling walker (2 wheeled) Transfers: Sit to/from Stand Sit to Stand: Min guard;From elevated surface              Balance Overall balance assessment: Mild deficits observed, not formally tested                                         ADL either performed or assessed with clinical judgement   ADL Overall ADL's : Needs assistance/impaired Eating/Feeding: Independent   Grooming: Supervision/safety;Standing Grooming Details (indicate cue type and reason): educated in two cup method for oral care and use of wash cloth on face Upper Body Bathing: Set up;Sitting Upper Body Bathing Details (indicate cue type and reason): recommended long handled bath sponge for back Lower Body Bathing: Min  guard;Sit to/from stand Lower Body Bathing Details (indicate cue type and reason): able to cross foot over opposite knee Upper Body Dressing : Set up;Sitting   Lower Body Dressing: Min guard;Sit to/from stand   Toilet Transfer: Min guard;RW;Ambulation     Toileting - Clothing Manipulation Details (indicate cue type and reason): instructed to avoid twisting with pericare   Tub/Shower Transfer Details (indicate cue type and reason): may plan to sit to shower in walk in shower Functional mobility during ADLs: Min guard;Rolling walker General ADL Comments: Pt does not participate in any housekeeping or meal prep at baseline.     Vision Patient Visual Report: No change from baseline       Perception     Praxis      Pertinent Vitals/Pain Pain Assessment: 0-10 Pain Score: 2  Pain Location: neck Pain Descriptors / Indicators: Sore Pain Intervention(s): Monitored during session;Premedicated before session;Ice applied     Hand Dominance Right   Extremity/Trunk Assessment Upper Extremity Assessment Upper Extremity Assessment: Overall WFL for tasks assessed(reports sensation issues he had prior to surgery are resolve)   Lower Extremity Assessment Lower Extremity Assessment: Defer to PT evaluation       Communication Communication Communication: No difficulties   Cognition Arousal/Alertness: Awake/alert Behavior During Therapy: WFL for tasks assessed/performed Overall Cognitive Status: Within Functional Limits for tasks assessed  General Comments       Exercises     Shoulder Instructions      Home Living Family/patient expects to be discharged to:: Private residence Living Arrangements: Spouse/significant other Available Help at Discharge: Family;Available 24 hours/day Type of Home: House Home Access: Stairs to enter [employer] of Steps: 2   Home Layout: Multi-level;Able to live on main level with  bedroom/bathroom     Bathroom Shower/Tub: Walk-in shower   Bathroom Toilet: Handicapped height     Home Equipment: Environmental consultant - 2 wheels;Cane - single point;Bedside commode;Shower seat   Additional Comments: equipment is from deceased mother in law      Prior Functioning/Environment Level of Independence: Independent                 OT Problem List:        OT Treatment/Interventions:      OT Goals(Current goals can be found in the care plan section) Acute Rehab OT Goals Patient Stated Goal: to not need any more surgeries  OT Frequency:     Barriers to D/C:            Co-evaluation              AM-PAC OT "6 Clicks" Daily Activity     Outcome Measure Help from another person eating meals?: None Help from another person taking care of personal grooming?: A Little Help from another person toileting, which includes using toliet, bedpan, or urinal?: A Little Help from another person bathing (including washing, rinsing, drying)?: A Little Help from another person to put on and taking off regular upper body clothing?: None Help from another person to put on and taking off regular lower body clothing?: A Little 6 Click Score: 20   End of Session Equipment Utilized During Treatment: Rolling walker;Cervical collar  Activity Tolerance: Patient tolerated treatment well Patient left: in bed;with call bell/phone within reach  OT Visit Diagnosis: Pain;Other abnormalities of gait and mobility (R26.89)                Time: [id number] OT Time Calculation (min): 15 min Charges:  OT General Charges $OT Visit: 1 Visit OT Evaluation $OT Eval Low Complexity: 1 Low  Nestor Lewandowsky, OTR/L Acute Rehabilitation Services Pager: [phone] Office: [phone]  Malka So 06/09/2019, 9:17 AM

## 2019-06-09 NOTE — Care Management Obs Status (Signed)
[place] NOTIFICATION   Patient Details  Name: Allen [name] MRN: [id number] Date of Birth: 07/15/1950   Medicare Observation Status Notification Given:  Yes    Carles Collet, RN 06/09/2019, 3:12 PM

## 2019-06-10 ENCOUNTER — Encounter: Payer: Self-pay | Admitting: *Deleted

## 2019-06-10 DIAGNOSIS — M4802 Spinal stenosis, cervical region: Secondary | ICD-10-CM | POA: Diagnosis not present

## 2019-06-10 LAB — GLUCOSE, CAPILLARY: Glucose-Capillary: 115 mg/dL — ABNORMAL HIGH (ref 70–99)

## 2019-06-10 NOTE — Plan of Care (Signed)
Patient alert and oriented, mae's well, voiding adequate amount of urine, swallowing without difficulty, no c/o pain at time of discharge. Patient discharged home with family. Script and discharged instructions given to patient. Patient and family stated understanding of instructions given. Patient has an appointment with Dr.Stern    

## 2019-06-10 NOTE — Progress Notes (Signed)
Physical Therapy Treatment Patient Details Name: Allen [name] MRN: [id number] DOB: June 27, 1950 Today's Date: 06/10/2019    History of Present Illness Patient is a 69 y/o male who presents s/p C3-7 fusion. PMH includes ACDF, HTN, DM2, CKD, Ca.    PT Comments    Pt mobilized well with PT this day, requiring supervision level of assist for all mobility. Pt applied cervical precautions well during mobility, and pt with great ambulation distance with use of RW. Pt prefers to use RW at this time for steadiness post-operatively, PT placed order for pt to receive one. Pt to d/c home today.    Follow Up Recommendations  No PT follow up;Supervision - Intermittent     Equipment Recommendations  Rolling walker with 5" wheels    Recommendations for Other Services       Precautions / Restrictions Precautions Precautions: Cervical Precaution Booklet Issued: (pt states he was given a handout, wife took it home with her) Precaution Comments: Verbal and practiced review of log roll in and out of bed, use of cervical collar at all times, no lifting, and no twisting, arching, bending neck/back Required Braces or Orthoses: Cervical Brace Cervical Brace: Hard collar;At all times Restrictions Weight Bearing Restrictions: No    Mobility  Bed Mobility Overal bed mobility: Needs Assistance Bed Mobility: Rolling;Sidelying to Sit;Sit to Sidelying Rolling: Supervision Sidelying to sit: Supervision     Sit to sidelying: Supervision General bed mobility comments: for safety, good execution of log roll with increased time to come to sitting from sidelying.  Transfers Overall transfer level: Needs assistance Equipment used: Rolling walker (2 wheeled) Transfers: Sit to/from Stand Sit to Stand: Supervision         General transfer comment: for safety, x1 VC for hand placement when rising.  Ambulation/Gait Ambulation/Gait assistance: Supervision Gait Distance (Feet): 450 Feet Assistive  device: Rolling walker (2 wheeled) Gait Pattern/deviations: Step-through pattern;Decreased stride length;Narrow base of support;Shuffle Gait velocity: decr   General Gait Details: supervision for safety, slow and steady gait with no overt LOB. Verbal cuing for upright posture, looking down hallway as opposed to down at feet.   Stairs Stairs: (practiced yesterday, pt declines practicing steps again)           Wheelchair Mobility    Modified Rankin (Stroke Patients Only)       Balance Overall balance assessment: Mild deficits observed, not formally tested                                          Cognition Arousal/Alertness: Awake/alert Behavior During Therapy: WFL for tasks assessed/performed Overall Cognitive Status: Within Functional Limits for tasks assessed                                        Exercises Other Exercises Other Exercises: Pt education: walking program - up and walking 3-5 minutes every waking hour at home with supervision of wife for improving circulation, decreasing neck pain and stiffness    General Comments        Pertinent Vitals/Pain Pain Assessment: Faces Faces Pain Scale: Hurts a little bit Pain Location: neck, posteriorly Pain Descriptors / Indicators: Discomfort;Operative site guarding;Sore Pain Intervention(s): Limited activity within patient's tolerance;Monitored during session;Repositioned    Home Living  Prior Function            PT Goals (current goals can now be found in the care plan section) Acute Rehab PT Goals Patient Stated Goal: to get back to golf PT Goal Formulation: With patient Time For Goal Achievement: 06/23/19 Potential to Achieve Goals: Good Progress towards PT goals: Progressing toward goals    Frequency    Min 5X/week      PT Plan Current plan remains appropriate    Co-evaluation              AM-PAC PT "6 Clicks" Mobility    Outcome Measure  Help needed turning from your back to your side while in a flat bed without using bedrails?: None Help needed moving from lying on your back to sitting on the side of a flat bed without using bedrails?: None Help needed moving to and from a bed to a chair (including a wheelchair)?: None Help needed standing up from a chair using your arms (e.g., wheelchair or bedside chair)?: A Little Help needed to walk in hospital room?: A Little Help needed climbing 3-5 steps with a railing? : A Little 6 Click Score: 21    End of Session Equipment Utilized During Treatment: Cervical collar Activity Tolerance: Patient tolerated treatment well Patient left: in bed;with call bell/phone within reach Nurse Communication: Mobility status PT Visit Diagnosis: Pain;Difficulty in walking, not elsewhere classified (R26.2) Pain - part of body: (neck, head)     Time: [id number] PT Time Calculation (min) (ACUTE ONLY): 22 min  Charges:  $Gait Training: 8-22 mins                    Brenee Gajda E, [place] Pager [phone]  Office [phone]   Viana Sleep D Elonda Husky 06/10/2019, 2:46 PM

## 2019-06-10 NOTE — Discharge Summary (Signed)
Physician Discharge Summary  Patient ID: Allen [name] MRN: [id number] DOB/AGE: Jun 16, 1950 69 y.o.  Admit date: 06/08/2019 Discharge date: 06/10/2019  Admission Diagnoses:Spinal stenosis, Cervical region, cervical kyphosis, cervical pseudoarthrosis, cervical radiculopathy, cervicalgia    Discharge Diagnoses: Spinal stenosis, Cervical region, cervical kyphosis, cervical pseudoarthrosis, cervical radiculopathy, cervicalgia s/p Cervical Three to Cervical Seven Posterior cervical decompression and fusion (N/A) - Cervical Three to Cervical Seven Posterior cervical decompression and fusion with posterolateral arthrodesis     Active Problems:   Cervical pseudoarthrosis [name]   Discharged Condition: good  Hospital Course:  Allen [name] was admitted for surgery with diagnosis of cervical stenosis and radiculopathy.  Following uncomplicated posterior cervical decompression and fusion, he recovered nicely and transferred to 3 central for nursing care and therapies.  He is mobilizing well,  With good upper extremity strength bilaterally.  Consults: None  Significant Diagnostic Studies: radiology: X-Ray: intra-op  Treatments: surgery: Cervical Three to Cervical Seven Posterior cervical decompression and fusion (N/A) - Cervical Three to Cervical Seven Posterior cervical decompression and fusion with posterolateral arthrodesis    Discharge Exam: Blood pressure 91/68, pulse 83, temperature 98.4 F (36.9 C), temperature source Oral, resp. rate 18, height 5\' 11"  (1.803 m), weight 81.6 kg, SpO2 90 %. Alert and conversant, sitting on bedside eating breakfast. Good strength BUE. posterior cervical pain persist, controlled with p.o. medications. Incision is flat without erythema or drainage, Beneath Dermabond and honeycomb dressing..    Disposition: Discharge disposition: 01-Home or Self Care  Patient verbalizes discharge instructions. He already has postop  appointment with office. Prescriptions for Norco and Robaxin have been sent to his pharmacy.      Discharge Instructions    Diet - low sodium heart healthy   Complete by: As directed    Increase activity slowly   Complete by: As directed      Allergies as of 06/10/2019   No Known Allergies     Medication List    TAKE these medications   acetaminophen 500 MG tablet Commonly known as: TYLENOL Take 1,000 mg by mouth every 8 (eight) hours as needed for mild pain.   amLODipine 10 MG tablet Commonly known as: NORVASC Take 10 mg by mouth every evening.   CALCIUM 500+D3 PO Take 1 tablet by mouth 3 (three) times daily.   calcium carbonate 500 MG chewable tablet Commonly known as: TUMS - dosed in mg elemental calcium Chew 1-2 tablets by mouth 3 (three) times daily as needed for indigestion or heartburn.   escitalopram 20 MG tablet Commonly known as: LEXAPRO Take 20 mg by mouth daily.   gabapentin 300 MG capsule Commonly known as: NEURONTIN Take 300 mg by mouth at bedtime.   HYDROcodone-acetaminophen 5-325 MG tablet Commonly known as: NORCO/VICODIN Take 1-2 tablets by mouth every 4 (four) hours as needed for moderate pain ((score 7 to 10)). What changed: Another medication with the same name was added. Make sure you understand how and when to take each.   HYDROcodone-acetaminophen 10-325 MG tablet Commonly known as: NORCO Take 1-2 tablets by mouth every 6 (six) hours as needed for moderate pain. What changed: You were already taking a medication with the same name, and this prescription was added. Make sure you understand how and when to take each.   levocetirizine 5 MG tablet Commonly known as: XYZAL Take 5 mg by mouth daily.   Magnesium 400 MG Caps Take 400 mg by mouth daily.   metFORMIN 500 MG tablet Commonly known as: GLUCOPHAGE Take 1 tablet (500  mg total) by mouth daily with breakfast.   methocarbamol 750 MG tablet Commonly known as: ROBAXIN Take 1  tablet (750 mg total) by mouth every 8 (eight) hours as needed for muscle spasms.   metoCLOPramide 5 MG tablet Commonly known as: REGLAN Take 5 mg by mouth 3 (three) times daily.   multivitamin with minerals Tabs tablet Take 1 tablet by mouth daily.   olmesartan-hydrochlorothiazide 40-12.5 MG tablet Commonly known as: BENICAR HCT Take 1 tablet by mouth daily.   omeprazole 20 MG capsule Commonly known as: PRILOSEC Take 20 mg by mouth every evening.   ondansetron 4 MG tablet Commonly known as: ZOFRAN Take 1 tablet (4 mg total) by mouth every 8 (eight) hours as needed. What changed: reasons to take this   OZEMPIC (0.25 OR 0.5 MG/DOSE) [place] Inject 0.5 mg into the skin every Sunday.   Vitamin B-12 3000 MCG Subl Take 3,000 mcg by mouth daily.   XOLAIR [place] Inject 1 Dose into the skin every 14 (fourteen) days.            Durable Medical Equipment  (From admission, onward)         Start     Ordered   06/10/19 0805  For home use only DME Walker  Once    Question:  Patient needs a walker to treat with the following condition  Answer:  Spinal stenosis in cervical region   06/10/19 0807         Follow-up Information    Allen Levine, MD Follow up.   Specialty: Neurosurgery Contact information: [address] [place] [address] [place] [address] [phone]           Signed: [name], MD 06/10/2019, 8:45 AM

## 2019-06-10 NOTE — Progress Notes (Addendum)
Subjective: Patient reports Feel okay as long as I take my medicine  Objective: Vital signs in last 24 hours: Temp:  [98.2 F (36.8 C)-98.6 F (37 C)] 98.4 F (36.9 C) (05/19 0746) Pulse Rate:  [66-83] 83 (05/19 0746) Resp:  [18-20] 18 (05/19 0746) BP: (91-159)/(67-84) 91/68 (05/19 0746) SpO2:  [90 %-97 %] 90 % (05/19 0746)  Intake/Output from previous day: 05/18 0701 - 05/19 0700 In: 600 [P.O.:600] Out: 25 [Drains:25] Intake/Output this shift: No intake/output data recorded.  Alert and conversant, sitting on bedside eating breakfast.  Good strength BUE.  posterior cervical pain persist, controlled with p.o. medications.   Incision is flat without erythema or drainage,   Beneath Dermabond and honeycomb dressing..  Lab Results: No results for input(s): WBC, HGB, HCT, PLT in the last 72 hours. BMET No results for input(s): NA, K, CL, CO2, GLUCOSE, BUN, CREATININE, CALCIUM in the last 72 hours.  Studies/Results: DG Cervical Spine 1 View  Result Date: 06/08/2019 CLINICAL DATA:  Posterior decompression fusion C3-7 EXAM: DG CERVICAL SPINE - 1 VIEW; DG C-ARM 1-60 MIN COMPARISON:  CT 05/05/2019 FINDINGS: Single lateral intraoperative C-arm fluoroscopic image demonstrates interval placement of posterior fusion hardware C3-C7. Anterior cervical fusion hardware and interbody markers extending inferiorly from C3, incompletely visualized. IMPRESSION: Interval posterior spinal fusion hardware placement Electronically Signed   By: Lucrezia Europe M.D.   On: 06/08/2019 09:41   DG C-Arm 1-60 Min  Result Date: 06/08/2019 CLINICAL DATA:  Posterior decompression fusion C3-7 EXAM: DG CERVICAL SPINE - 1 VIEW; DG C-ARM 1-60 MIN COMPARISON:  CT 05/05/2019 FINDINGS: Single lateral intraoperative C-arm fluoroscopic image demonstrates interval placement of posterior fusion hardware C3-C7. Anterior cervical fusion hardware and interbody markers extending inferiorly from C3, incompletely visualized. IMPRESSION:  Interval posterior spinal fusion hardware placement Electronically Signed   By: Lucrezia Europe M.D.   On: 06/08/2019 09:41    Assessment/Plan:  improving  LOS: 1 day  Okay to discharge home per Dr. Vertell Limber.  Patient verbalizes discharge instructions.  He already has postop appointment with office.   Prescriptions for Norco and Robaxin have been sent to his pharmacy.   Verdis Prime 06/10/2019, 8:07 AM   Patient is doing well.  Bilateral deltoid strength much improved, as is pain in neck.

## 2019-09-11 ENCOUNTER — Other Ambulatory Visit: Payer: Self-pay | Admitting: "Endocrinology

## 2019-09-11 LAB — BASIC METABOLIC PANEL
BUN: 21 (ref 4–21)
Creatinine: 1.6 — AB (ref 0.6–1.3)
Potassium: 4 (ref 3.4–5.3)

## 2019-09-11 LAB — COMPREHENSIVE METABOLIC PANEL
Calcium: 9.6 (ref 8.7–10.7)
GFR calc Af Amer: 50
GFR calc non Af Amer: 43

## 2019-09-11 LAB — VITAMIN D 25 HYDROXY (VIT D DEFICIENCY, FRACTURES): Vit D, 25-Hydroxy: 43.7

## 2019-09-12 LAB — COMPREHENSIVE METABOLIC PANEL
ALT: 16 IU/L (ref 0–44)
AST: 18 IU/L (ref 0–40)
Albumin/Globulin Ratio: 1.8 (ref 1.2–2.2)
Albumin: 4.5 g/dL (ref 3.8–4.8)
Alkaline Phosphatase: 69 IU/L (ref 48–121)
BUN/Creatinine Ratio: 13 (ref 10–24)
BUN: 21 mg/dL (ref 8–27)
Bilirubin Total: 1.3 mg/dL — ABNORMAL HIGH (ref 0.0–1.2)
CO2: 24 mmol/L (ref 20–29)
Calcium: 9.6 mg/dL (ref 8.6–10.2)
Chloride: 103 mmol/L (ref 96–106)
Creatinine, Ser: 1.6 mg/dL — ABNORMAL HIGH (ref 0.76–1.27)
GFR calc Af Amer: 50 mL/min/{1.73_m2} — ABNORMAL LOW (ref >59)
GFR calc non Af Amer: 43 mL/min/{1.73_m2} — ABNORMAL LOW (ref >59)
Globulin, Total: 2.5 g/dL (ref 1.5–4.5)
Glucose: 127 mg/dL — ABNORMAL HIGH (ref 65–99)
Potassium: 4 mmol/L (ref 3.5–5.2)
Sodium: 142 mmol/L (ref 134–144)
Total Protein: 7 g/dL (ref 6.0–8.5)

## 2019-09-12 LAB — SPECIMEN STATUS REPORT

## 2019-09-12 LAB — VITAMIN D 25 HYDROXY (VIT D DEFICIENCY, FRACTURES): Vit D, 25-Hydroxy: 43.7 ng/mL (ref 30.0–100.0)

## 2019-09-22 ENCOUNTER — Ambulatory Visit (INDEPENDENT_AMBULATORY_CARE_PROVIDER_SITE_OTHER): Payer: Medicare Other | Admitting: "Endocrinology

## 2019-09-22 ENCOUNTER — Encounter: Payer: Self-pay | Admitting: "Endocrinology

## 2019-09-22 ENCOUNTER — Other Ambulatory Visit: Payer: Self-pay

## 2019-09-22 VITALS — BP 108/74 | HR 68 | Ht 71.0 in | Wt 167.0 lb

## 2019-09-22 DIAGNOSIS — E1165 Type 2 diabetes mellitus with hyperglycemia: Secondary | ICD-10-CM

## 2019-09-22 LAB — POCT GLYCOSYLATED HEMOGLOBIN (HGB A1C): Hemoglobin A1C: 5.7 % — AB (ref 4.0–5.6)

## 2019-09-22 NOTE — Patient Instructions (Signed)

## 2019-09-22 NOTE — Progress Notes (Signed)
09/22/2019   Endocrinology follow-up note   Subjective:    Patient ID: Allen [name], male    DOB: [birth year]. Patient is being seen in follow-up for the management of type 2 diabetes.    Vasireddy, Lanetta Inch, MD  Past Medical History:  Diagnosis Date  . Anemia    on iron since gastric bypass  . Arthritis    mild  . Asthma   . Chronic kidney disease    stage 3, now resolved since weight loss  . Claustrophobia   . Diabetes mellitus, type II ([place])    type 2 metformin  . GERD (gastroesophageal reflux disease)    takes nexium  . Hyperlipidemia   . Hypertension   . Pneumonia 15 yrs ago  . SCC (squamous cell carcinoma) 12/29/2007   Left Mid Preauricular(Mod.Diff) (Dr. Lacinda Axon)  . SCC (squamous cell carcinoma) 12/29/2007   Right Upper Dorsal Nose(Well Diff) (Dr. Lacinda Axon)  . SCC (squamous cell carcinoma) 12/29/2007   Anti-Tragus Right Ear(Mod.Diff) (Dr. Lacinda Axon)  . SCC (squamous cell carcinoma) 06/06/2010   Left Nose(in Situ) (curet and 5FU)  . SCC (squamous cell carcinoma) 06/06/2010   Right Check(Well Diff) (curet and 5FU)  . SCC (squamous cell carcinoma) 07/20/2013   Right Sideburn(KA) (curet and 5FU)  . SCC (squamous cell carcinoma) 06/27/2015   Right Neck Sup(in Situ) (curet and 5FU)  . SCC (squamous cell carcinoma) 06/27/2015   Right Neck Inf(in Situ) (curet and 5FU)  . SCC (squamous cell carcinoma) 01/02/2017   Left Wrist(in Situ) (curet and 5FU)  . SCC (squamous cell carcinoma) 01/02/2017   Right Outer Cheek(in Situ) (curet and 5FU)  . SCC (squamous cell carcinoma) 06/23/2018   Left Sideburn(in Situ) (curet and 5FU)  . SCC (squamous cell carcinoma) 06/23/2018   Left Upper Lip(Well Diff) (MOH's)  . SCC (squamous cell carcinoma) 06/23/2018   Right Ear Rim(in Situ) (curet and 5FU)  . Sleep apnea    cpap, no cpap since may 2018 weight loss 80 labs   . Superficial nodular basal cell carcinoma (BCC) 06/27/2015   Left Rim of Ear (curet and 5FU)   Past Surgical  History:  Procedure Laterality Date  . ANTERIOR CERVICAL DECOMP/DISCECTOMY FUSION Right 10/10/2018   Procedure: Right sided revision with hardware removal at Cervical three-four, Cervical four-five Anterior cervical decompression/discectomy/fusion with corpectomy at Cervical five;  Surgeon: Erline Levine, MD;  Location: [facility];  Service: Neurosurgery;  Laterality: Right;  . CARDIAC CATHETERIZATION     patient reports he thinks he had one over 25 years ago  . carpal tunnel Bilateral   . CERVICAL VERTEBRAE EXCISION     and cleaning  . CHOLECYSTECTOMY    . cyst removed left hand  01-28-17  . ESOPHAGOGASTRODUODENOSCOPY (EGD) WITH PROPOFOL N/A 01/31/2017   Procedure: ESOPHAGOGASTRODUODENOSCOPY (EGD) WITH PROPOFOL;  Surgeon: Alphonsa Overall, MD;  Location: [facility];  Service: General;  Laterality: N/A;  . EYE SURGERY     bilateral cataract removal  . GASTRIC ROUX-EN-Y N/A 06/04/2016   Procedure: LAPAROSCOPIC ROUX-EN-Y GASTRIC BYPASS WITH UPPER ENDOSCOPY;  Surgeon: Clovis Riley, MD;  Location: [facility];  Service: General;  Laterality: N/A;  . NASAL SINUS SURGERY    . POSTERIOR CERVICAL FUSION/FORAMINOTOMY N/A 06/08/2019   Procedure: Cervical Three to Cervical Seven Posterior cervical decompression and fusion;  Surgeon: Erline Levine, MD;  Location: [facility];  Service: Neurosurgery;  Laterality: N/A;  Cervical Three to Cervical Seven Posterior cervical decompression and fusion  . RADIOLOGY WITH ANESTHESIA N/A 04/28/2019  Procedure: MRI RADIOLOGY WITH ANESTHESIA;  Surgeon: Radiologist, Medication, MD;  Location: [facility];  Service: Radiology;  Laterality: N/A;  . TRIGGER FINGER RELEASE     Social History   Socioeconomic History  . Marital status: Married    Spouse name: Not on file  . Number of children: Not on file  . Years of education: Not on file  . Highest education level: Not on file  Occupational History  . Not on file  Tobacco Use  . Smoking status: Never Smoker  . Smokeless tobacco: Never  Used  Vaping Use  . Vaping Use: Never used  Substance and Sexual Activity  . Alcohol use: No  . Drug use: No  . Sexual activity: Yes  Other Topics Concern  . Not on file  Social History Narrative  . Not on file   Social Determinants of Health   Financial Resource Strain:   . Difficulty of Paying Living Expenses: Not on file  Food Insecurity:   . Worried About Charity fundraiser in the Last Year: Not on file  . Ran Out of Food in the Last Year: Not on file  Transportation Needs:   . Lack of Transportation (Medical): Not on file  . Lack of Transportation (Non-Medical): Not on file  Physical Activity:   . Days of Exercise per Week: Not on file  . Minutes of Exercise per Session: Not on file  Stress:   . Feeling of Stress : Not on file  Social Connections:   . Frequency of Communication with Friends and Family: Not on file  . Frequency of Social Gatherings with Friends and Family: Not on file  . Attends Religious Services: Not on file  . Active Member of Clubs or Organizations: Not on file  . Attends Archivist Meetings: Not on file  . Marital Status: Not on file   Outpatient Encounter Medications as of 09/22/2019  Medication Sig  . acetaminophen (TYLENOL) 500 MG tablet Take 1,000 mg by mouth every 8 (eight) hours as needed for mild pain.  Marland Kitchen amLODipine (NORVASC) 5 MG tablet Take 5 mg by mouth daily.  . Calcium Carb-Cholecalciferol (CALCIUM 500+D3 PO) Take 1 tablet by mouth 3 (three) times daily.   . calcium carbonate (TUMS - DOSED IN MG ELEMENTAL CALCIUM) 500 MG chewable tablet Chew 1-2 tablets by mouth 3 (three) times daily as needed for indigestion or heartburn.  . escitalopram (LEXAPRO) 20 MG tablet Take 20 mg by mouth daily.  Marland Kitchen gabapentin (NEURONTIN) 300 MG capsule Take 300 mg by mouth at bedtime.   Marland Kitchen HYDROcodone-acetaminophen (NORCO) 10-325 MG tablet Take 1-2 tablets by mouth every 6 (six) hours as needed for moderate pain.  Marland Kitchen HYDROcodone-acetaminophen  (NORCO/VICODIN) 5-325 MG tablet Take 1-2 tablets by mouth every 4 (four) hours as needed for moderate pain ((score 7 to 10)).  Marland Kitchen levocetirizine (XYZAL) 5 MG tablet Take 5 mg by mouth daily.   . Magnesium 400 MG CAPS Take 400 mg by mouth daily.   . methocarbamol (ROBAXIN) 750 MG tablet Take 1 tablet (750 mg total) by mouth every 8 (eight) hours as needed for muscle spasms.  . metoCLOPramide (REGLAN) 5 MG tablet Take 5 mg by mouth 3 (three) times daily.  . Multiple Vitamin (MULTIVITAMIN WITH MINERALS) TABS tablet Take 1 tablet by mouth daily.  Marland Kitchen olmesartan (BENICAR) 40 MG tablet Take 40 mg by mouth daily.  . Omalizumab (XOLAIR [place]) Inject 1 Dose into the skin every 14 (fourteen) days.  Marland Kitchen omeprazole (  PRILOSEC) 20 MG capsule Take 20 mg by mouth every evening.   . ondansetron (ZOFRAN) 4 MG tablet Take 1 tablet (4 mg total) by mouth every 8 (eight) hours as needed. (Patient taking differently: Take 4 mg by mouth every 8 (eight) hours as needed for nausea or vomiting. )  . [DISCONTINUED] amLODipine (NORVASC) 10 MG tablet Take 10 mg by mouth every evening.   . [DISCONTINUED] Cyanocobalamin (VITAMIN B-12) 3000 MCG SUBL Take 3,000 mcg by mouth daily.  . [DISCONTINUED] metFORMIN (GLUCOPHAGE) 500 MG tablet Take 1 tablet (500 mg total) by mouth daily with breakfast.  . [DISCONTINUED] olmesartan-hydrochlorothiazide (BENICAR HCT) 40-12.5 MG tablet Take 1 tablet by mouth daily.  . [DISCONTINUED] Semaglutide (OZEMPIC, 0.25 OR 0.5 MG/DOSE, [place]) Inject 0.5 mg into the skin every Sunday.    No facility-administered encounter medications on file as of 09/22/2019.   ALLERGIES: No Known Allergies VACCINATION STATUS: Immunization History  Administered Date(s) Administered  . Influenza-Unspecified 10/22/2017    Diabetes He presents for his follow-up diabetic visit. He has type 2 diabetes mellitus. Onset time: He was diagnosed at approximate age of 31 years. His disease course has been improving. There are no  hypoglycemic associated symptoms. Pertinent negatives for hypoglycemia include no confusion, headaches, pallor or seizures. Pertinent negatives for diabetes include no chest pain, no polydipsia, no polyphagia, no polyuria and no weakness. There are no hypoglycemic complications. Symptoms are improving. Diabetic complications include nephropathy. Risk factors for coronary artery disease include diabetes mellitus, dyslipidemia, hypertension and male sex. His weight is decreasing steadily (He is status post Roux-en-Y gastric bypass- he has lost approx 115 kbs overall.). He is following a diabetic diet. When asked about meal planning, he reported none. He has not had a previous visit with a dietitian. He participates in exercise intermittently (He participates in golfing activities.). His home blood glucose trend is decreasing steadily. (His point-of-care A1c today is 5.7%, improving from 7.1% during his last visit.  He denies any hypoglycemia.) An ACE inhibitor/angiotensin II receptor blocker is being taken. Eye exam is current.  Hyperlipidemia This is a chronic problem. The current episode started more than 1 year ago. The problem is uncontrolled. Exacerbating diseases include chronic renal disease and diabetes. He has no history of obesity. Pertinent negatives include no chest pain, myalgias or shortness of breath. Current antihyperlipidemic treatment includes statins and bile acid squestrants. Compliance problems include adherence to diet.  Risk factors for coronary artery disease include diabetes mellitus, dyslipidemia, hypertension, male sex, obesity and a sedentary lifestyle.  Hypertension This is a chronic problem. The current episode started more than 1 year ago. The problem is uncontrolled. Pertinent negatives include no chest pain, headaches, neck pain, palpitations or shortness of breath. Risk factors for coronary artery disease include diabetes mellitus, dyslipidemia and sedentary lifestyle. Past  treatments include angiotensin blockers. Hypertensive end-organ damage includes kidney disease. Identifiable causes of hypertension include chronic renal disease.     Review of Systems  Constitutional: Negative for chills, fever and unexpected weight change.  HENT: Negative for dental problem, mouth sores and trouble swallowing.   Eyes: Negative for visual disturbance.  Respiratory: Negative for cough, choking, chest tightness, shortness of breath and wheezing.   Cardiovascular: Negative for chest pain, palpitations and leg swelling.  Gastrointestinal: Negative for abdominal distention, abdominal pain, constipation, diarrhea, nausea and vomiting.  Endocrine: Negative for polydipsia, polyphagia and polyuria.  Genitourinary: Negative for dysuria, flank pain, hematuria and urgency.  Musculoskeletal: Negative for back pain, gait problem, myalgias and neck pain.  Skin: Negative for pallor, rash and wound.  Neurological: Negative for seizures, syncope, weakness, numbness and headaches.  Psychiatric/Behavioral: Negative for confusion and dysphoric mood.    Objective:    BP 108/74   Pulse 68   Ht 5' 11"  (1.803 m)   Wt 167 lb (75.8 kg)   BMI 23.29 kg/m   Wt Readings from Last 3 Encounters:  09/22/19 167 lb (75.8 kg)  06/08/19 180 lb (81.6 kg)  06/05/19 183 lb 8 oz (83.2 kg)    Physical Exam Constitutional:      General: He is not in acute distress.    Appearance: He is well-developed.  HENT:     Head: Normocephalic and atraumatic.  Neck:     Thyroid: No thyromegaly.     Trachea: No tracheal deviation.  Cardiovascular:     Rate and Rhythm: Normal rate.     Pulses:          Dorsalis pedis pulses are 1+ on the right side and 1+ on the left side.       Posterior tibial pulses are 1+ on the right side and 1+ on the left side.     Heart sounds: S1 normal and S2 normal. No murmur heard.  No gallop.   Pulmonary:     Effort: Pulmonary effort is normal. No respiratory distress.      Breath sounds: No wheezing.  Abdominal:     General: There is no distension.     Tenderness: There is no abdominal tenderness. There is no guarding.  Musculoskeletal:     Right shoulder: No swelling or deformity.     Cervical back: Normal range of motion and neck supple.  Skin:    General: Skin is warm and dry.     Findings: No rash.     Nails: There is no clubbing.  Neurological:     Mental Status: He is alert and oriented to person, place, and time.     Cranial Nerves: No cranial nerve deficit.     Sensory: No sensory deficit.     Gait: Gait normal.     Deep Tendon Reflexes: Reflexes are normal and symmetric.  Psychiatric:        Speech: Speech normal.        Behavior: Behavior normal. Behavior is cooperative.        Thought Content: Thought content normal.        Judgment: Judgment normal.    Recent Results (from the past 2160 hour(s))  VITAMIN D 25 Hydroxy (Vit-D Deficiency, Fractures)     Status: None   Collection Time: 09/11/19 12:00 AM  Result Value Ref Range   Vit D, 25-Hydroxy 69.6   Basic metabolic panel     Status: Abnormal   Collection Time: 09/11/19 12:00 AM  Result Value Ref Range   BUN 21 4 - 21   Creatinine 1.6 (A) 0.6 - 1.3   Potassium 4.0 3.4 - 5.3  Comprehensive metabolic panel     Status: None   Collection Time: 09/11/19 12:00 AM  Result Value Ref Range   GFR calc Af Amer 50    GFR calc non Af Amer 43    Calcium 9.6 8.7 - 10.7  Comprehensive metabolic panel     Status: Abnormal   Collection Time: 09/11/19  9:57 AM  Result Value Ref Range   Glucose 127 (H) 65 - 99 mg/dL   BUN 21 8 - 27 mg/dL   Creatinine, Ser 1.60 (H) 0.76 - 1.27  mg/dL   GFR calc non Af Amer 43 (L) >59 mL/min/1.73   GFR calc Af Amer 50 (L) >59 mL/min/1.73    Comment: **[facility] currently reports eGFR in compliance with the current**   recommendations of the [employer]. [facility] will   update reporting as new guidelines are published from the NKF-ASN   Task force.     BUN/Creatinine Ratio 13 10 - 24   Sodium 142 134 - 144 mmol/L   Potassium 4.0 3.5 - 5.2 mmol/L   Chloride 103 96 - 106 mmol/L   CO2 24 20 - 29 mmol/L   Calcium 9.6 8.6 - 10.2 mg/dL   Total Protein 7.0 6.0 - 8.5 g/dL   Albumin 4.5 3.8 - 4.8 g/dL   Globulin, Total 2.5 1.5 - 4.5 g/dL   Albumin/Globulin Ratio 1.8 1.2 - 2.2   Bilirubin Total 1.3 (H) 0.0 - 1.2 mg/dL   Alkaline Phosphatase 69 48 - 121 IU/L   AST 18 0 - 40 IU/L   ALT 16 0 - 44 IU/L  VITAMIN D 25 Hydroxy (Vit-D Deficiency, Fractures)     Status: None   Collection Time: 09/11/19  9:57 AM  Result Value Ref Range   Vit D, 25-Hydroxy 43.7 30.0 - 100.0 ng/mL    Comment: Vitamin D deficiency has been defined by the Institute of Medicine and an Endocrine Society practice guideline as a level of serum 25-OH vitamin D less than 20 ng/mL (1,2). The Endocrine Society went on to further define vitamin D insufficiency as a level between 21 and 29 ng/mL (2). 1. IOM (Institute of Medicine). 2010. Dietary reference    intakes for calcium and D. [place]: The    [employer]. 2. Holick MF, [place], Bischoff-Ferrari HA, et al.    Evaluation, treatment, and prevention of vitamin D    deficiency: an Endocrine Society clinical practice    guideline. JCEM. 2011 Jul; 96(7):1911-30.   Specimen status report     Status: None   Collection Time: 09/11/19  9:57 AM  Result Value Ref Range   specimen status report Comment     Comment: Isac Caddy CMP14 Default Isac Caddy CMP14 Default A hand-written panel/profile was received from your office. In accordance with the [facility] Ambiguous Test Code Policy dated July 4782, we have completed your order by using the closest currently or formerly recognized AMA panel.  We have assigned Comprehensive Metabolic Panel (14), Test Code #322000 to this request.  If this is not the testing you wished to receive on this specimen, please contact the [facility] Client Inquiry/Technical  Services Department to clarify the test order.  We appreciate your business.   HgB A1c     Status: Abnormal   Collection Time: 09/22/19  9:08 AM  Result Value Ref Range   Hemoglobin A1C 5.7 (A) 4.0 - 5.6 %   HbA1c POC (<> result, manual entry)     HbA1c, POC (prediabetic range)     HbA1c, POC (controlled diabetic range)     Lipid Panel     Component Value Date/Time   CHOL 163 07/21/2018 0000   TRIG 111 07/21/2018 0000   HDL 53 07/21/2018 0000   CHOLHDL 3.9 04/25/2016 1119   VLDL 27 04/25/2016 1119   LDLCALC 89 07/21/2018 0000     Assessment & Plan:   1.  type 2 diabetes mellitus  - Patient has currently controlled asymptomatic type 2 DM since  69 years of age. -Status post Roux-en-Y gastric bypass,  lost approximately 100 pounds due to his surgery.    His point-of-care A1c today is 5.7%, improving from 7.1% during his last visit.  He denies any hypoglycemia. - His diabetes is complicated by mild  CKD  and patient remains at a high risk for more acute and chronic complications of diabetes which include CAD, CVA, CKD, retinopathy, and neuropathy. These are all discussed in detail with the patient.  - I have counseled the patient on diet management and weight loss, by adopting a carbohydrate restricted/protein rich diet.   -  Suggestion is made for him to avoid simple carbohydrates  from his diet including Cakes, Sweet Desserts / Pastries, Ice Cream, Soda (diet and regular), Sweet Tea, Candies, Chips, Cookies, Sweet Pastries,  Store Bought Juices, Alcohol in Excess of  1-2 drinks a day, Artificial Sweeteners, Coffee Creamer, and "Sugar-free" Products. This will help patient to have stable blood glucose profile and potentially avoid unintended weight gain.   - I encouraged the patient to switch to  unprocessed or minimally processed complex starch and increased protein intake (animal or plant source), fruits, and vegetables.  - Patient is advised to stick to a routine mealtimes to  eat 3 meals  a day and avoid unnecessary snacks ( to snack only to correct hypoglycemia).   - I have approached patient with the following individualized plan to manage diabetes and patient agrees:   -In light of his overall weight loss, and time A1c of 5.7%, he is advised to discontinue both Metformin and Ozempic at this time.    - Patient specific target  A1c;  LDL, HDL, Triglycerides, and  Waist Circumference were discussed in detail.  2) BP/HTN: His blood pressure is controlled to target. He is advised to continue on olmesartan/hydrochlorothiazide .  3) Lipids/HPL: Recent lipid panel showed improved LDL at 89.  He is advised to continue atorvastatin 20 mg p.o. nightly.       4)  Weight/Diet: Status post Roux-en-Y gastric bypass, lost more than 115 pounds overall, lately he is losing unintentionally due to loss of appetite.  He is encouraged to eat small frequent meals.  He is not a candidate for any further weight loss.  CDE Consult in progress , exercise, and detailed carbohydrates information provided.  5) Chronic Care/Health Maintenance:  -Patient is on ACEI/ARB and Statin medications and encouraged to continue to follow up with Ophthalmology, Podiatrist at least yearly or according to recommendations, and advised to   stay away from smoking. I have recommended yearly flu vaccine and pneumonia vaccination at least every 5 years; moderate intensity exercise for up to 150 minutes weekly; and  sleep for at least 7 hours a day. -He is advised to get his colonoscopy through GI.  His constipation is likely due to his pain medications.   - I advised patient to maintain close follow up with Vasireddy, Lanetta Inch, MD for primary care needs.  - Time spent on this patient care encounter:  35 min, of which > 50% was spent in  counseling and the rest reviewing his blood glucose logs , discussing his hypoglycemia and hyperglycemia episodes, reviewing his current and  previous labs / studies  (  including abstraction from other facilities) and medications  doses and developing a  long term treatment plan and documenting his care.   Please refer to Patient Instructions for Blood Glucose Monitoring and Insulin/Medications Dosing Guide"  in media tab for additional information. Please  also refer to " Patient Self Inventory" in  the Media  tab for reviewed elements of pertinent patient history.  [place] participated in the discussions, expressed understanding, and voiced agreement with the above plans.  All questions were answered to his satisfaction. he is encouraged to contact clinic should he have any questions or concerns prior to his return visit.     Follow up plan: In 6 months with labs. Glade Lloyd, MD Phone: [phone]  Fax: [phone]  -  This note was partially dictated with voice recognition software. Similar sounding words can be transcribed inadequately or may not  be corrected upon review.  09/22/2019, 12:01 PM

## 2019-11-11 ENCOUNTER — Ambulatory Visit (INDEPENDENT_AMBULATORY_CARE_PROVIDER_SITE_OTHER): Payer: Medicare Other | Admitting: Internal Medicine

## 2019-11-11 ENCOUNTER — Other Ambulatory Visit: Payer: Self-pay

## 2019-11-11 ENCOUNTER — Encounter: Payer: Self-pay | Admitting: Internal Medicine

## 2019-11-11 DIAGNOSIS — J4540 Moderate persistent asthma, uncomplicated: Secondary | ICD-10-CM | POA: Diagnosis not present

## 2019-11-11 MED ORDER — ALBUTEROL SULFATE HFA 108 (90 BASE) MCG/ACT IN AERS: INHALATION_SPRAY | RESPIRATORY_TRACT | 1 refills | Status: AC

## 2019-11-11 NOTE — Patient Instructions (Addendum)
Continue your medications including xolair.   If you additional support for Xolair that we can't provide thru this office, I recommend you see Dr Ernst Bowler with [facility] in [place] office.  Only use your albuterol as a rescue medication to be used if you can't catch your breath by resting or doing a relaxed purse lip breathing pattern.  - The less you use it, the better it will work when you need it. - Ok to use up to 2 puffs  every 4 hours if you must but call for immediate appointment if use goes up over your usual need - Don't leave home without it !!  (think of it like the spare tire for your car)    Please schedule a follow up visit in 12 months but call sooner if needed

## 2019-11-11 NOTE — Assessment & Plan Note (Signed)
Never smoker with onset allergy/sinus dz in childhood and asthma around age 69 - Xolair rx per Luan Pulling started around 2010 marked improvement in asthma > sinus dz on no maint rx as of 1st [place] pulmonary eval 11/11/2019   Since he has ongoing sinus issues on xolair it may well be he would flare quickly with more asthma if xolair were stopped but hard to predict.  I would favor continuing present rx but if insurance pushes back then re-eval per Allergy/ Ernst Bowler with options for other biologics that may be easier to get approved.   In meantime advised he still has asthma and should carry rescue at all times and remember the rule of 2s  If your breathing worsens or you need to use your rescue inhaler more than twice weekly or wake up more than twice a month with any respiratory symptoms or require more than two rescue inhalers per year, we need to see you right away because this means we're not controlling the underlying problem (inflammation) adequately.  Rescue inhalers (albuterol) do not control inflammation and overuse can lead to unnecessary and costly consequences.  They can make you feel better temporarily but eventually they will quit working effectively much as sleep aids lead to more insomnia if used regularly.            Each maintenance medication was reviewed in detail including emphasizing most importantly the difference between maintenance and prns and under what circumstances the prns are to be triggered using an action plan format where appropriate.  Total time for H and P, chart review, counseling, teaching device and generating customized AVS unique to this office visit / charting = 50 min

## 2019-11-11 NOTE — Progress Notes (Signed)
Allen [name], male    DOB: Aug 12, 1950,    MRN: [id number]   Brief patient profile:  69 yowm former pt of Dr Luan Pulling never smoker allergies/sinus problems all his life and then asthma around the age of 56 and difficult to control on multiple maint rx  but big change w/in the first year started xolair ? Around 2010 but still bothered by freq ear /sinus infections f/b Dr Herbie Saxon but rarely if ever using any saba and no maint rx at all so self referred back to [facility] clinic to renew xolair    S/p gastric bypass in 2018 lost almost 100 lbs made a bid difference no longer DM    History of Present Illness  11/11/2019  Pulmonary/ 1st office eval/ Allen [name] / [facility] Office  Chief Complaint  Patient presents with  . Pulmonary Consult    Former patient of Dr Luan Pulling. He states his breathing is doing well today.  He is requesting [name] to sign form in order for him to continue Xolair at infusion clinic in [place].   Dyspnea:  Not limited by breathing from desired activities  / regular walking including including hills Cough: minimal assocs pnds  Sleep: 30 degrees electric bed  SABA use: rarely  No obvious day to day or daytime variability or assoc excess/ purulent sputum or mucus plugs or hemoptysis or cp or chest tightness, subjective wheeze or overt sinus or hb symptoms.   Sleeping as above without nocturnal  or early am exacerbation  of respiratory  c/o's or need for noct saba. Also denies any obvious fluctuation of symptoms with weather or environmental changes or other aggravating or alleviating factors except as outlined above   No unusual exposure hx or h/o childhood pna/ asthma or knowledge of premature birth.  Current Allergies, Complete Past Medical History, Past Surgical History, Family History, and Social History were reviewed in [employer] record.  ROS  The following are not active complaints unless bolded Hoarseness, sore throat,  dysphagia, dental problems, itching, sneezing,  nasal congestion or discharge of excess mucus or purulent secretions, ear ache,   fever, chills, sweats, unintended wt loss or wt gain, classically pleuritic or exertional cp,  orthopnea pnd or arm/hand swelling  or leg swelling, presyncope, palpitations, abdominal pain, anorexia, nausea, vomiting, diarrhea  or change in bowel habits or change in bladder habits, change in stools or change in urine, dysuria, hematuria,  rash, arthralgias, visual complaints, headache, numbness, weakness or ataxia or problems with walking or coordination,  change in mood or  memory.           Past Medical History:  Diagnosis Date  . Anemia    on iron since gastric bypass  . Arthritis    mild  . Asthma   . Chronic kidney disease    stage 3, now resolved since weight loss  . Claustrophobia   . Diabetes mellitus, type II ([place])    type 2 metformin  . GERD (gastroesophageal reflux disease)    takes nexium  . Hyperlipidemia   . Hypertension   . Pneumonia 15 yrs ago  . SCC (squamous cell carcinoma) 12/29/2007   Left Mid Preauricular(Mod.Diff) (Dr. Lacinda Axon)  . SCC (squamous cell carcinoma) 12/29/2007   Right Upper Dorsal Nose(Well Diff) (Dr. Lacinda Axon)  . SCC (squamous cell carcinoma) 12/29/2007   Anti-Tragus Right Ear(Mod.Diff) (Dr. Lacinda Axon)  . SCC (squamous cell carcinoma) 06/06/2010   Left Nose(in Situ) (curet and 5FU)  . SCC (squamous cell  carcinoma) 06/06/2010   Right Check(Well Diff) (curet and 5FU)  . SCC (squamous cell carcinoma) 07/20/2013   Right Sideburn(KA) (curet and 5FU)  . SCC (squamous cell carcinoma) 06/27/2015   Right Neck Sup(in Situ) (curet and 5FU)  . SCC (squamous cell carcinoma) 06/27/2015   Right Neck Inf(in Situ) (curet and 5FU)  . SCC (squamous cell carcinoma) 01/02/2017   Left Wrist(in Situ) (curet and 5FU)  . SCC (squamous cell carcinoma) 01/02/2017   Right Outer Cheek(in Situ) (curet and 5FU)  . SCC (squamous cell carcinoma) 06/23/2018    Left Sideburn(in Situ) (curet and 5FU)  . SCC (squamous cell carcinoma) 06/23/2018   Left Upper Lip(Well Diff) (MOH's)  . SCC (squamous cell carcinoma) 06/23/2018   Right Ear Rim(in Situ) (curet and 5FU)  . Sleep apnea    cpap, no cpap since may 2018 weight loss 80 labs   . Superficial nodular basal cell carcinoma (BCC) 06/27/2015   Left Rim of Ear (curet and 5FU)    Outpatient Medications Prior to Visit  Medication Sig Dispense Refill  . acetaminophen (TYLENOL) 500 MG tablet Take 1,000 mg by mouth every 8 (eight) hours as needed for mild pain.    Marland Kitchen amLODipine (NORVASC) 5 MG tablet Take 5 mg by mouth daily.    . Calcium Carb-Cholecalciferol (CALCIUM 500+D3 PO) Take 1 tablet by mouth 3 (three) times daily.     . calcium carbonate (TUMS - DOSED IN MG ELEMENTAL CALCIUM) 500 MG chewable tablet Chew 1-2 tablets by mouth 3 (three) times daily as needed for indigestion or heartburn.    . escitalopram (LEXAPRO) 20 MG tablet Take 20 mg by mouth daily.    Marland Kitchen gabapentin (NEURONTIN) 300 MG capsule Take 300 mg by mouth at bedtime.     . Magnesium 400 MG CAPS Take 400 mg by mouth daily.     . metoCLOPramide (REGLAN) 5 MG tablet Take 5 mg by mouth 3 (three) times daily.    . Multiple Vitamin (MULTIVITAMIN WITH MINERALS) TABS tablet Take 1 tablet by mouth daily.    Marland Kitchen olmesartan (BENICAR) 40 MG tablet Take 40 mg by mouth daily.    . Omalizumab (XOLAIR [place]) Inject 1 Dose into the skin every 14 (fourteen) days.    Marland Kitchen omeprazole (PRILOSEC) 20 MG capsule Take 20 mg by mouth every evening.     Marland Kitchen HYDROcodone-acetaminophen (NORCO) 10-325 MG tablet Take 1-2 tablets by mouth every 6 (six) hours as needed for moderate pain. 30 tablet 0  . HYDROcodone-acetaminophen (NORCO/VICODIN) 5-325 MG tablet Take 1-2 tablets by mouth every 4 (four) hours as needed for moderate pain ((score 7 to 10)). 30 tablet 0  . levocetirizine (XYZAL) 5 MG tablet Take 5 mg by mouth daily.     . methocarbamol (ROBAXIN) 750 MG tablet Take 1  tablet (750 mg total) by mouth every 8 (eight) hours as needed for muscle spasms. 60 tablet 1  . ondansetron (ZOFRAN) 4 MG tablet Take 1 tablet (4 mg total) by mouth every 8 (eight) hours as needed. (Patient taking differently: Take 4 mg by mouth every 8 (eight) hours as needed for nausea or vomiting. ) 20 tablet 3   No facility-administered medications prior to visit.      Objective:     BP (!) 142/88 (BP Location: Left Arm, Cuff Size: Normal)   Pulse 63   Temp (!) 97.1 F (36.2 C) (Temporal)   Ht 5\' 11"  (1.803 m)   Wt 177 lb (80.3 kg)   SpO2 95%  Comment: on RA  BMI 24.69 kg/m   SpO2: 95 % (on RA)   amb pleasant wm nad    HEENT : pt wearing mask not removed for exam due to covid -19 concerns.    NECK :  without JVD/Nodes/TM/ nl carotid upstrokes bilaterally   LUNGS: no acc muscle use,  Nl contour chest which is clear to A and P bilaterally without cough on insp or exp maneuvers   CV:  RRR  no s3 or murmur or increase in P2, and no edema   ABD:  soft and nontender with nl inspiratory excursion in the supine position. No bruits or organomegaly appreciated, bowel sounds nl  MS:  Nl gait/ ext warm without deformities, calf tenderness, cyanosis or clubbing No obvious joint restrictions   SKIN: warm and dry without lesions    NEURO:  alert, approp, nl sensorium with  no motor or cerebellar deficits apparent.       Assessment   Chronic asthma, moderate persistent, uncomplicated Never smoker with onset allergy/sinus dz in childhood and asthma around age 26 - Xolair rx per Luan Pulling started around 2010 marked improvement in asthma > sinus dz on no maint rx as of 1st [place] pulmonary eval 11/11/2019   Since he has ongoing sinus issues on xolair it may well be he would flare quickly with more asthma if xolair were stopped but hard to predict.  I would favor continuing present rx but if insurance pushes back then re-eval per Allergy/ Ernst Bowler with options for other biologics  that may be easier to get approved.   In meantime advised he still has asthma and should carry rescue at all times and remember the rule of 2s  If your breathing worsens or you need to use your rescue inhaler more than twice weekly or wake up more than twice a month with any respiratory symptoms or require more than two rescue inhalers per year, we need to see you right away because this means we're not controlling the underlying problem (inflammation) adequately.  Rescue inhalers (albuterol) do not control inflammation and overuse can lead to unnecessary and costly consequences.  They can make you feel better temporarily but eventually they will quit working effectively much as sleep aids lead to more insomnia if used regularly.            Each maintenance medication was reviewed in detail including emphasizing most importantly the difference between maintenance and prns and under what circumstances the prns are to be triggered using an action plan format where appropriate.  Total time for H and P, chart review, counseling, teaching device and generating customized AVS unique to this office visit / charting = 50 min           Christinia Gully, MD 11/11/2019

## 2020-03-16 LAB — LIPID PANEL
Cholesterol: 197 (ref 0–200)
HDL: 48 (ref 35–70)
LDL Cholesterol: 123
Triglycerides: 150 (ref 40–160)

## 2020-03-16 LAB — COMPREHENSIVE METABOLIC PANEL
Calcium: 9.2 (ref 8.7–10.7)
GFR calc Af Amer: 46
GFR calc non Af Amer: 40

## 2020-03-16 LAB — BASIC METABOLIC PANEL
BUN: 31 — AB (ref 4–21)
Creatinine: 1.7 — AB (ref 0.6–1.3)

## 2020-03-23 ENCOUNTER — Encounter: Payer: Self-pay | Admitting: "Endocrinology

## 2020-03-23 ENCOUNTER — Ambulatory Visit (INDEPENDENT_AMBULATORY_CARE_PROVIDER_SITE_OTHER): Payer: Medicare Other | Admitting: "Endocrinology

## 2020-03-23 ENCOUNTER — Other Ambulatory Visit: Payer: Self-pay

## 2020-03-23 VITALS — BP 140/85 | HR 69 | Ht 71.0 in | Wt 185.4 lb

## 2020-03-23 DIAGNOSIS — E1165 Type 2 diabetes mellitus with hyperglycemia: Secondary | ICD-10-CM | POA: Diagnosis not present

## 2020-03-23 DIAGNOSIS — E782 Mixed hyperlipidemia: Secondary | ICD-10-CM

## 2020-03-23 DIAGNOSIS — I10 Essential (primary) hypertension: Secondary | ICD-10-CM

## 2020-03-23 LAB — POCT GLYCOSYLATED HEMOGLOBIN (HGB A1C): HbA1c, POC (controlled diabetic range): 8.2 % — AB (ref 0.0–7.0)

## 2020-03-23 MED ORDER — TRULICITY 1.5 MG/0.5ML ~~LOC~~ SOAJ: 1.5000 mg | SUBCUTANEOUS | 2 refills | Status: DC

## 2020-03-23 NOTE — Progress Notes (Signed)
03/23/2020   Endocrinology follow-up note   Subjective:    Patient ID: Allen [name], male    DOB: [birth year]. Patient is being seen in follow-up for the management of type 2 diabetes.    Vasireddy, Lanetta Inch, MD  Past Medical History:  Diagnosis Date  . Anemia    on iron since gastric bypass  . Arthritis    mild  . Asthma   . Chronic kidney disease    stage 3, now resolved since weight loss  . Claustrophobia   . Diabetes mellitus, type II ([place])    type 2 metformin  . GERD (gastroesophageal reflux disease)    takes nexium  . Hyperlipidemia   . Hypertension   . Pneumonia 15 yrs ago  . SCC (squamous cell carcinoma) 12/29/2007   Left Mid Preauricular(Mod.Diff) (Dr. Lacinda Axon)  . SCC (squamous cell carcinoma) 12/29/2007   Right Upper Dorsal Nose(Well Diff) (Dr. Lacinda Axon)  . SCC (squamous cell carcinoma) 12/29/2007   Anti-Tragus Right Ear(Mod.Diff) (Dr. Lacinda Axon)  . SCC (squamous cell carcinoma) 06/06/2010   Left Nose(in Situ) (curet and 5FU)  . SCC (squamous cell carcinoma) 06/06/2010   Right Check(Well Diff) (curet and 5FU)  . SCC (squamous cell carcinoma) 07/20/2013   Right Sideburn(KA) (curet and 5FU)  . SCC (squamous cell carcinoma) 06/27/2015   Right Neck Sup(in Situ) (curet and 5FU)  . SCC (squamous cell carcinoma) 06/27/2015   Right Neck Inf(in Situ) (curet and 5FU)  . SCC (squamous cell carcinoma) 01/02/2017   Left Wrist(in Situ) (curet and 5FU)  . SCC (squamous cell carcinoma) 01/02/2017   Right Outer Cheek(in Situ) (curet and 5FU)  . SCC (squamous cell carcinoma) 06/23/2018   Left Sideburn(in Situ) (curet and 5FU)  . SCC (squamous cell carcinoma) 06/23/2018   Left Upper Lip(Well Diff) (MOH's)  . SCC (squamous cell carcinoma) 06/23/2018   Right Ear Rim(in Situ) (curet and 5FU)  . Sleep apnea    cpap, no cpap since may 2018 weight loss 80 labs   . Superficial nodular basal cell carcinoma (BCC) 06/27/2015   Left Rim of Ear (curet and 5FU)   Past Surgical  History:  Procedure Laterality Date  . ANTERIOR CERVICAL DECOMP/DISCECTOMY FUSION Right 10/10/2018   Procedure: Right sided revision with hardware removal at Cervical three-four, Cervical four-five Anterior cervical decompression/discectomy/fusion with corpectomy at Cervical five;  Surgeon: Erline Levine, MD;  Location: [facility];  Service: Neurosurgery;  Laterality: Right;  . CARDIAC CATHETERIZATION     patient reports he thinks he had one over 25 years ago  . carpal tunnel Bilateral   . CERVICAL VERTEBRAE EXCISION     and cleaning  . CHOLECYSTECTOMY    . cyst removed left hand  01-28-17  . ESOPHAGOGASTRODUODENOSCOPY (EGD) WITH PROPOFOL N/A 01/31/2017   Procedure: ESOPHAGOGASTRODUODENOSCOPY (EGD) WITH PROPOFOL;  Surgeon: Alphonsa Overall, MD;  Location: [facility];  Service: General;  Laterality: N/A;  . EYE SURGERY     bilateral cataract removal  . GASTRIC ROUX-EN-Y N/A 06/04/2016   Procedure: LAPAROSCOPIC ROUX-EN-Y GASTRIC BYPASS WITH UPPER ENDOSCOPY;  Surgeon: Clovis Riley, MD;  Location: [facility];  Service: General;  Laterality: N/A;  . NASAL SINUS SURGERY    . POSTERIOR CERVICAL FUSION/FORAMINOTOMY N/A 06/08/2019   Procedure: Cervical Three to Cervical Seven Posterior cervical decompression and fusion;  Surgeon: Erline Levine, MD;  Location: [facility];  Service: Neurosurgery;  Laterality: N/A;  Cervical Three to Cervical Seven Posterior cervical decompression and fusion  . RADIOLOGY WITH ANESTHESIA N/A 04/28/2019  Procedure: MRI RADIOLOGY WITH ANESTHESIA;  Surgeon: Radiologist, Medication, MD;  Location: [facility];  Service: Radiology;  Laterality: N/A;  . TRIGGER FINGER RELEASE     Social History   Socioeconomic History  . Marital status: Married    Spouse name: Not on file  . Number of children: Not on file  . Years of education: Not on file  . Highest education level: Not on file  Occupational History  . Not on file  Tobacco Use  . Smoking status: Never Smoker  . Smokeless tobacco: Never  Used  Vaping Use  . Vaping Use: Never used  Substance and Sexual Activity  . Alcohol use: No  . Drug use: No  . Sexual activity: Yes  Other Topics Concern  . Not on file  Social History Narrative  . Not on file   Social Determinants of Health   Financial Resource Strain: Not on file  Food Insecurity: Not on file  Transportation Needs: Not on file  Physical Activity: Not on file  Stress: Not on file  Social Connections: Not on file   Outpatient Encounter Medications as of 03/23/2020  Medication Sig  . Dulaglutide (TRULICITY) 1.5 AS/5.0NL SOPN Inject 1.5 mg into the skin once a week.  Marland Kitchen acetaminophen (TYLENOL) 500 MG tablet Take 1,000 mg by mouth every 8 (eight) hours as needed for mild pain.  Marland Kitchen albuterol (PROAIR HFA) 108 (90 Base) MCG/ACT inhaler 2 puffs every 4 hours as needed only  if your can't catch your breath  . amLODipine (NORVASC) 5 MG tablet Take 5 mg by mouth daily.  . Calcium Carb-Cholecalciferol (CALCIUM 500+D3 PO) Take 1 tablet by mouth 3 (three) times daily.   . calcium carbonate (TUMS - DOSED IN MG ELEMENTAL CALCIUM) 500 MG chewable tablet Chew 1-2 tablets by mouth 3 (three) times daily as needed for indigestion or heartburn.  . escitalopram (LEXAPRO) 20 MG tablet Take 20 mg by mouth daily.  Marland Kitchen gabapentin (NEURONTIN) 300 MG capsule Take 300 mg by mouth at bedtime.   . Magnesium 400 MG CAPS Take 400 mg by mouth daily.   . metoCLOPramide (REGLAN) 5 MG tablet Take 5 mg by mouth 3 (three) times daily.  . Multiple Vitamin (MULTIVITAMIN WITH MINERALS) TABS tablet Take 1 tablet by mouth daily.  Marland Kitchen olmesartan-hydrochlorothiazide (BENICAR HCT) 40-12.5 MG tablet Take 1 tablet by mouth daily.  . Omalizumab (XOLAIR [place]) Inject 1 Dose into the skin every 14 (fourteen) days.  Marland Kitchen omeprazole (PRILOSEC) 20 MG capsule Take 20 mg by mouth every evening.   . [DISCONTINUED] olmesartan (BENICAR) 40 MG tablet Take 40 mg by mouth daily.   No facility-administered encounter medications on file  as of 03/23/2020.   ALLERGIES: No Known Allergies VACCINATION STATUS: Immunization History  Administered Date(s) Administered  . Influenza, High Dose Seasonal PF 10/19/2017, 10/29/2018  . Influenza-Unspecified 10/22/2017  . Tdap 12/23/2017  . Zoster Recombinat (Shingrix) 12/23/2017    Diabetes He presents for his follow-up diabetic visit. He has type 2 diabetes mellitus. Onset time: He was diagnosed at approximate age of 80 years. His disease course has been worsening. There are no hypoglycemic associated symptoms. Pertinent negatives for hypoglycemia include no confusion, headaches, pallor or seizures. Pertinent negatives for diabetes include no chest pain, no polydipsia, no polyphagia, no polyuria and no weakness. There are no hypoglycemic complications. Symptoms are worsening. Diabetic complications include nephropathy. Risk factors for coronary artery disease include diabetes mellitus, dyslipidemia, hypertension and male sex. His weight is decreasing steadily (He is status  post Roux-en-Y gastric bypass-after successfully losing 115 pounds, he has regained approximately 20 pounds since last visit.  ). He is following a diabetic diet. When asked about meal planning, he reported none. He has not had a previous visit with a dietitian. He participates in exercise intermittently (He participates in golfing activities.). (Since his last visit in August 2021, he has progressively gained some weight.  His point-of-care A1c was 8.2% today, increasing from 5.7% during his last visit.  He is not on any antidiabetic medication at this time.   ) An ACE inhibitor/angiotensin II receptor blocker is being taken. Eye exam is current.  Hyperlipidemia This is a chronic problem. The current episode started more than 1 year ago. The problem is uncontrolled. Exacerbating diseases include chronic renal disease and diabetes. He has no history of obesity. Pertinent negatives include no chest pain, myalgias or shortness of  breath. Current antihyperlipidemic treatment includes statins and bile acid squestrants. Compliance problems include adherence to diet.  Risk factors for coronary artery disease include diabetes mellitus, dyslipidemia, hypertension, male sex, obesity and a sedentary lifestyle.  Hypertension This is a chronic problem. The current episode started more than 1 year ago. The problem is uncontrolled. Pertinent negatives include no chest pain, headaches, neck pain, palpitations or shortness of breath. Risk factors for coronary artery disease include diabetes mellitus, dyslipidemia and sedentary lifestyle. Past treatments include angiotensin blockers. Hypertensive end-organ damage includes kidney disease. Identifiable causes of hypertension include chronic renal disease.     Review of Systems  Constitutional: Negative for chills, fever and unexpected weight change.  HENT: Negative for dental problem, mouth sores and trouble swallowing.   Eyes: Negative for visual disturbance.  Respiratory: Negative for cough, choking, chest tightness, shortness of breath and wheezing.   Cardiovascular: Negative for chest pain, palpitations and leg swelling.  Gastrointestinal: Negative for abdominal distention, abdominal pain, constipation, diarrhea, nausea and vomiting.  Endocrine: Negative for polydipsia, polyphagia and polyuria.  Genitourinary: Negative for dysuria, flank pain, hematuria and urgency.  Musculoskeletal: Negative for back pain, gait problem, myalgias and neck pain.  Skin: Negative for pallor, rash and wound.  Neurological: Negative for seizures, syncope, weakness, numbness and headaches.  Psychiatric/Behavioral: Negative for confusion and dysphoric mood.    Objective:    BP 140/85   Pulse 69   Ht 5\' 11"  (1.803 m)   Wt 185 lb 6.4 oz (84.1 kg)   BMI 25.86 kg/m   Wt Readings from Last 3 Encounters:  03/23/20 185 lb 6.4 oz (84.1 kg)  11/11/19 177 lb (80.3 kg)  09/22/19 167 lb (75.8 kg)     Physical Exam Constitutional:      General: He is not in acute distress.    Appearance: He is well-developed.  HENT:     Head: Normocephalic and atraumatic.  Neck:     Thyroid: No thyromegaly.     Trachea: No tracheal deviation.  Cardiovascular:     Rate and Rhythm: Normal rate.     Pulses:          Dorsalis pedis pulses are 1+ on the right side and 1+ on the left side.       Posterior tibial pulses are 1+ on the right side and 1+ on the left side.     Heart sounds: S1 normal and S2 normal. No murmur heard. No gallop.   Pulmonary:     Effort: Pulmonary effort is normal. No respiratory distress.     Breath sounds: No wheezing.  Abdominal:  General: There is no distension.     Tenderness: There is no abdominal tenderness. There is no guarding.  Musculoskeletal:     Right shoulder: No swelling or deformity.     Cervical back: Normal range of motion and neck supple.  Skin:    General: Skin is warm and dry.     Findings: No rash.     Nails: There is no clubbing.  Neurological:     Mental Status: He is alert and oriented to person, place, and time.     Cranial Nerves: No cranial nerve deficit.     Sensory: No sensory deficit.     Gait: Gait normal.     Deep Tendon Reflexes: Reflexes are normal and symmetric.  Psychiatric:        Speech: Speech normal.        Behavior: Behavior normal. Behavior is cooperative.        Thought Content: Thought content normal.        Judgment: Judgment normal.    Recent Results (from the past 2160 hour(s))  Basic metabolic panel     Status: Abnormal   Collection Time: 03/16/20 12:00 AM  Result Value Ref Range   BUN 31 (A) 4 - 21   Creatinine 1.7 (A) 0.6 - 1.3  Comprehensive metabolic panel     Status: None   Collection Time: 03/16/20 12:00 AM  Result Value Ref Range   GFR calc Af Amer 46    GFR calc non Af Amer 40    Calcium 9.2 8.7 - 10.7  Lipid panel     Status: None   Collection Time: 03/16/20 12:00 AM  Result Value Ref Range    Triglycerides 150 40 - 160   Cholesterol 197 0 - 200   HDL 48 35 - 70   LDL Cholesterol 123   HgB A1c     Status: Abnormal   Collection Time: 03/23/20 10:44 AM  Result Value Ref Range   Hemoglobin A1C     HbA1c POC (<> result, manual entry)     HbA1c, POC (prediabetic range)     HbA1c, POC (controlled diabetic range) 8.2 (A) 0.0 - 7.0 %   Lipid Panel     Component Value Date/Time   CHOL 197 03/16/2020 0000   TRIG 150 03/16/2020 0000   HDL 48 03/16/2020 0000   CHOLHDL 3.9 04/25/2016 1119   VLDL 27 04/25/2016 1119   [place] 123 03/16/2020 0000     Assessment & Plan:   1.  type 2 diabetes mellitus  - Patient has currently controlled asymptomatic type 2 DM since  70 years of age.  Patient is status post Roux-en-Y gastric bypass. Since his last visit in August 2021, he has progressively gained some weight.  His point-of-care A1c was 8.2% today, increasing from 5.7% during his last visit.  He is not on any antidiabetic medication at this time.    - His diabetes is complicated by mild  CKD  and patient remains at a high risk for more acute and chronic complications of diabetes which include CAD, CVA, CKD, retinopathy, and neuropathy. These are all discussed in detail with the patient.  - I have counseled the patient on diet management and weight loss, by adopting a carbohydrate restricted/protein rich diet.   - Suggestion is made for him to avoid simple carbohydrates  from his diet including Cakes, Sweet Desserts, Ice Cream, Soda (diet and regular), Sweet Tea, Candies, Chips, Cookies, Store Bought Juices, Alcohol in Excess  of  1-2 drinks a day, Artificial Sweeteners,  Coffee Creamer, and "Sugar-free" Products, Lemonade. This will help patient to have more stable blood glucose profile and potentially avoid unintended weight gain.  - I encouraged the patient to switch to  unprocessed or minimally processed complex starch and increased protein intake (animal or plant source), fruits, and  vegetables.  - Patient is advised to stick to a routine mealtimes to eat 3 meals  a day and avoid unnecessary snacks ( to snack only to correct hypoglycemia).   - I have approached patient with the following individualized plan to manage diabetes and patient agrees:   -He had exposure to steroids related to sinus infections in the interval.  His point-of-care A1c was 8.2% increasing from 5.7%.  He would benefit from reintroduction of some of his medications.  I discussed and initiated Trulicity 1.5 mg subcutaneously weekly.  He used GLP-1 receptor agonist in the past.  Side effects and precautions discussed with him.  2 sample pens for Trulicity were given to him from clinic.  - Patient specific target  A1c;  LDL, HDL, Triglycerides,  were discussed in detail.  2) BP/HTN: -His blood pressure is controlled to target. He is advised to continue on olmesartan/hydrochlorothiazide .  3) Lipids/HPL: Recent lipid panel showed increased LDL at 123 from 89.  He was approached for intervention with a statin, patient would like to discuss this with his PMD.       4)  Weight/Diet: Status post Roux-en-Y gastric bypass, successfully lost up to 115 pounds overall, lately he is regaining approximately 20 pounds since last visit.   He is encouraged to eat small frequent meals.  His current weight is optimal for him.  CDE Consult in progress , exercise, and detailed carbohydrates information provided.  5) Chronic Care/Health Maintenance:  -Patient is on ACEI/ARB and Statin medications and encouraged to continue to follow up with Ophthalmology, Podiatrist at least yearly or according to recommendations, and advised to   stay away from smoking. I have recommended yearly flu vaccine and pneumonia vaccination at least every 5 years; moderate intensity exercise for up to 150 minutes weekly; and  sleep for at least 7 hours a day. -He is advised to get his colonoscopy through GI.  His constipation is likely due to his  pain medications.  POCT ABI Results 03/23/20  Is ABIs normal today March 23, 2020. Right ABI: 1.22      left ABI: 1.29  Right leg systolic / diastolic: 284/13 mmHg Left leg systolic / diastolic: 244/010 mmHg  Arm systolic / diastolic: 272/53 mmHG .  Study will be repeated in March 2027, or sooner if needed.   - I advised patient to maintain close follow up with Vasireddy, Lanetta Inch, MD for primary care needs.  - Time spent on this patient care encounter:  35 min, of which > 50% was spent in  counseling and the rest reviewing his blood glucose logs , discussing his hypoglycemia and hyperglycemia episodes, reviewing his current and  previous labs / studies  ( including abstraction from other facilities) and medications  doses and developing a  long term treatment plan and documenting his care.   Please refer to Patient Instructions for Blood Glucose Monitoring and Insulin/Medications Dosing Guide"  in media tab for additional information. Please  also refer to " Patient Self Inventory" in the Media  tab for reviewed elements of pertinent patient history.  Bertram participated in the discussions, expressed understanding, and voiced  agreement with the above plans.  All questions were answered to his satisfaction. he is encouraged to contact clinic should he have any questions or concerns prior to his return visit.     Follow up plan: In 6 months with labs. Glade Lloyd, MD Phone: [phone]  Fax: [phone]  -  This note was partially dictated with voice recognition software. Similar sounding words can be transcribed inadequately or may not  be corrected upon review.  03/23/2020, 12:49 PM

## 2020-03-23 NOTE — Patient Instructions (Signed)

## 2020-06-09 ENCOUNTER — Other Ambulatory Visit: Payer: Self-pay | Admitting: "Endocrinology

## 2020-06-23 LAB — BASIC METABOLIC PANEL
BUN: 28 — AB (ref 4–21)
CO2: 23 — AB (ref 13–22)
Chloride: 107 (ref 99–108)
Creatinine: 2.3 — AB (ref 0.6–1.3)
Glucose: 141
Potassium: 4 (ref 3.4–5.3)
Sodium: 141 (ref 137–147)

## 2020-06-23 LAB — HEPATIC FUNCTION PANEL
ALT: 58 — AB (ref 10–40)
AST: 36 (ref 14–40)
Alkaline Phosphatase: 57 (ref 25–125)
Bilirubin, Total: 0.9

## 2020-06-23 LAB — COMPREHENSIVE METABOLIC PANEL
Albumin: 4.3 (ref 3.5–5.0)
Calcium: 9 (ref 8.7–10.7)
Globulin: 2

## 2020-06-29 ENCOUNTER — Encounter: Payer: Self-pay | Admitting: "Endocrinology

## 2020-06-29 ENCOUNTER — Ambulatory Visit (INDEPENDENT_AMBULATORY_CARE_PROVIDER_SITE_OTHER): Payer: Medicare Other | Admitting: "Endocrinology

## 2020-06-29 ENCOUNTER — Other Ambulatory Visit: Payer: Self-pay

## 2020-06-29 VITALS — BP 128/83 | HR 72 | Ht 71.0 in | Wt 177.6 lb

## 2020-06-29 DIAGNOSIS — E559 Vitamin D deficiency, unspecified: Secondary | ICD-10-CM | POA: Diagnosis not present

## 2020-06-29 DIAGNOSIS — E782 Mixed hyperlipidemia: Secondary | ICD-10-CM

## 2020-06-29 DIAGNOSIS — E1165 Type 2 diabetes mellitus with hyperglycemia: Secondary | ICD-10-CM

## 2020-06-29 DIAGNOSIS — I10 Essential (primary) hypertension: Secondary | ICD-10-CM | POA: Diagnosis not present

## 2020-06-29 LAB — POCT UA - MICROALBUMIN
Creatinine, POC: 300 mg/dL
Microalbumin Ur, POC: 80 mg/L

## 2020-06-29 LAB — POCT GLYCOSYLATED HEMOGLOBIN (HGB A1C): HbA1c, POC (controlled diabetic range): 6.5 % (ref 0.0–7.0)

## 2020-06-29 MED ORDER — TRULICITY 1.5 MG/0.5ML ~~LOC~~ SOAJ: 1.5000 mg | SUBCUTANEOUS | 1 refills | Status: DC

## 2020-06-29 NOTE — Patient Instructions (Signed)

## 2020-06-29 NOTE — Progress Notes (Signed)
06/29/2020   Endocrinology follow-up note   Subjective:    Patient ID: Allen [name], male    DOB: 1950/05/03. Patient is being seen in follow-up for the management of type 2 diabetes.    Vasireddy, Lanetta Inch, MD  Past Medical History:  Diagnosis Date  . Anemia    on iron since gastric bypass  . Arthritis    mild  . Asthma   . Chronic kidney disease    stage 3, now resolved since weight loss  . Claustrophobia   . Diabetes mellitus, type II ([place])    type 2 metformin  . GERD (gastroesophageal reflux disease)    takes nexium  . Hyperlipidemia   . Hypertension   . Pneumonia 15 yrs ago  . SCC (squamous cell carcinoma) 12/29/2007   Left Mid Preauricular(Mod.Diff) (Dr. Lacinda Axon)  . SCC (squamous cell carcinoma) 12/29/2007   Right Upper Dorsal Nose(Well Diff) (Dr. Lacinda Axon)  . SCC (squamous cell carcinoma) 12/29/2007   Anti-Tragus Right Ear(Mod.Diff) (Dr. Lacinda Axon)  . SCC (squamous cell carcinoma) 06/06/2010   Left Nose(in Situ) (curet and 5FU)  . SCC (squamous cell carcinoma) 06/06/2010   Right Check(Well Diff) (curet and 5FU)  . SCC (squamous cell carcinoma) 07/20/2013   Right Sideburn(KA) (curet and 5FU)  . SCC (squamous cell carcinoma) 06/27/2015   Right Neck Sup(in Situ) (curet and 5FU)  . SCC (squamous cell carcinoma) 06/27/2015   Right Neck Inf(in Situ) (curet and 5FU)  . SCC (squamous cell carcinoma) 01/02/2017   Left Wrist(in Situ) (curet and 5FU)  . SCC (squamous cell carcinoma) 01/02/2017   Right Outer Cheek(in Situ) (curet and 5FU)  . SCC (squamous cell carcinoma) 06/23/2018   Left Sideburn(in Situ) (curet and 5FU)  . SCC (squamous cell carcinoma) 06/23/2018   Left Upper Lip(Well Diff) (MOH's)  . SCC (squamous cell carcinoma) 06/23/2018   Right Ear Rim(in Situ) (curet and 5FU)  . Sleep apnea    cpap, no cpap since may 2018 weight loss 80 labs   . Superficial nodular basal cell carcinoma (BCC) 06/27/2015   Left Rim of Ear (curet and 5FU)   Past Surgical  History:  Procedure Laterality Date  . ANTERIOR CERVICAL DECOMP/DISCECTOMY FUSION Right 10/10/2018   Procedure: Right sided revision with hardware removal at Cervical three-four, Cervical four-five Anterior cervical decompression/discectomy/fusion with corpectomy at Cervical five;  Surgeon: Erline Levine, MD;  Location: [facility];  Service: Neurosurgery;  Laterality: Right;  . CARDIAC CATHETERIZATION     patient reports he thinks he had one over 25 years ago  . carpal tunnel Bilateral   . CERVICAL VERTEBRAE EXCISION     and cleaning  . CHOLECYSTECTOMY    . cyst removed left hand  01-28-17  . ESOPHAGOGASTRODUODENOSCOPY (EGD) WITH PROPOFOL N/A 01/31/2017   Procedure: ESOPHAGOGASTRODUODENOSCOPY (EGD) WITH PROPOFOL;  Surgeon: Alphonsa Overall, MD;  Location: [facility];  Service: General;  Laterality: N/A;  . EYE SURGERY     bilateral cataract removal  . GASTRIC ROUX-EN-Y N/A 06/04/2016   Procedure: LAPAROSCOPIC ROUX-EN-Y GASTRIC BYPASS WITH UPPER ENDOSCOPY;  Surgeon: Clovis Riley, MD;  Location: [facility];  Service: General;  Laterality: N/A;  . NASAL SINUS SURGERY    . POSTERIOR CERVICAL FUSION/FORAMINOTOMY N/A 06/08/2019   Procedure: Cervical Three to Cervical Seven Posterior cervical decompression and fusion;  Surgeon: Erline Levine, MD;  Location: [facility];  Service: Neurosurgery;  Laterality: N/A;  Cervical Three to Cervical Seven Posterior cervical decompression and fusion  . RADIOLOGY WITH ANESTHESIA N/A 04/28/2019  Procedure: MRI RADIOLOGY WITH ANESTHESIA;  Surgeon: Radiologist, Medication, MD;  Location: [facility];  Service: Radiology;  Laterality: N/A;  . TRIGGER FINGER RELEASE     Social History   Socioeconomic History  . Marital status: Married    Spouse name: Not on file  . Number of children: Not on file  . Years of education: Not on file  . Highest education level: Not on file  Occupational History  . Not on file  Tobacco Use  . Smoking status: Never Smoker  . Smokeless tobacco: Never  Used  Vaping Use  . Vaping Use: Never used  Substance and Sexual Activity  . Alcohol use: No  . Drug use: No  . Sexual activity: Yes  Other Topics Concern  . Not on file  Social History Narrative  . Not on file   Social Determinants of Health   Financial Resource Strain: Not on file  Food Insecurity: Not on file  Transportation Needs: Not on file  Physical Activity: Not on file  Stress: Not on file  Social Connections: Not on file   Outpatient Encounter Medications as of 06/29/2020  Medication Sig  . acetaminophen (TYLENOL) 500 MG tablet Take 1,000 mg by mouth every 8 (eight) hours as needed for mild pain.  Marland Kitchen albuterol (PROAIR HFA) 108 (90 Base) MCG/ACT inhaler 2 puffs every 4 hours as needed only  if your can't catch your breath  . amLODipine (NORVASC) 5 MG tablet Take 5 mg by mouth daily.  . Calcium Carb-Cholecalciferol (CALCIUM 500+D3 PO) Take 1 tablet by mouth 3 (three) times daily.   . calcium carbonate (TUMS - DOSED IN MG ELEMENTAL CALCIUM) 500 MG chewable tablet Chew 1-2 tablets by mouth 3 (three) times daily as needed for indigestion or heartburn.  . Dulaglutide (TRULICITY) 1.5 HC/6.2BJ SOPN Inject 1.5 mg into the skin once a week.  . escitalopram (LEXAPRO) 20 MG tablet Take 20 mg by mouth daily.  Marland Kitchen gabapentin (NEURONTIN) 300 MG capsule Take 300 mg by mouth at bedtime.   . Magnesium 400 MG CAPS Take 400 mg by mouth daily.   . metoCLOPramide (REGLAN) 5 MG tablet Take 5 mg by mouth 3 (three) times daily.  . Multiple Vitamin (MULTIVITAMIN WITH MINERALS) TABS tablet Take 1 tablet by mouth daily.  Marland Kitchen olmesartan-hydrochlorothiazide (BENICAR HCT) 40-12.5 MG tablet Take 1 tablet by mouth daily.  . Omalizumab (XOLAIR [place]) Inject 1 Dose into the skin every 14 (fourteen) days.  Marland Kitchen omeprazole (PRILOSEC) 20 MG capsule Take 20 mg by mouth every evening.   . [DISCONTINUED] TRULICITY 1.5 SE/8.3TD SOPN INJECT 1.5 MG INTO THE SKIN ONCE A WEEK.   No facility-administered encounter medications  on file as of 06/29/2020.   ALLERGIES: No Known Allergies VACCINATION STATUS: Immunization History  Administered Date(s) Administered  . Influenza, High Dose Seasonal PF 10/19/2017, 10/29/2018  . Influenza-Unspecified 10/22/2017  . Tdap 12/23/2017  . Zoster Recombinat (Shingrix) 12/23/2017    Diabetes He presents for his follow-up diabetic visit. He has type 2 diabetes mellitus. Onset time: He was diagnosed at approximate age of 78 years. His disease course has been improving. There are no hypoglycemic associated symptoms. Pertinent negatives for hypoglycemia include no confusion, headaches, pallor or seizures. Pertinent negatives for diabetes include no chest pain, no polydipsia, no polyphagia, no polyuria and no weakness. There are no hypoglycemic complications. Symptoms are improving. Diabetic complications include nephropathy. Risk factors for coronary artery disease include diabetes mellitus, dyslipidemia, hypertension and male sex. His weight is decreasing steadily (He  is status post Roux-en-Y gastric bypass-after successfully losing 123 pounds, he has regained approximately 20 pounds since last visit.  ). He is following a diabetic diet. When asked about meal planning, he reported none. He has not had a previous visit with a dietitian. He participates in exercise intermittently (He participates in golfing activities.). His home blood glucose trend is decreasing steadily. (He presents with significant improvement in his profile.  His point-of-care A1c is 6.5% improved from 8.2%.  He has tolerated his Trulicity 1.5 mg weekly.  He lost 8 pounds more than last visit.  Overall he has lost 123 pounds since his Roux-en-Y surgery. ) An ACE inhibitor/angiotensin II receptor blocker is being taken. Eye exam is current.  Hyperlipidemia This is a chronic problem. The current episode started more than 1 year ago. The problem is uncontrolled. Exacerbating diseases include chronic renal disease and diabetes.  He has no history of obesity. Pertinent negatives include no chest pain, myalgias or shortness of breath. Current antihyperlipidemic treatment includes statins and bile acid squestrants. Compliance problems include adherence to diet.  Risk factors for coronary artery disease include diabetes mellitus, dyslipidemia, hypertension, male sex, obesity and a sedentary lifestyle.  Hypertension This is a chronic problem. The current episode started more than 1 year ago. The problem is uncontrolled. Pertinent negatives include no chest pain, headaches, neck pain, palpitations or shortness of breath. Risk factors for coronary artery disease include diabetes mellitus, dyslipidemia and sedentary lifestyle. Past treatments include angiotensin blockers. Hypertensive end-organ damage includes kidney disease. Identifiable causes of hypertension include chronic renal disease.     Review of Systems  Constitutional: Negative for chills, fever and unexpected weight change.  HENT: Negative for dental problem, mouth sores and trouble swallowing.   Eyes: Negative for visual disturbance.  Respiratory: Negative for cough, choking, chest tightness, shortness of breath and wheezing.   Cardiovascular: Negative for chest pain, palpitations and leg swelling.  Gastrointestinal: Negative for abdominal distention, abdominal pain, constipation, diarrhea, nausea and vomiting.  Endocrine: Negative for polydipsia, polyphagia and polyuria.  Genitourinary: Negative for dysuria, flank pain, hematuria and urgency.  Musculoskeletal: Negative for back pain, gait problem, myalgias and neck pain.  Skin: Negative for pallor, rash and wound.  Neurological: Negative for seizures, syncope, weakness, numbness and headaches.  Psychiatric/Behavioral: Negative for confusion and dysphoric mood.    Objective:    BP 128/83   Pulse 72   Ht 5\' 11"  (1.803 m)   Wt 177 lb 9.6 oz (80.6 kg)   BMI 24.77 kg/m   Wt Readings from Last 3 Encounters:   06/29/20 177 lb 9.6 oz (80.6 kg)  03/23/20 185 lb 6.4 oz (84.1 kg)  11/11/19 177 lb (80.3 kg)    Physical Exam Constitutional:      General: He is not in acute distress.    Appearance: He is well-developed.  HENT:     Head: Normocephalic and atraumatic.  Neck:     Thyroid: No thyromegaly.     Trachea: No tracheal deviation.  Cardiovascular:     Rate and Rhythm: Normal rate.     Pulses:          Dorsalis pedis pulses are 1+ on the right side and 1+ on the left side.       Posterior tibial pulses are 1+ on the right side and 1+ on the left side.     Heart sounds: S1 normal and S2 normal. No murmur heard. No gallop.   Pulmonary:     Effort: Pulmonary  effort is normal. No respiratory distress.     Breath sounds: No wheezing.  Abdominal:     General: There is no distension.     Tenderness: There is no abdominal tenderness. There is no guarding.  Musculoskeletal:     Right shoulder: No swelling or deformity.     Cervical back: Normal range of motion and neck supple.  Skin:    General: Skin is warm and dry.     Findings: No rash.     Nails: There is no clubbing.  Neurological:     Mental Status: He is alert and oriented to person, place, and time.     Cranial Nerves: No cranial nerve deficit.     Sensory: No sensory deficit.     Gait: Gait normal.     Deep Tendon Reflexes: Reflexes are normal and symmetric.  Psychiatric:        Speech: Speech normal.        Behavior: Behavior normal. Behavior is cooperative.        Thought Content: Thought content normal.        Judgment: Judgment normal.    Recent Results (from the past 2160 hour(s))  Basic metabolic panel     Status: None   Collection Time: 06/23/20 12:00 AM  Result Value Ref Range   Potassium 4.0 3.4 - 5.3   Sodium 141 137 - 147   Chloride 107 99 - 242  Basic metabolic panel     Status: Abnormal   Collection Time: 06/23/20 12:00 AM  Result Value Ref Range   Glucose 141    BUN 28 (A) 4 - 21   CO2 23 (A) 13 - 22    Creatinine 2.3 (A) 0.6 - 1.3  Comprehensive metabolic panel     Status: None   Collection Time: 06/23/20 12:00 AM  Result Value Ref Range   Globulin 2.0    Calcium 9.0 8.7 - 10.7   Albumin 4.3 3.5 - 5.0  Hepatic function panel     Status: Abnormal   Collection Time: 06/23/20 12:00 AM  Result Value Ref Range   Alkaline Phosphatase 57 25 - 125   ALT 58 (A) 10 - 40   AST 36 14 - 40   Bilirubin, Total 0.9   HgB A1c     Status: None   Collection Time: 06/29/20 10:52 AM  Result Value Ref Range   Hemoglobin A1C     HbA1c POC (<> result, manual entry)     HbA1c, POC (prediabetic range)     HbA1c, POC (controlled diabetic range) 6.5 0.0 - 7.0 %  POCT UA - Microalbumin     Status: None   Collection Time: 06/29/20 10:53 AM  Result Value Ref Range   Microalbumin Ur, POC 80 mg/L   Creatinine, POC 300 mg/dL   Albumin/Creatinine Ratio, Urine, POC 30-300    Lipid Panel     Component Value Date/Time   CHOL 197 03/16/2020 0000   TRIG 150 03/16/2020 0000   HDL 48 03/16/2020 0000   CHOLHDL 3.9 04/25/2016 1119   VLDL 27 04/25/2016 1119   [place] 123 03/16/2020 0000     Assessment & Plan:   1.  type 2 diabetes mellitus  - Patient has currently controlled asymptomatic type 2 DM since  70 years of age.  Patient is status post Roux-en-Y gastric bypass. He presents with significant improvement in his profile.  His point-of-care A1c is 6.5% improved from 8.2%.  He has tolerated his Trulicity  1.5 mg weekly.  He lost 8 pounds more than last visit.  Overall he has lost 123 pounds since his Roux-en-Y surgery.  - His diabetes is complicated by mild  CKD  and patient remains at a high risk for more acute and chronic complications of diabetes which include CAD, CVA, CKD, retinopathy, and neuropathy. These are all discussed in detail with the patient.  - I have counseled the patient on diet management and weight loss, by adopting a carbohydrate restricted/protein rich diet.  - he acknowledges  that there is a room for improvement in his food and drink choices. - Suggestion is made for him to avoid simple carbohydrates  from his diet including Cakes, Sweet Desserts, Ice Cream, Soda (diet and regular), Sweet Tea, Candies, Chips, Cookies, Store Bought Juices, Alcohol in Excess of  1-2 drinks a day, Artificial Sweeteners,  Coffee Creamer, and "Sugar-free" Products, Lemonade. This will help patient to have more stable blood glucose profile and potentially avoid unintended weight gain.    - I encouraged the patient to switch to  unprocessed or minimally processed complex starch and increased protein intake (animal or plant source), fruits, and vegetables.  - Patient is advised to stick to a routine mealtimes to eat 3 meals  a day and avoid unnecessary snacks ( to snack only to correct hypoglycemia).   - I have approached patient with the following individualized plan to manage diabetes and patient agrees:   -He has benefited from reintroduction of GLP-1 receptor agonist.  He is advised to continue Trulicity 1.5 mg subcutaneously weekly.    - Patient specific target  A1c;  LDL, HDL, Triglycerides,  were discussed in detail.  2) BP/HTN: -His blood pressure is controlled to target. He is advised to continue on olmesartan/hydrochlorothiazide .  He has a chance to discontinue amlodipine.  He would like to discuss it with his PMD.  3) Lipids/HPL: Recent lipid panel showed increased LDL at 123 from 89.  He was approached for intervention with a statin, patient would like to discuss this with his PMD.       4)  Weight/Diet: Status post Roux-en-Y gastric bypass, successfully lost up to 123 pounds overall, lately he is regaining approximately 20 pounds since last visit.   He is encouraged to eat small frequent meals.  His current weight is optimal for him.  CDE Consult in progress , exercise, and detailed carbohydrates information provided.  5) Chronic Care/Health Maintenance:  -Patient is on  ACEI/ARB and Statin medications and encouraged to continue to follow up with Ophthalmology, Podiatrist at least yearly or according to recommendations, and advised to   stay away from smoking. I have recommended yearly flu vaccine and pneumonia vaccination at least every 5 years; moderate intensity exercise for up to 150 minutes weekly; and  sleep for at least 7 hours a day. -He is advised to get his colonoscopy through GI.  His constipation is likely due to his pain medications.   His screening ABI was negative for PAD in March 2022.  This study will be repeated in March 2027, or sooner if needed.  - I advised patient to maintain close follow up with Vasireddy, Lanetta Inch, MD for primary care needs.   I spent 42 minutes in the care of the patient today including review of labs from [facility], Lipids, Thyroid Function, Hematology (current and previous including abstractions from other facilities); face-to-face time discussing  his blood glucose readings/logs, discussing hypoglycemia and hyperglycemia episodes and symptoms, medications doses, his  options of short and long term treatment based on the latest standards of care / guidelines;  discussion about incorporating lifestyle medicine;  and documenting the encounter.    Please refer to Patient Instructions for Blood Glucose Monitoring and Insulin/Medications Dosing Guide"  in media tab for additional information. Please  also refer to " Patient Self Inventory" in the Media  tab for reviewed elements of pertinent patient history.  Allen [name] participated in the discussions, expressed understanding, and voiced agreement with the above plans.  All questions were answered to his satisfaction. he is encouraged to contact clinic should he have any questions or concerns prior to his return visit.   Follow up plan: In 6 months with labs. Glade Lloyd, MD Phone: [phone]  Fax: [phone]  -  This note was partially dictated with voice  recognition software. Similar sounding words can be transcribed inadequately or may not  be corrected upon review.  06/29/2020, 12:55 PM

## 2020-10-05 ENCOUNTER — Ambulatory Visit: Payer: BLUE CROSS/BLUE SHIELD | Admitting: Dermatology

## 2020-12-12 ENCOUNTER — Ambulatory Visit: Payer: BLUE CROSS/BLUE SHIELD | Admitting: Internal Medicine

## 2020-12-14 ENCOUNTER — Other Ambulatory Visit: Payer: Self-pay | Admitting: Surgery

## 2020-12-14 DIAGNOSIS — Z9884 Bariatric surgery status: Secondary | ICD-10-CM

## 2020-12-14 DIAGNOSIS — R627 Adult failure to thrive: Secondary | ICD-10-CM

## 2020-12-14 DIAGNOSIS — R42 Dizziness and giddiness: Secondary | ICD-10-CM

## 2020-12-14 DIAGNOSIS — K5900 Constipation, unspecified: Secondary | ICD-10-CM

## 2020-12-29 ENCOUNTER — Ambulatory Visit: Payer: Medicare Other | Admitting: "Endocrinology

## 2021-01-31 ENCOUNTER — Encounter: Payer: Self-pay | Admitting: "Endocrinology

## 2021-01-31 ENCOUNTER — Ambulatory Visit (INDEPENDENT_AMBULATORY_CARE_PROVIDER_SITE_OTHER): Payer: Medicare Other | Admitting: Internal Medicine

## 2021-01-31 ENCOUNTER — Other Ambulatory Visit: Payer: Self-pay

## 2021-01-31 ENCOUNTER — Ambulatory Visit (INDEPENDENT_AMBULATORY_CARE_PROVIDER_SITE_OTHER): Payer: Medicare Other | Admitting: "Endocrinology

## 2021-01-31 ENCOUNTER — Encounter: Payer: Self-pay | Admitting: Internal Medicine

## 2021-01-31 VITALS — BP 148/90 | HR 68 | Ht 71.0 in | Wt 169.6 lb

## 2021-01-31 DIAGNOSIS — E1165 Type 2 diabetes mellitus with hyperglycemia: Secondary | ICD-10-CM | POA: Diagnosis not present

## 2021-01-31 DIAGNOSIS — J4540 Moderate persistent asthma, uncomplicated: Secondary | ICD-10-CM

## 2021-01-31 DIAGNOSIS — E782 Mixed hyperlipidemia: Secondary | ICD-10-CM | POA: Diagnosis not present

## 2021-01-31 DIAGNOSIS — E559 Vitamin D deficiency, unspecified: Secondary | ICD-10-CM

## 2021-01-31 DIAGNOSIS — I10 Essential (primary) hypertension: Secondary | ICD-10-CM

## 2021-01-31 LAB — POCT GLYCOSYLATED HEMOGLOBIN (HGB A1C): HbA1c, POC (controlled diabetic range): 5.3 % (ref 0.0–7.0)

## 2021-01-31 MED ORDER — TRULICITY 0.75 MG/0.5ML ~~LOC~~ SOAJ: 0.7500 mg | SUBCUTANEOUS | 1 refills | Status: DC

## 2021-01-31 NOTE — Assessment & Plan Note (Signed)
Never smoker with onset allergy/sinus dz in childhood and asthma around age 71 - Xolair rx per Luan Pulling started around 2010 marked improvement in asthma > sinus dz on no maint rx as of 1st  pulmonary eval 11/11/2019   All goals of chronic asthma control met including optimal function and elimination of symptoms with minimal need for rescue therapy on maint rx for xolair.  Contingencies discussed in full including contacting this office immediately if not controlling the symptoms using the rule of two's.     Will inquire as to whether monthly injections can be considered at this point         Each maintenance medication was reviewed in detail including emphasizing most importantly the difference between maintenance and prns and under what circumstances the prns are to be triggered using an action plan format where appropriate.  Total time for H and P, chart review, counseling, reviewing hfa  device(s) and generating customized AVS unique to this office visit / same day charting = 22 min

## 2021-01-31 NOTE — Progress Notes (Signed)
01/31/2021   Endocrinology follow-up note   Subjective:    Patient ID: Allen [name], male    DOB: Sep 23, 1950. Patient is being seen in follow-up for the management of type 2 diabetes.    Vasireddy, Lanetta Inch, MD  Past Medical History:  Diagnosis Date   Anemia    on iron since gastric bypass   Arthritis    mild   Asthma    Chronic kidney disease    stage 3, now resolved since weight loss   Claustrophobia    Diabetes mellitus, type II ([place])    type 2 metformin   GERD (gastroesophageal reflux disease)    takes nexium   Hyperlipidemia    Hypertension    Pneumonia 15 yrs ago   SCC (squamous cell carcinoma) 12/29/2007   Left Mid Preauricular(Mod.Diff) (Dr. Lacinda Axon)   SCC (squamous cell carcinoma) 12/29/2007   Right Upper Dorsal Nose(Well Diff) (Dr. Lacinda Axon)   SCC (squamous cell carcinoma) 12/29/2007   Anti-Tragus Right Ear(Mod.Diff) (Dr. Lacinda Axon)   SCC (squamous cell carcinoma) 06/06/2010   Left Nose(in Situ) (curet and 5FU)   SCC (squamous cell carcinoma) 06/06/2010   Right Check(Well Diff) (curet and 5FU)   SCC (squamous cell carcinoma) 07/20/2013   Right Sideburn(KA) (curet and 5FU)   SCC (squamous cell carcinoma) 06/27/2015   Right Neck Sup(in Situ) (curet and 5FU)   SCC (squamous cell carcinoma) 06/27/2015   Right Neck Inf(in Situ) (curet and 5FU)   SCC (squamous cell carcinoma) 01/02/2017   Left Wrist(in Situ) (curet and 5FU)   SCC (squamous cell carcinoma) 01/02/2017   Right Outer Cheek(in Situ) (curet and 5FU)   SCC (squamous cell carcinoma) 06/23/2018   Left Sideburn(in Situ) (curet and 5FU)   SCC (squamous cell carcinoma) 06/23/2018   Left Upper Lip(Well Diff) (MOH's)   SCC (squamous cell carcinoma) 06/23/2018   Right Ear Rim(in Situ) (curet and 5FU)   Sleep apnea    cpap, no cpap since may 2018 weight loss 80 labs    Superficial nodular basal cell carcinoma (BCC) 06/27/2015   Left Rim of Ear (curet and 5FU)   Past Surgical History:  Procedure  Laterality Date   ANTERIOR CERVICAL DECOMP/DISCECTOMY FUSION Right 10/10/2018   Procedure: Right sided revision with hardware removal at Cervical three-four, Cervical four-five Anterior cervical decompression/discectomy/fusion with corpectomy at Cervical five;  Surgeon: Erline Levine, MD;  Location: [facility];  Service: Neurosurgery;  Laterality: Right;   CARDIAC CATHETERIZATION     patient reports he thinks he had one over 25 years ago   carpal tunnel Bilateral    CERVICAL VERTEBRAE EXCISION     and cleaning   CHOLECYSTECTOMY     cyst removed left hand  01-28-17   ESOPHAGOGASTRODUODENOSCOPY (EGD) WITH PROPOFOL N/A 01/31/2017   Procedure: ESOPHAGOGASTRODUODENOSCOPY (EGD) WITH PROPOFOL;  Surgeon: Alphonsa Overall, MD;  Location: [facility];  Service: General;  Laterality: N/A;   EYE SURGERY     bilateral cataract removal   GASTRIC ROUX-EN-Y N/A 06/04/2016   Procedure: LAPAROSCOPIC ROUX-EN-Y GASTRIC BYPASS WITH UPPER ENDOSCOPY;  Surgeon: Clovis Riley, MD;  Location: [facility];  Service: General;  Laterality: N/A;   NASAL SINUS SURGERY     POSTERIOR CERVICAL FUSION/FORAMINOTOMY N/A 06/08/2019   Procedure: Cervical Three to Cervical Seven Posterior cervical decompression and fusion;  Surgeon: Erline Levine, MD;  Location: [facility];  Service: Neurosurgery;  Laterality: N/A;  Cervical Three to Cervical Seven Posterior cervical decompression and fusion   RADIOLOGY WITH ANESTHESIA N/A 04/28/2019  Procedure: MRI RADIOLOGY WITH ANESTHESIA;  Surgeon: Radiologist, Medication, MD;  Location: [facility];  Service: Radiology;  Laterality: N/A;   TRIGGER FINGER RELEASE     Social History   Socioeconomic History   Marital status: Married    Spouse name: Not on file   Number of children: Not on file   Years of education: Not on file   Highest education level: Not on file  Occupational History   Not on file  Tobacco Use   Smoking status: Never   Smokeless tobacco: Never  Vaping Use   Vaping Use: Never used   Substance and Sexual Activity   Alcohol use: No   Drug use: No   Sexual activity: Yes  Other Topics Concern   Not on file  Social History Narrative   Not on file   Social Determinants of Health   Financial Resource Strain: Not on file  Food Insecurity: Not on file  Transportation Needs: Not on file  Physical Activity: Not on file  Stress: Not on file  Social Connections: Not on file   Outpatient Encounter Medications as of 01/31/2021  Medication Sig   Dulaglutide (TRULICITY) 6.21 HY/8.6VH SOPN Inject 0.75 mg into the skin once a week.   olmesartan (BENICAR) 40 MG tablet Take 40 mg by mouth daily.   acetaminophen (TYLENOL) 500 MG tablet Take 1,000 mg by mouth every 8 (eight) hours as needed for mild pain.   albuterol (PROAIR HFA) 108 (90 Base) MCG/ACT inhaler 2 puffs every 4 hours as needed only  if your can't catch your breath   amLODipine (NORVASC) 5 MG tablet Take 10 mg by mouth daily.   Calcium Carb-Cholecalciferol (CALCIUM 500+D3 PO) Take 1 tablet by mouth 3 (three) times daily.    calcium carbonate (TUMS - DOSED IN MG ELEMENTAL CALCIUM) 500 MG chewable tablet Chew 1-2 tablets by mouth 3 (three) times daily as needed for indigestion or heartburn.   escitalopram (LEXAPRO) 20 MG tablet Take 20 mg by mouth daily.   gabapentin (NEURONTIN) 300 MG capsule Take 300 mg by mouth at bedtime.   Magnesium 400 MG CAPS Take 400 mg by mouth daily.    metoCLOPramide (REGLAN) 5 MG tablet Take 5 mg by mouth 3 (three) times daily.   Multiple Vitamin (MULTIVITAMIN WITH MINERALS) TABS tablet Take 1 tablet by mouth daily.   Omalizumab (XOLAIR [place]) Inject 1 Dose into the skin every 14 (fourteen) days.   omeprazole (PRILOSEC) 20 MG capsule Take 20 mg by mouth every evening.   [DISCONTINUED] Dulaglutide (TRULICITY) 1.5 QI/6.9GE SOPN Inject 1.5 mg into the skin once a week.   [DISCONTINUED] olmesartan-hydrochlorothiazide (BENICAR HCT) 40-12.5 MG tablet Take 1 tablet by mouth daily.   No  facility-administered encounter medications on file as of 01/31/2021.   ALLERGIES: No Known Allergies VACCINATION STATUS: Immunization History  Administered Date(s) Administered   Influenza, High Dose Seasonal PF 10/19/2017, 10/29/2018   Influenza-Unspecified 10/22/2017   Tdap 12/23/2017   Zoster Recombinat (Shingrix) 12/23/2017    Diabetes He presents for his follow-up diabetic visit. He has type 2 diabetes mellitus. Onset time: He was diagnosed at approximate age of 4 years. His disease course has been improving. There are no hypoglycemic associated symptoms. Pertinent negatives for hypoglycemia include no confusion, headaches, pallor or seizures. Pertinent negatives for diabetes include no chest pain, no polydipsia, no polyphagia, no polyuria and no weakness. There are no hypoglycemic complications. Symptoms are improving. Diabetic complications include nephropathy. Risk factors for coronary artery disease include diabetes mellitus,  dyslipidemia, hypertension and male sex. His weight is decreasing steadily (He is status post Roux-en-Y gastric bypass-after successfully losing 123 pounds, he has regained approximately 20 pounds since last visit.  ). He is following a diabetic diet. When asked about meal planning, he reported none. He has not had a previous visit with a dietitian. He participates in exercise intermittently (He participates in golfing activities.). His home blood glucose trend is decreasing steadily. Slager presents with continued improvement in his glycemic profile, point-of-care A1c of 5.3% improving from 8.2% last year.  He is tolerating his Trulicity 1.5 mg weekly.  He lost 8 more pounds since last visit, overall 130+ pounds of weight loss since his Roux-en-Y surgery.   ) An ACE inhibitor/angiotensin II receptor blocker is being taken. Eye exam is current.  Hyperlipidemia This is a chronic problem. The current episode started more than 1 year ago. The problem is uncontrolled.  Exacerbating diseases include chronic renal disease and diabetes. He has no history of obesity. Pertinent negatives include no chest pain, myalgias or shortness of breath. Current antihyperlipidemic treatment includes statins and bile acid squestrants. Compliance problems include adherence to diet.  Risk factors for coronary artery disease include diabetes mellitus, dyslipidemia, hypertension, male sex, obesity and a sedentary lifestyle.  Hypertension This is a chronic problem. The current episode started more than 1 year ago. The problem is uncontrolled. Pertinent negatives include no chest pain, headaches, neck pain, palpitations or shortness of breath. Risk factors for coronary artery disease include diabetes mellitus, dyslipidemia and sedentary lifestyle. Past treatments include angiotensin blockers. Hypertensive end-organ damage includes kidney disease. Identifiable causes of hypertension include chronic renal disease.    Review of Systems  Constitutional:  Negative for chills, fever and unexpected weight change.  HENT:  Negative for dental problem, mouth sores and trouble swallowing.   Eyes:  Negative for visual disturbance.  Respiratory:  Negative for cough, choking, chest tightness, shortness of breath and wheezing.   Cardiovascular:  Negative for chest pain, palpitations and leg swelling.  Gastrointestinal:  Negative for abdominal distention, abdominal pain, constipation, diarrhea, nausea and vomiting.  Endocrine: Negative for polydipsia, polyphagia and polyuria.  Genitourinary:  Negative for dysuria, flank pain, hematuria and urgency.  Musculoskeletal:  Negative for back pain, gait problem, myalgias and neck pain.  Skin:  Negative for pallor, rash and wound.  Neurological:  Negative for seizures, syncope, weakness, numbness and headaches.  Psychiatric/Behavioral:  Negative for confusion and dysphoric mood.    Objective:    BP (!) 148/90    Pulse 68    Ht 5\' 11"  (1.803 m)    Wt 169 lb  9.6 oz (76.9 kg)    BMI 23.65 kg/m   Wt Readings from Last 3 Encounters:  01/31/21 172 lb 1.3 oz (78.1 kg)  01/31/21 169 lb 9.6 oz (76.9 kg)  06/29/20 177 lb 9.6 oz (80.6 kg)     Recent Results (from the past 2160 hour(s))  HgB A1c     Status: None   Collection Time: 01/31/21 10:17 AM  Result Value Ref Range   Hemoglobin A1C     HbA1c POC (<> result, manual entry)     HbA1c, POC (prediabetic range)     HbA1c, POC (controlled diabetic range) 5.3 0.0 - 7.0 %   Lipid Panel     Component Value Date/Time   CHOL 197 03/16/2020 0000   TRIG 150 03/16/2020 0000   HDL 48 03/16/2020 0000   CHOLHDL 3.9 04/25/2016 1119   VLDL 27 04/25/2016  1119   [place] 03/16/2020 0000     Assessment & Plan:   1.  type 2 diabetes mellitus  - Patient has currently controlled asymptomatic type 2 DM since  71 years of age.  Patient is status post Roux-en-Y gastric bypass. Salih presents with continued improvement in his glycemic profile, point-of-care A1c of 5.3% improving from 8.2% last year.  He is tolerating his Trulicity 1.5 mg weekly.  He lost 8 more pounds since last visit, overall 130+ pounds of weight loss since his Roux-en-Y surgery.    - His diabetes is complicated by mild  CKD  and patient remains at a high risk for more acute and chronic complications of diabetes which include CAD, CVA, CKD, retinopathy, and neuropathy. These are all discussed in detail with the patient.  - I have counseled the patient on diet management and weight loss, by adopting a carbohydrate restricted/protein rich diet.  - he acknowledges that there is a room for improvement in his food and drink choices. - Suggestion is made for him to avoid simple carbohydrates  from his diet including Cakes, Sweet Desserts, Ice Cream, Soda (diet and regular), Sweet Tea, Candies, Chips, Cookies, Store Bought Juices, Alcohol in Excess of  1-2 drinks a day, Artificial Sweeteners,  Coffee Creamer, and "Sugar-free" Products, Lemonade.  This will help patient to have more stable blood glucose profile and potentially avoid unintended weight gain.   - I encouraged the patient to switch to  unprocessed or minimally processed complex starch and increased protein intake (animal or plant source), fruits, and vegetables.  - Patient is advised to stick to a routine mealtimes to eat 3 meals  a day and avoid unnecessary snacks ( to snack only to correct hypoglycemia).   - I have approached patient with the following individualized plan to manage diabetes and patient agrees:   -He has benefited from reintroduction of GLP-1 receptor agonist.  - given his presentation with A1c of 5.3%, and the fact that he does not want to lose anymore weight, he is advised to lower his Trulicity to 5.17 mg subcutaneously weekly.  He is advised to continue Trulicity 1.5 mg subcutaneously weekly.    - Patient specific target  A1c;  LDL, HDL, Triglycerides,  were discussed in detail.  2) BP/HTN: -His blood pressure is controlled to target. He is advised to continue on olmesartan .  He has a chance to discontinue amlodipine.  He has bradycardia, worsening.  He is scheduled to see his nephrologist today.    3) Lipids/HPL: Recent lipid panel showed increased LDL at 123 from 89.  He was approached for intervention with a statin, patient would like to discuss this with his PMD.       4)  Weight/Diet: Status post Roux-en-Y gastric bypass, successfully lost up to 130 pounds.   His current weight is optimal for him.  CDE Consult in progress , exercise, and detailed carbohydrates information provided.  5) Chronic Care/Health Maintenance:  -Patient is on ACEI/ARB and Statin medications and encouraged to continue to follow up with Ophthalmology, Podiatrist at least yearly or according to recommendations, and advised to   stay away from smoking. I have recommended yearly flu vaccine and pneumonia vaccination at least every 5 years; moderate intensity exercise for up  to 150 minutes weekly; and  sleep for at least 7 hours a day. -He is advised to get his colonoscopy through GI.  His constipation is likely due to his pain medications.   His screening  ABI was negative for PAD in March 2022.  This study will be repeated in March 2027, or sooner if needed.  - I advised patient to maintain close follow up with Vasireddy, Lanetta Inch, MD for primary care needs.   I spent 41 minutes in the care of the patient today including review of labs from [place], Lipids, Thyroid Function, Hematology (current and previous including abstractions from other facilities); face-to-face time discussing  his blood glucose readings/logs, discussing hypoglycemia and hyperglycemia episodes and symptoms, medications doses, his options of short and long term treatment based on the latest standards of care / guidelines;  discussion about incorporating lifestyle medicine;  and documenting the encounter.    Please refer to Patient Instructions for Blood Glucose Monitoring and Insulin/Medications Dosing Guide"  in media tab for additional information. Please  also refer to " Patient Self Inventory" in the Media  tab for reviewed elements of pertinent patient history.  [place] participated in the discussions, expressed understanding, and voiced agreement with the above plans.  All questions were answered to his satisfaction. he is encouraged to contact clinic should he have any questions or concerns prior to his return visit.   Follow up plan: In 6 months with labs. Glade Lloyd, MD Phone: [phone]  Fax: [phone]  -  This note was partially dictated with voice recognition software. Similar sounding words can be transcribed inadequately or may not  be corrected upon review.  01/31/2021, 11:56 AM

## 2021-01-31 NOTE — Patient Instructions (Addendum)
No change in your xolair treatment plan- I will inquire of the xolair representative whether we could consider extending you to once a month at this point   Please schedule a follow up visit in 12  months but call sooner if needed

## 2021-01-31 NOTE — Patient Instructions (Signed)

## 2021-01-31 NOTE — Progress Notes (Signed)
Allen [name], male    DOB: [birth year],    MRN: [id number]   Brief patient profile:  32 yowm former pt of Dr Luan Pulling never smoker allergies/sinus problems all his life and then asthma around the age of 81 and difficult to control on multiple maint rx  but big change w/in the first year started xolair ? Around 2010 but still bothered by freq ear /sinus infections f/b Dr Herbie Saxon but rarely if ever using any saba and no maint rx at all so self referred back to [facility] to renew xolair    S/p gastric bypass in 2018 lost almost 100 lbs made a bid difference no longer DM    History of Present Illness  11/11/2019  Pulmonary/ 1st office eval/ [name] / [facility]  Chief Complaint  Patient presents with   Pulmonary Consult    Former patient of Dr Luan Pulling. He states his breathing is doing well today.  He is requesting [name] to sign form in order for him to continue Xolair at infusion clinic in [place].   Dyspnea:  Not limited by breathing from desired activities  / regular walking including including hills Cough: minimal assocs pnds  Sleep: 30 degrees electric bed  SABA use: rarely Rec Continue your medications including xolair. If you additional support for Xolair that we can't provide thru this office, I recommend you see Dr Ernst Bowler with [facility] in [place] office. Only use your albuterol as a rescue medication to be used if you can't catch your breath Please schedule a follow up visit in 12 months but call sooner if needed     01/31/2021  f/u ov/[facility]/Tatyanna Cronk re: asthma maint on xolair   Chief Complaint  Patient presents with   Follow-up    Breathing is continuing to improve.   Dyspnea:  walking including hills 30 min 3 -4 x per week  Cough: none Sleeping: no resp cc  SABA use: keeps it handy never uses  02: none Covid status: vax all including bivalent Lung cancer screening: never    No obvious day to day or daytime variability or assoc  excess/ purulent sputum or mucus plugs or hemoptysis or cp or chest tightness, subjective wheeze or overt sinus or hb symptoms.   Sleeping  without nocturnal  or early am exacerbation  of respiratory  c/o's or need for noct saba. Also denies any obvious fluctuation of symptoms with weather or environmental changes or other aggravating or alleviating factors except as outlined above   No unusual exposure hx or h/o childhood pna/ asthma or knowledge of premature birth.  Current Allergies, Complete Past Medical History, Past Surgical History, Family History, and Social History were reviewed in [employer] record.  ROS  The following are not active complaints unless bolded Hoarseness, sore throat, dysphagia, dental problems, itching, sneezing,  nasal congestion or discharge of excess mucus or purulent secretions, ear ache,   fever, chills, sweats, unintended wt loss or wt gain, classically pleuritic or exertional cp,  orthopnea pnd or arm/hand swelling  or leg swelling, presyncope, palpitations, abdominal pain, anorexia, nausea, vomiting, diarrhea  or change in bowel habits or change in bladder habits, change in stools or change in urine, dysuria, hematuria,  rash, arthralgias, visual complaints, headache, numbness, weakness or ataxia or problems with walking or coordination,  change in mood or  memory.        Current Meds  Medication Sig   acetaminophen (TYLENOL) 500 MG tablet Take 1,000 mg  by mouth every 8 (eight) hours as needed for mild pain.   albuterol (PROAIR HFA) 108 (90 Base) MCG/ACT inhaler 2 puffs every 4 hours as needed only  if your can't catch your breath   amLODipine (NORVASC) 5 MG tablet Take 10 mg by mouth daily.   Calcium Carb-Cholecalciferol (CALCIUM 500+D3 PO) Take 1 tablet by mouth 3 (three) times daily.    calcium carbonate (TUMS - DOSED IN MG ELEMENTAL CALCIUM) 500 MG chewable tablet Chew 1-2 tablets by mouth 3 (three) times daily as needed for indigestion  or heartburn.   Dulaglutide (TRULICITY) 2.94 TM/5.4YT SOPN Inject 0.75 mg into the skin once a week.   escitalopram (LEXAPRO) 20 MG tablet Take 20 mg by mouth daily.   gabapentin (NEURONTIN) 300 MG capsule Take 300 mg by mouth at bedtime.   Magnesium 400 MG CAPS Take 400 mg by mouth daily.    metoCLOPramide (REGLAN) 5 MG tablet Take 5 mg by mouth 3 (three) times daily.   Multiple Vitamin (MULTIVITAMIN WITH MINERALS) TABS tablet Take 1 tablet by mouth daily.   olmesartan (BENICAR) 40 MG tablet Take 40 mg by mouth daily.   Omalizumab (XOLAIR [place]) Inject 1 Dose into the skin every 14 (fourteen) days.   omeprazole (PRILOSEC) 20 MG capsule Take 20 mg by mouth every evening.              Past Medical History:  Diagnosis Date   Anemia    on iron since gastric bypass   Arthritis    mild   Asthma    Chronic kidney disease    stage 3, now resolved since weight loss   Claustrophobia    Diabetes mellitus, type II ([facility])    type 2 metformin   GERD (gastroesophageal reflux disease)    takes nexium   Hyperlipidemia    Hypertension    Pneumonia 15 yrs ago   SCC (squamous cell carcinoma) 12/29/2007   Left Mid Preauricular(Mod.Diff) (Dr. Lacinda Axon)   SCC (squamous cell carcinoma) 12/29/2007   Right Upper Dorsal Nose(Well Diff) (Dr. Lacinda Axon)   SCC (squamous cell carcinoma) 12/29/2007   Anti-Tragus Right Ear(Mod.Diff) (Dr. Lacinda Axon)   SCC (squamous cell carcinoma) 06/06/2010   Left Nose(in Situ) (curet and 5FU)   SCC (squamous cell carcinoma) 06/06/2010   Right Check(Well Diff) (curet and 5FU)   SCC (squamous cell carcinoma) 07/20/2013   Right Sideburn(KA) (curet and 5FU)   SCC (squamous cell carcinoma) 06/27/2015   Right Neck Sup(in Situ) (curet and 5FU)   SCC (squamous cell carcinoma) 06/27/2015   Right Neck Inf(in Situ) (curet and 5FU)   SCC (squamous cell carcinoma) 01/02/2017   Left Wrist(in Situ) (curet and 5FU)   SCC (squamous cell carcinoma) 01/02/2017   Right Outer Cheek(in Situ) (curet and  5FU)   SCC (squamous cell carcinoma) 06/23/2018   Left Sideburn(in Situ) (curet and 5FU)   SCC (squamous cell carcinoma) 06/23/2018   Left Upper Lip(Well Diff) (MOH's)   SCC (squamous cell carcinoma) 06/23/2018   Right Ear Rim(in Situ) (curet and 5FU)   Sleep apnea    cpap, no cpap since may 2018 weight loss 80 labs    Superficial nodular basal cell carcinoma (BCC) 06/27/2015   Left Rim of Ear (curet and 5FU)         Objective:     Wt Readings from Last 3 Encounters:  01/31/21 172 lb 1.3 oz (78.1 kg)  01/31/21 169 lb 9.6 oz (76.9 kg)  06/29/20 177 lb 9.6 oz (80.6 kg)  Vital signs reviewed  01/31/2021  - Note at rest 02 sats  98% on RA   General appearance:    amb pleasant wm nad   HEENT : pt wearing mask not removed for exam due to covid -19 concerns.    NECK :  without JVD/Nodes/TM/ nl carotid upstrokes bilaterally   LUNGS: no acc muscle use,  Nl contour chest which is clear to A and P bilaterally without cough on insp or exp maneuvers   CV:  RRR  no s3 or murmur or increase in P2, and no edema   ABD:  soft and nontender with nl inspiratory excursion in the supine position. No bruits or organomegaly appreciated, bowel sounds nl  MS:  Nl gait/ ext warm without deformities, calf tenderness, cyanosis or clubbing No obvious joint restrictions   SKIN: warm and dry without lesions    NEURO:  alert, approp, nl sensorium with  no motor or cerebellar deficits apparent.            Assessment

## 2021-02-02 ENCOUNTER — Telehealth: Payer: Self-pay

## 2021-02-02 NOTE — Telephone Encounter (Signed)
ATC patient. LMTCB with [facility] office number  

## 2021-02-02 NOTE — Telephone Encounter (Signed)
-----   Message from Tanda Rockers, MD sent at 02/02/2021  8:51 AM EST ----- Let pt know I had his records reviewed by a xolair expert and he recommends q month dosing and if start having any more frequent breakthru sinus symptoms or cough/ wheeze/ sob or increase need for albuterol go back to q 2 weeks

## 2021-02-03 NOTE — Telephone Encounter (Signed)
Called and spoke to patient he is agreeable to change and voiced understanding  about change in treatment.  Will route to Dr. [name] so he is aware

## 2021-02-09 ENCOUNTER — Other Ambulatory Visit: Payer: Self-pay

## 2021-02-09 ENCOUNTER — Telehealth: Payer: Self-pay | Admitting: Internal Medicine

## 2021-02-09 NOTE — Telephone Encounter (Signed)
Patient needs to be changed to Xolair injection monthly, currently gets injection every two weeks.   Allen [name] is this something I need to have you look into for changing? He uses the infusion clinic in [place]

## 2021-02-10 NOTE — Telephone Encounter (Signed)
Called [employer] to request new form for Xolair order addendum be sent our way. Left VM with nursing team with direct office number and phone number  Phone: [phone]  Knox Saliva, PharmD, MPH, BCPS Clinical Pharmacist (Rheumatology and Pulmonology)

## 2021-02-13 NOTE — Telephone Encounter (Addendum)
Updated Xolair orders for 225mg  SQ every 4 weeks sent to [facility] in [place]. Called patient to notify. He is due for next dose on 02/16/21 but will plan to call [facility] to see if they can get his next Xolair scheduled for 03/02/21 (4 weeks from last Xolair dose)  Fax: [phone] Phone: [phone]  Knox Saliva, PharmD, MPH, BCPS Clinical Pharmacist (Rheumatology and Pulmonology)

## 2021-02-13 NOTE — Telephone Encounter (Signed)
Returned call to [facility] since we did not receive blank order forms. Nurse advised to call them again if we don't receive today  Phone: [phone]  Knox Saliva, PharmD, MPH, BCPS Clinical Pharmacist (Rheumatology and Pulmonology)

## 2021-07-28 LAB — LIPID PANEL
Chol/HDL Ratio: 3.1 ratio (ref 0.0–5.0)
Cholesterol, Total: 175 mg/dL (ref 100–199)
HDL: 56 mg/dL (ref >39)
LDL Chol Calc (NIH): 94 mg/dL (ref 0–99)
Triglycerides: 144 mg/dL (ref 0–149)
VLDL Cholesterol Cal: 25 mg/dL (ref 5–40)

## 2021-07-28 LAB — COMPREHENSIVE METABOLIC PANEL
ALT: 18 IU/L (ref 0–44)
AST: 20 IU/L (ref 0–40)
Albumin/Globulin Ratio: 2 (ref 1.2–2.2)
Albumin: 4.5 g/dL (ref 3.7–4.7)
Alkaline Phosphatase: 76 IU/L (ref 44–121)
BUN/Creatinine Ratio: 11 (ref 10–24)
BUN: 42 mg/dL — ABNORMAL HIGH (ref 8–27)
Bilirubin Total: 0.6 mg/dL (ref 0.0–1.2)
CO2: 20 mmol/L (ref 20–29)
Calcium: 9.1 mg/dL (ref 8.6–10.2)
Chloride: 104 mmol/L (ref 96–106)
Creatinine, Ser: 3.66 mg/dL — ABNORMAL HIGH (ref 0.76–1.27)
Globulin, Total: 2.2 g/dL (ref 1.5–4.5)
Glucose: 152 mg/dL — ABNORMAL HIGH (ref 70–99)
Potassium: 4.4 mmol/L (ref 3.5–5.2)
Sodium: 140 mmol/L (ref 134–144)
Total Protein: 6.7 g/dL (ref 6.0–8.5)
eGFR: 17 mL/min/{1.73_m2} — ABNORMAL LOW (ref >59)

## 2021-08-01 ENCOUNTER — Encounter: Payer: Self-pay | Admitting: "Endocrinology

## 2021-08-01 ENCOUNTER — Ambulatory Visit (INDEPENDENT_AMBULATORY_CARE_PROVIDER_SITE_OTHER): Payer: Medicare Other | Admitting: "Endocrinology

## 2021-08-01 VITALS — BP 150/82 | HR 56 | Ht 71.0 in | Wt 177.6 lb

## 2021-08-01 DIAGNOSIS — E782 Mixed hyperlipidemia: Secondary | ICD-10-CM

## 2021-08-01 DIAGNOSIS — I10 Essential (primary) hypertension: Secondary | ICD-10-CM

## 2021-08-01 DIAGNOSIS — E559 Vitamin D deficiency, unspecified: Secondary | ICD-10-CM | POA: Diagnosis not present

## 2021-08-01 DIAGNOSIS — E1165 Type 2 diabetes mellitus with hyperglycemia: Secondary | ICD-10-CM | POA: Diagnosis not present

## 2021-08-01 LAB — POCT GLYCOSYLATED HEMOGLOBIN (HGB A1C): HbA1c, POC (controlled diabetic range): 6.3 % (ref 0.0–7.0)

## 2021-08-01 MED ORDER — TRULICITY 0.75 MG/0.5ML ~~LOC~~ SOAJ: 0.7500 mg | SUBCUTANEOUS | 1 refills | Status: DC

## 2021-08-01 NOTE — Patient Instructions (Signed)

## 2021-08-01 NOTE — Progress Notes (Signed)
08/01/2021   Endocrinology follow-up note   Subjective:    Patient ID: Allen [name], male    DOB: [birth year]. Patient is being seen in follow-up for the management of type 2 diabetes.    Vasireddy, Lanetta Inch, MD  Past Medical History:  Diagnosis Date   Anemia    on iron since gastric bypass   Arthritis    mild   Asthma    Chronic kidney disease    stage 3, now resolved since weight loss   Claustrophobia    Diabetes mellitus, type II ([place])    type 2 metformin   GERD (gastroesophageal reflux disease)    takes nexium   Hyperlipidemia    Hypertension    Pneumonia 15 yrs ago   SCC (squamous cell carcinoma) 12/29/2007   Left Mid Preauricular(Mod.Diff) (Dr. Lacinda Axon)   SCC (squamous cell carcinoma) 12/29/2007   Right Upper Dorsal Nose(Well Diff) (Dr. Lacinda Axon)   SCC (squamous cell carcinoma) 12/29/2007   Anti-Tragus Right Ear(Mod.Diff) (Dr. Lacinda Axon)   SCC (squamous cell carcinoma) 06/06/2010   Left Nose(in Situ) (curet and 5FU)   SCC (squamous cell carcinoma) 06/06/2010   Right Check(Well Diff) (curet and 5FU)   SCC (squamous cell carcinoma) 07/20/2013   Right Sideburn(KA) (curet and 5FU)   SCC (squamous cell carcinoma) 06/27/2015   Right Neck Sup(in Situ) (curet and 5FU)   SCC (squamous cell carcinoma) 06/27/2015   Right Neck Inf(in Situ) (curet and 5FU)   SCC (squamous cell carcinoma) 01/02/2017   Left Wrist(in Situ) (curet and 5FU)   SCC (squamous cell carcinoma) 01/02/2017   Right Outer Cheek(in Situ) (curet and 5FU)   SCC (squamous cell carcinoma) 06/23/2018   Left Sideburn(in Situ) (curet and 5FU)   SCC (squamous cell carcinoma) 06/23/2018   Left Upper Lip(Well Diff) (MOH's)   SCC (squamous cell carcinoma) 06/23/2018   Right Ear Rim(in Situ) (curet and 5FU)   Sleep apnea    cpap, no cpap since may 2018 weight loss 80 labs    Superficial nodular basal cell carcinoma (BCC) 06/27/2015   Left Rim of Ear (curet and 5FU)   Past Surgical History:  Procedure  Laterality Date   ANTERIOR CERVICAL DECOMP/DISCECTOMY FUSION Right 10/10/2018   Procedure: Right sided revision with hardware removal at Cervical three-four, Cervical four-five Anterior cervical decompression/discectomy/fusion with corpectomy at Cervical five;  Surgeon: Erline Levine, MD;  Location: [facility];  Service: Neurosurgery;  Laterality: Right;   CARDIAC CATHETERIZATION     patient reports he thinks he had one over 25 years ago   carpal tunnel Bilateral    CERVICAL VERTEBRAE EXCISION     and cleaning   CHOLECYSTECTOMY     cyst removed left hand  01-28-17   ESOPHAGOGASTRODUODENOSCOPY (EGD) WITH PROPOFOL N/A 01/31/2017   Procedure: ESOPHAGOGASTRODUODENOSCOPY (EGD) WITH PROPOFOL;  Surgeon: Alphonsa Overall, MD;  Location: [facility];  Service: General;  Laterality: N/A;   EYE SURGERY     bilateral cataract removal   GASTRIC ROUX-EN-Y N/A 06/04/2016   Procedure: LAPAROSCOPIC ROUX-EN-Y GASTRIC BYPASS WITH UPPER ENDOSCOPY;  Surgeon: Clovis Riley, MD;  Location: [facility];  Service: General;  Laterality: N/A;   NASAL SINUS SURGERY     POSTERIOR CERVICAL FUSION/FORAMINOTOMY N/A 06/08/2019   Procedure: Cervical Three to Cervical Seven Posterior cervical decompression and fusion;  Surgeon: Erline Levine, MD;  Location: [facility];  Service: Neurosurgery;  Laterality: N/A;  Cervical Three to Cervical Seven Posterior cervical decompression and fusion   RADIOLOGY WITH ANESTHESIA N/A 04/28/2019  Procedure: MRI RADIOLOGY WITH ANESTHESIA;  Surgeon: Radiologist, Medication, MD;  Location: [facility];  Service: Radiology;  Laterality: N/A;   TRIGGER FINGER RELEASE     Social History   Socioeconomic History   Marital status: Married    Spouse name: Not on file   Number of children: Not on file   Years of education: Not on file   Highest education level: Not on file  Occupational History   Not on file  Tobacco Use   Smoking status: Never   Smokeless tobacco: Never  Vaping Use   Vaping Use: Never used   Substance and Sexual Activity   Alcohol use: No   Drug use: No   Sexual activity: Yes  Other Topics Concern   Not on file  Social History Narrative   Not on file   Social Determinants of Health   Financial Resource Strain: Not on file  Food Insecurity: Not on file  Transportation Needs: Not on file  Physical Activity: Not on file  Stress: Not on file  Social Connections: Not on file   Outpatient Encounter Medications as of 08/01/2021  Medication Sig   acetaminophen (TYLENOL) 500 MG tablet Take 1,000 mg by mouth every 8 (eight) hours as needed for mild pain.   albuterol (PROAIR HFA) 108 (90 Base) MCG/ACT inhaler 2 puffs every 4 hours as needed only  if your can't catch your breath   amLODipine (NORVASC) 5 MG tablet Take 10 mg by mouth daily.   Calcium Carb-Cholecalciferol (CALCIUM 500+D3 PO) Take 1 tablet by mouth 3 (three) times daily.    calcium carbonate (TUMS - DOSED IN MG ELEMENTAL CALCIUM) 500 MG chewable tablet Chew 1-2 tablets by mouth 3 (three) times daily as needed for indigestion or heartburn.   Dulaglutide (TRULICITY) 2.20 UR/4.2HC SOPN Inject 0.75 mg into the skin once a week.   escitalopram (LEXAPRO) 20 MG tablet Take 20 mg by mouth daily.   gabapentin (NEURONTIN) 300 MG capsule Take 300 mg by mouth at bedtime.   Magnesium 400 MG CAPS Take 400 mg by mouth daily.    metoCLOPramide (REGLAN) 5 MG tablet Take 5 mg by mouth 3 (three) times daily.   Multiple Vitamin (MULTIVITAMIN WITH MINERALS) TABS tablet Take 1 tablet by mouth daily.   olmesartan (BENICAR) 40 MG tablet Take 40 mg by mouth daily.   Omalizumab (XOLAIR [place]) Inject 1 Dose into the skin every 14 (fourteen) days.   omeprazole (PRILOSEC) 20 MG capsule Take 20 mg by mouth every evening.   [DISCONTINUED] Dulaglutide (TRULICITY) 6.23 JS/2.8BT SOPN Inject 0.75 mg into the skin once a week. (Patient not taking: Reported on 08/01/2021)   No facility-administered encounter medications on file as of 08/01/2021.    ALLERGIES: No Known Allergies VACCINATION STATUS: Immunization History  Administered Date(s) Administered   Influenza, High Dose Seasonal PF 10/19/2017, 10/29/2018   Influenza-Unspecified 10/22/2017   Tdap 12/23/2017   Zoster Recombinat (Shingrix) 12/23/2017    Diabetes He presents for his follow-up diabetic visit. He has type 2 diabetes mellitus. Onset time: He was diagnosed at approximate age of 41 years. His disease course has been stable. There are no hypoglycemic associated symptoms. Pertinent negatives for hypoglycemia include no confusion, headaches, pallor or seizures. Pertinent negatives for diabetes include no chest pain, no polydipsia, no polyphagia, no polyuria and no weakness. There are no hypoglycemic complications. Symptoms are stable. Diabetic complications include nephropathy. Risk factors for coronary artery disease include diabetes mellitus, dyslipidemia, hypertension and male sex. His weight is decreasing  steadily (He is status post Roux-en-Y gastric bypass-after successfully losing 123 pounds, he has regained approximately 20 pounds since last visit.  ). He is following a diabetic diet. When asked about meal planning, he reported none. He has not had a previous visit with a dietitian. He participates in exercise intermittently (He participates in golfing activities.). His home blood glucose trend is decreasing steadily. Allen [name] presents with A1c of 6.3%, higher than his last visit A1c of 5.3%. Patient is back to he stopped his Trulicity based off of unintended weight loss he experienced about a month ago.   Compared to his last visit weight, he has gained 9 pounds.  Overall he is feeling around 130 pounds lower than he is presurgery weight.  He is status post Roux-en-Y surgery. ) An ACE inhibitor/angiotensin II receptor blocker is being taken. Eye exam is current.  Hyperlipidemia This is a chronic problem. The current episode started more than 1 year ago. The problem is  uncontrolled. Exacerbating diseases include chronic renal disease and diabetes. He has no history of obesity. Pertinent negatives include no chest pain, myalgias or shortness of breath. Current antihyperlipidemic treatment includes statins and bile acid squestrants. Compliance problems include adherence to diet.  Risk factors for coronary artery disease include diabetes mellitus, dyslipidemia, hypertension, male sex, obesity and a sedentary lifestyle.  Hypertension This is a chronic problem. The current episode started more than 1 year ago. The problem is uncontrolled. Pertinent negatives include no chest pain, headaches, neck pain, palpitations or shortness of breath. Risk factors for coronary artery disease include diabetes mellitus, dyslipidemia and sedentary lifestyle. Past treatments include angiotensin blockers. Hypertensive end-organ damage includes kidney disease. Identifiable causes of hypertension include chronic renal disease.     Review of Systems  Constitutional:  Negative for chills, fever and unexpected weight change.  HENT:  Negative for dental problem, mouth sores and trouble swallowing.   Eyes:  Negative for visual disturbance.  Respiratory:  Negative for cough, choking, chest tightness, shortness of breath and wheezing.   Cardiovascular:  Negative for chest pain, palpitations and leg swelling.  Gastrointestinal:  Negative for abdominal distention, abdominal pain, constipation, diarrhea, nausea and vomiting.  Endocrine: Negative for polydipsia, polyphagia and polyuria.  Genitourinary:  Negative for dysuria, flank pain, hematuria and urgency.  Musculoskeletal:  Negative for back pain, gait problem, myalgias and neck pain.  Skin:  Negative for pallor, rash and wound.  Neurological:  Negative for seizures, syncope, weakness, numbness and headaches.  Psychiatric/Behavioral:  Negative for confusion and dysphoric mood.     Objective:    BP (!) 150/82   Pulse (!) 56   Ht $R'5\' 11"'qG$   (1.803 m)   Wt 177 lb 9.6 oz (80.6 kg)   BMI 24.77 kg/m   Wt Readings from Last 3 Encounters:  08/01/21 177 lb 9.6 oz (80.6 kg)  01/31/21 172 lb 1.3 oz (78.1 kg)  01/31/21 169 lb 9.6 oz (76.9 kg)     Recent Results (from the past 2160 hour(s))  Comprehensive metabolic panel     Status: Abnormal   Collection Time: 07/27/21 10:24 AM  Result Value Ref Range   Glucose 152 (H) 70 - 99 mg/dL   BUN 42 (H) 8 - 27 mg/dL   Creatinine, Ser 3.66 (H) 0.76 - 1.27 mg/dL   eGFR 17 (L) >59 mL/min/1.73   BUN/Creatinine Ratio 11 10 - 24   Sodium 140 134 - 144 mmol/L   Potassium 4.4 3.5 - 5.2 mmol/L   Chloride 104 96 -  106 mmol/L   CO2 20 20 - 29 mmol/L   Calcium 9.1 8.6 - 10.2 mg/dL   Total Protein 6.7 6.0 - 8.5 g/dL   Albumin 4.5 3.7 - 4.7 g/dL    Comment:                **Effective July 31, 2021 Albumin reference interval**                  will be changing to:                             Age                  Male          Male                            0 -   7 days       3.6 - 4.9      3.6 - 4.9                            8 -  30 days       3.5 - 4.6      3.5 - 4.6                            1 -   6 months     3.7 - 4.8      3.7 - 4.8                     7 months -   2 years      4.0 - 5.0      4.0 - 5.0                            3 -   5 years      4.1 - 5.0      4.1 - 5.0                            6 -  12 years      4.2 - 5.0      4.2 - 5.0                           13 -  30 years      4.3 - 5.2      4.0 - 5.0                           31 -  50 years      4.1 - 5.1      3.9 - 4.9                           51 -  60 years      3.8 - 4.9      3.8 - 4.9                           61 -  70 years  3.9 - 4.9      3.9 - 4.9                           71 -  80 years      3.8 - 4.8      3.8 - 4._0 -  89 years      3.7 - 4.7      3.7 - 4.7                           90 - 199 years      3.6 - 4.6      3.6 - 4.6    Globulin, Total 2.2 1.5 - 4.5 g/dL    Albumin/Globulin Ratio 2.0 1.2 - 2.2   Bilirubin Total 0.6 0.0 - 1.2 mg/dL   Alkaline Phosphatase 76 44 - 121 IU/L   AST 20 0 - 40 IU/L   ALT 18 0 - 44 IU/L  Lipid panel     Status: None   Collection Time: 07/27/21 10:24 AM  Result Value Ref Range   Cholesterol, Total 175 100 - 199 mg/dL   Triglycerides 144 0 - 149 mg/dL   HDL 56 >39 mg/dL   VLDL Cholesterol Cal 25 5 - 40 mg/dL   LDL Chol Calc (NIH) 94 0 - 99 mg/dL   Chol/HDL Ratio 3.1 0.0 - 5.0 ratio    Comment:                                   T. Chol/HDL Ratio                                             Men  Women                               1/2 Avg.Risk  3.4    3.3                                   Avg.Risk  5.0    4.4                                2X Avg.Risk  9.6    7.1                                3X Avg.Risk 23.4   11.0   HgB A1c     Status: None   Collection Time: 08/01/21  9:39 AM  Result Value Ref Range   Hemoglobin A1C     HbA1c POC (<> result, manual entry)     HbA1c, POC (prediabetic range)     HbA1c, POC (controlled diabetic range) 6.3 0.0 - 7.0 %   Lipid Panel     Component Value Date/Time   CHOL 175 07/27/2021 1024  TRIG 144 07/27/2021 1024   HDL 56 07/27/2021 1024   CHOLHDL 3.1 07/27/2021 1024   CHOLHDL 3.9 04/25/2016 1119   VLDL 27 04/25/2016 1119   LDLCALC 94 07/27/2021 1024     Assessment & Plan:   1.  type 2 diabetes mellitus  - Patient has currently controlled asymptomatic type 2 DM since  71 years of age.  Patient is status post Roux-en-Y gastric bypass.  Allen [name] presents with A1c of 6.3%, higher than his last visit A1c of 5.3%. Patient is back to he stopped his Trulicity based off of unintended weight loss he experienced about a month ago.   He is returning with fluctuating body weight.  He is largely maintained 130 pounds of weight loss he achieved secondary to his Roux-en-Y surgery.     - His diabetes is complicated by mild  CKD  and patient remains at a high risk for more acute  and chronic complications of diabetes which include CAD, CVA, CKD, retinopathy, and neuropathy. These are all discussed in detail with the patient.  - I have counseled the patient on diet management and weight loss, by adopting a carbohydrate restricted/protein rich diet.  - he acknowledges that there is a room for improvement in his food and drink choices. - Suggestion is made for him to avoid simple carbohydrates  from his diet including Cakes, Sweet Desserts, Ice Cream, Soda (diet and regular), Sweet Tea, Candies, Chips, Cookies, Store Bought Juices, Alcohol , Artificial Sweeteners,  Coffee Creamer, and "Sugar-free" Products, Lemonade. This will help patient to have more stable blood glucose profile and potentially avoid unintended weight gain.  The following Lifestyle Medicine recommendations according to [facility]) were discussed and and offered to patient and he  agrees to start the journey:  A. Whole Foods, Plant-Based Nutrition comprising of fruits and vegetables, plant-based proteins, whole-grain carbohydrates was discussed in detail with the patient.   A list for source of those nutrients were also provided to the patient.  Patient will use only water or unsweetened tea for hydration. B.  The need to stay away from risky substances including alcohol, smoking; obtaining 7 to 9 hours of restorative sleep, at least 150 minutes of moderate intensity exercise weekly, the importance of healthy social connections,  and stress management techniques were discussed. C.  A full color page of  Calorie density of various food groups per pound showing examples of each food groups was provided to the patient.   - I encouraged the patient to switch to  unprocessed or minimally processed complex starch and increased protein intake (animal or plant source), fruits, and vegetables.  - Patient is advised to stick to a routine mealtimes to eat 3 meals  a day and avoid  unnecessary snacks ( to snack only to correct hypoglycemia).   - I have approached patient with the following individualized plan to manage diabetes and patient agrees:   -He has previously benefited from reintroduction of GLP-1 receptor agonist.  -In light of his presentation with weight gain and rising A1c, he will benefit from resumption of low-dose Trulicity.  I discussed and reviewed he is on Trulicity 6.72 mg subcutaneously weekly.  - Patient specific target  A1c;  LDL, HDL, Triglycerides,  were discussed in detail.  2) BP/HTN: -His blood pressure is not controlled to target.  He remains on amlodipine 10 mg p.o. daily, Benicar 40 mg p.o. daily.  Whole food plant-based diet discussed above will help with blood  pressure.  He wishes to address his blood pressure management with his nephrologist.    3) Lipids/HPL: Recent lipid panel showed increased LDL at 123 from 89.  He was approached for intervention with a statin, patient would like to discuss this with his PMD.       4)  Weight/Diet: Status post Roux-en-Y gastric bypass, successfully lost up to 130 pounds.   His current weight is optimal for him.  CDE Consult in progress , exercise, and detailed carbohydrates information provided.  5) Chronic Care/Health Maintenance:  -Patient is on ACEI/ARB and Statin medications and encouraged to continue to follow up with Ophthalmology, Podiatrist at least yearly or according to recommendations, and advised to   stay away from smoking. I have recommended yearly flu vaccine and pneumonia vaccination at least every 5 years; moderate intensity exercise for up to 150 minutes weekly; and  sleep for at least 7 hours a day. -He is advised to get his colonoscopy through GI.  His constipation is likely due to his pain medications.   His screening ABI was negative for PAD in March 2022.  This study will be repeated in March 2027, or sooner if needed.  - I advised patient to maintain close follow up with  Vasireddy, Lanetta Inch, MD for primary care needs.   I spent 41 minutes in the care of the patient today including review of labs from [employer], Lipids, Thyroid Function, Hematology (current and previous including abstractions from other facilities); face-to-face time discussing  his blood glucose readings/logs, discussing hypoglycemia and hyperglycemia episodes and symptoms, medications doses, his options of short and long term treatment based on the latest standards of care / guidelines;  discussion about incorporating lifestyle medicine;  and documenting the encounter. Risk reduction counseling performed per USPSTF guidelines to reduce obesity and cardiovascular risk factors.     Please refer to Patient Instructions for Blood Glucose Monitoring and Insulin/Medications Dosing Guide"  in media tab for additional information. Please  also refer to " Patient Self Inventory" in the Media  tab for reviewed elements of pertinent patient history.  Allen [name] participated in the discussions, expressed understanding, and voiced agreement with the above plans.  All questions were answered to his satisfaction. he is encouraged to contact clinic should he have any questions or concerns prior to his return visit.   Follow up plan: In 6 months with labs. Glade Lloyd, MD Phone: [phone]  Fax: [phone]  -  This note was partially dictated with voice recognition software. Similar sounding words can be transcribed inadequately or may not  be corrected upon review.  08/01/2021, 3:54 PM

## 2021-11-30 ENCOUNTER — Telehealth: Payer: Self-pay | Admitting: Internal Medicine

## 2021-11-30 NOTE — Telephone Encounter (Signed)
Add to schedule 11/10 pm and bring all meds

## 2021-11-30 NOTE — Telephone Encounter (Signed)
Primary Pulmonologist: Dr. [name]  Last office visit and with whom: Dr. [name] 01/31/2021 What do we see them for (pulmonary problems): Chronic asthma  Last OV assessment/plan: see below   Was appointment offered to patient (explain)?  No availability in [facility] office until Jan 2024    Reason for call: Patients wife called and states that patient saw ENT doctor approx. A week ago and he had an ear infection, fluid in ears and a prod. Cough with yellow and brown sputum. Patient took an antibiotic and states he is still having the cough. Denies wheezing, fever. States he had a CXR, flu, strep and covid test and all were negative. Is still taking tessalon perles 200 mg tid prn. Wants to know if we can send something in for productive cough.   Please advise.   No Known Allergies  Immunization History  Administered Date(s) Administered   Influenza, High Dose Seasonal PF 10/19/2017, 10/29/2018   Influenza-Unspecified 10/22/2017   Tdap 12/23/2017   Zoster Recombinat (Shingrix) 12/23/2017     Assessment                      Assessment & Plan Note by Tanda Rockers, MD at 01/31/2021 11:44 AM  Author: Tanda Rockers, MD Author Type: Physician Filed: 01/31/2021 11:48 AM  Note Status: Written Cosign: Cosign Not Required Encounter Date: 01/31/2021  Problem: Chronic asthma, moderate persistent, uncomplicated  Editor: Tanda Rockers, MD (Physician)             Never smoker with onset allergy/sinus dz in childhood and asthma around age 71 - Xolair rx per Luan Pulling started around 2010 marked improvement in asthma > sinus dz on no maint rx as of 1st [facility] pulmonary eval 11/11/2019    All goals of chronic asthma control met including optimal function and elimination of symptoms with minimal need for rescue therapy on maint rx for xolair.   Contingencies discussed in full including contacting this office immediately if not controlling the symptoms using the rule of two's.      Will inquire as to  whether monthly injections can be considered at this point           Each maintenance medication was reviewed in detail including emphasizing most importantly the difference between maintenance and prns and under what circumstances the prns are to be triggered using an action plan format where appropriate.   Total time for H and P, chart review, counseling, reviewing hfa  device(s) and generating customized AVS unique to this office visit / same day charting = 22 min              Patient Instructions by Tanda Rockers, MD at 01/31/2021 11:15 AM  Author: Tanda Rockers, MD Author Type: Physician Filed: 01/31/2021 11:42 AM  Note Status: Addendum Cosign: Cosign Not Required Encounter Date: 01/31/2021  Editor: Tanda Rockers, MD (Physician)      Prior Versions: 1. Tanda Rockers, MD (Physician) at 01/31/2021 11:41 AM - Signed  No change in your xolair treatment plan- I will inquire of the xolair representative whether we could consider extending you to once a month at this point    Please schedule a follow up visit in 12  months but call sooner if needed

## 2021-11-30 NOTE — Telephone Encounter (Signed)
Added patient to schedule 12/01/2021 1:45 pm. Aware to bring all meds. Nothing further needed

## 2021-12-01 ENCOUNTER — Other Ambulatory Visit: Payer: Self-pay | Admitting: Internal Medicine

## 2021-12-01 ENCOUNTER — Ambulatory Visit (INDEPENDENT_AMBULATORY_CARE_PROVIDER_SITE_OTHER): Payer: Medicare Other | Admitting: Internal Medicine

## 2021-12-01 ENCOUNTER — Encounter: Payer: Self-pay | Admitting: Internal Medicine

## 2021-12-01 VITALS — BP 128/82 | HR 70 | Temp 98.2°F | Ht 71.0 in | Wt 177.6 lb

## 2021-12-01 DIAGNOSIS — J4540 Moderate persistent asthma, uncomplicated: Secondary | ICD-10-CM

## 2021-12-01 LAB — POCT EXHALED NITRIC OXIDE: FeNO level (ppb): 85

## 2021-12-01 MED ORDER — PANTOPRAZOLE SODIUM 40 MG PO TBEC: 40.0000 mg | DELAYED_RELEASE_TABLET | Freq: Every day | ORAL | 2 refills | Status: AC

## 2021-12-01 MED ORDER — MOMETASONE FURO-FORMOTEROL FUM 100-5 MCG/ACT IN AERO: 2.0000 | INHALATION_SPRAY | Freq: Two times a day (BID) | RESPIRATORY_TRACT | 0 refills | Status: DC

## 2021-12-01 MED ORDER — PREDNISONE 10 MG PO TABS: ORAL_TABLET | ORAL | 0 refills | Status: DC

## 2021-12-01 MED ORDER — FAMOTIDINE 20 MG PO TABS: ORAL_TABLET | ORAL | 11 refills | Status: AC

## 2021-12-01 MED ORDER — CEFDINIR 300 MG PO CAPS: 300.0000 mg | ORAL_CAPSULE | Freq: Two times a day (BID) | ORAL | 1 refills | Status: DC

## 2021-12-01 MED ORDER — BUDESONIDE-FORMOTEROL FUMARATE 80-4.5 MCG/ACT IN AERO: INHALATION_SPRAY | RESPIRATORY_TRACT | 12 refills | Status: DC

## 2021-12-01 NOTE — Progress Notes (Signed)
Called [facility] tech is changing rx to name brand with copay of $47 dollars symbicort and will not be in until Monday. Called and notified patient.

## 2021-12-01 NOTE — Assessment & Plan Note (Signed)
Never smoker with onset allergy/sinus dz in childhood and asthma around age 70 - Xolair rx per Luan Pulling started around 2010 marked improvement in asthma > sinus dz on no maint rx as of 1st [facility] pulmonary eval 11/11/2019  - 02/02/2021 rec q month xolair p discussed with Dr Ernst Bowler flared 09/2021 with severe rhinitis/sinusitis > ent eval rx x 3 > no better 12/01/2021  - 12/01/2021  After extensive coaching inhaler device,  effectiveness =    90% so rx symbicort 80 2bid and pred x 8 / ominicef 300 mg x  10 - 20 d - refer to Dr Ernst Bowler 12/01/2021 >>>  - Allergy screen 12/01/2021 >  Eos 0. /  IgE   - FENO 12/01/2021  = 85 p last xolair 11/22/21   DDX of  difficult airways management almost all start with A and  include Adherence, Ace Inhibitors, Acid Reflux, Active Sinus Disease, Alpha 1 Antitripsin deficiency, Anxiety masquerading as Airways dz,  ABPA,  Allergy(esp in young), Aspiration (esp in elderly), Adverse effects of meds,  Active smoking or vaping, A bunch of PE's (a small clot burden can't cause this syndrome unless there is already severe underlying pulm or vascular dz with poor reserve) plus two Bs  = Bronchiectasis and Beta blocker use..and one C= CHF  Adherence is always the initial "prime suspect" and is a multilayered concern that requires a "trust but verify" approach in every patient - starting with knowing how to use medications, especially inhalers, correctly, keeping up with refills and understanding the fundamental difference between maintenance and prns vs those medications only taken for a very short course and then stopped and not refilled.  - see hfa teaching   Allergy /asthma  check allergy screen, FENO = 85 c/w allergy component despite xolair 10 d prior to ov so likely needs alternative biologic at this point as note he has continued to have rhintis sinusitis issues  on full dose xolair x years > refer to Allergy and in meantime rx symb 80 2bid and Prednisone 10 mg Take 4 for  two days three for two days two for two days one for two days   Active sinusitis > omnicef 300 mg bid x 10 and stretch to 20 if not all better p 10  ? Acid (or non-acid) GERD > always difficult to exclude as up to 75% of pts in some series report no assoc GI/ Heartburn symptoms> rec max (24h)  acid suppression and diet restrictions/ reviewed and instructions given in writing.   F/u with allergy and in this clinic prn          Each maintenance medication was reviewed in detail including emphasizing most importantly the difference between maintenance and prns and under what circumstances the prns are to be triggered using an action plan format where appropriate.  Total time for H and P, chart review, counseling, reviewing hfa device(s) and generating customized AVS unique to this office visit / same day charting  > 40 min acute office visit for refactory symptoms x 2 months

## 2021-12-01 NOTE — Patient Instructions (Addendum)
Plan A = Automatic = Always=   Symbicort 80 Take 2 puffs first thing in am and then another 2 puffs about 12 hours later.   Work on inhaler technique:  relax and gently blow all the way out then take a nice smooth full deep breath back in, triggering the inhaler at same time you start breathing in.  Hold breath in for at least  5 seconds if you can. Blow out symbicort  thru nose. Rinse and gargle with water when done.  If mouth or throat bother you at all,  try brushing teeth/gums/tongue with arm and hammer toothpaste/ make a slurry and gargle and spit out.       Plan B = Backup (to supplement plan A, not to replace it) Only use your albuterol inhaler as a rescue medication to be used if you can't catch your breath by resting or doing a relaxed purse lip breathing pattern.  - The less you use it, the better it will work when you need it. - Ok to use the inhaler up to 2 puffs  every 4 hours if you must but call for appointment if use goes up over your usual need - Don't leave home without it !!  (think of it like the spare tire for your car)   For cough > mucinex dm 1200 mg every 12 hours and add on the hydrocodone as needed     Prednisone 10 mg Take 4 for two days three for two days two for two days one for two days   Omnicef 300 mg twice daily for 10-20 days   Pantoprazole (protonix) 40 mg   Take  30-60 min before first meal of the day and Pepcid (famotidine)  20 mg after supper until cough gone x 2 weeks without the need for cough meds then resume prior prilosec  GERD (REFLUX)  is an extremely common cause of respiratory symptoms just like yours , many times with no obvious heartburn at all.    It can be treated with medication, but also with lifestyle changes including elevation of the head of your bed (ideally with 6 -8inch blocks under the headboard of your bed),  Smoking cessation, avoidance of late meals, excessive alcohol, and avoid fatty foods, chocolate, peppermint, colas, red wine,  and acidic juices such as orange juice.  NO MINT OR MENTHOL PRODUCTS SO NO COUGH DROPS  USE SUGARLESS CANDY INSTEAD (Jolley ranchers or Stover's or Life Savers) or even ice chips will also do - the key is to swallow to prevent all throat clearing. NO OIL BASED VITAMINS - use powdered substitutes.  Avoid fish oil when coughing.

## 2021-12-01 NOTE — Progress Notes (Signed)
Allen [name], male    DOB: 25-Oct-1950,    MRN: [id number]   Brief patient profile:  67 yowm former pt of Dr Luan Pulling never smoker allergies/sinus problems all his life and then asthma around the age of 55 and difficult to control on multiple maint rx  but big change w/in the first year started xolair ? Around 2010 but still bothered by freq ear /sinus infections f/b Dr Herbie Saxon but rarely if ever using any saba and no maint rx at all so self referred back to [facility] clinic to renew xolair    S/p gastric bypass in 2018 lost almost 100 lbs made a big difference no longer DM    History of Present Illness  11/11/2019  Pulmonary/ 1st office eval/ [name] / [place] [facility]  Chief Complaint  Patient presents with   Pulmonary Consult    Former patient of Dr Luan Pulling. He states his breathing is doing well today.  He is requesting [name] to sign form in order for him to continue Xolair at infusion clinic in [place].   Dyspnea:  Not limited by breathing from desired activities  / regular walking including including hills Cough: minimal assocs pnds  Sleep: 30 degrees electric bed  SABA use: rarely Rec Continue your medications including xolair. If you additional support for Xolair that we can't provide thru this office, I recommend you see Dr Ernst Bowler with [facility] in [place] office. Only use your albuterol as a rescue medication to be used if you can't catch your breath Please schedule a follow up visit in 12 months but call sooner if needed     01/31/2021  f/u ov/[place] [facility]/Edwen Mclester re: asthma maint on xolair   Chief Complaint  Patient presents with   Follow-up    Breathing is continuing to improve.   Dyspnea:  walking including hills 30 min 3 -4 x per week  Cough: none Sleeping: no resp cc  SABA use: keeps it handy never uses  02: none Covid status: vax all including bivalent Lung cancer screening: never  Rec No change in your xolair treatment plan- I will inquire of  the xolair representative whether we could consider extending you to once a month at this point  Please schedule a follow up visit in 12  months but call sooner if needed     12/01/2021 Acute  ov/ office/[name] re: sinusitis/ bronchitis maint on xolair   Chief Complaint  Patient presents with   Acute Visit    From phone note: Patients wife called and states that patient saw ENT doctor approx. A week ago and he had an ear infection, fluid in ears and a prod. Cough with yellow and brown sputum. Patient took an antibiotic and states he is still having the cough. Denies wheezing, fever. States he had a CXR, flu, strep and covid test and all were negative. Is still taking tessalon perles 200 mg tid prn.  Dyspnea:  no doe  Cough: mucus is brown p augentin/ prednisone x 5 days  then clindamycin by ent then augemtin again  Sleeping: lots of cough at hs disturbing steep and worse in am  SABA use: still not using  02: none  Covid status: vax x 6      No obvious day to day or daytime variability or assoc  mucus plugs or hemoptysis or cp or chest tightness, subjective wheeze or overt sinus or hb symptoms.    . Also denies any obvious fluctuation of symptoms with weather or environmental  changes or other aggravating or alleviating factors except as outlined above   No unusual exposure hx or h/o childhood pna/ asthma or knowledge of premature birth.  Current Allergies, Complete Past Medical History, Past Surgical History, Family History, and Social History were reviewed in [employer] record.  ROS  The following are not active complaints unless bolded Hoarseness, sore throat, dysphagia, dental problems, itching, sneezing,  nasal congestion or discharge of excess mucus or purulent secretions, ear ache,   fever, chills, sweats, unintended wt loss or wt gain, classically pleuritic or exertional cp,  orthopnea pnd or arm/hand swelling  or leg swelling, presyncope,  palpitations, abdominal pain, anorexia, nausea, vomiting, diarrhea  or change in bowel habits or change in bladder habits, change in stools or change in urine, dysuria, hematuria,  rash, arthralgias, visual complaints, headache, numbness, weakness or ataxia or problems with walking or coordination,  change in mood or  memory.        Current Meds  Medication Sig   acetaminophen (TYLENOL) 500 MG tablet Take 1,000 mg by mouth every 8 (eight) hours as needed for mild pain.   albuterol (PROAIR HFA) 108 (90 Base) MCG/ACT inhaler 2 puffs every 4 hours as needed only  if your can't catch your breath   amLODipine (NORVASC) 5 MG tablet Take 10 mg by mouth daily.   Calcium Carb-Cholecalciferol (CALCIUM 500+D3 PO) Take 1 tablet by mouth 3 (three) times daily.    calcium carbonate (TUMS - DOSED IN MG ELEMENTAL CALCIUM) 500 MG chewable tablet Chew 1-2 tablets by mouth 3 (three) times daily as needed for indigestion or heartburn.   Dulaglutide (TRULICITY) 8.75 IE/3.3IR SOPN Inject 0.75 mg into the skin once a week.   escitalopram (LEXAPRO) 20 MG tablet Take 20 mg by mouth daily.   gabapentin (NEURONTIN) 300 MG capsule Take 300 mg by mouth at bedtime.   Magnesium 400 MG CAPS Take 400 mg by mouth daily.    Multiple Vitamin (MULTIVITAMIN WITH MINERALS) TABS tablet Take 1 tablet by mouth daily.   olmesartan (BENICAR) 40 MG tablet Take 40 mg by mouth daily.   Omalizumab (XOLAIR [place]) Inject 1 Dose into the skin every 14 (fourteen) days.   omeprazole (PRILOSEC) 20 MG capsule Take 20 mg by mouth every evening.           Past Medical History:  Diagnosis Date   Anemia    on iron since gastric bypass   Arthritis    mild   Asthma    Chronic kidney disease    stage 3, now resolved since weight loss   Claustrophobia    Diabetes mellitus, type II (HCC)    type 2 metformin   GERD (gastroesophageal reflux disease)    takes nexium   Hyperlipidemia    Hypertension    Pneumonia 15 yrs ago   SCC (squamous cell  carcinoma) 12/29/2007   Left Mid Preauricular(Mod.Diff) (Dr. Lacinda Axon)   SCC (squamous cell carcinoma) 12/29/2007   Right Upper Dorsal Nose(Well Diff) (Dr. Lacinda Axon)   SCC (squamous cell carcinoma) 12/29/2007   Anti-Tragus Right Ear(Mod.Diff) (Dr. Lacinda Axon)   SCC (squamous cell carcinoma) 06/06/2010   Left Nose(in Situ) (curet and 5FU)   SCC (squamous cell carcinoma) 06/06/2010   Right Check(Well Diff) (curet and 5FU)   SCC (squamous cell carcinoma) 07/20/2013   Right Sideburn(KA) (curet and 5FU)   SCC (squamous cell carcinoma) 06/27/2015   Right Neck Sup(in Situ) (curet and 5FU)   SCC (squamous cell carcinoma) 06/27/2015  Right Neck Inf(in Situ) (curet and 5FU)   SCC (squamous cell carcinoma) 01/02/2017   Left Wrist(in Situ) (curet and 5FU)   SCC (squamous cell carcinoma) 01/02/2017   Right Outer Cheek(in Situ) (curet and 5FU)   SCC (squamous cell carcinoma) 06/23/2018   Left Sideburn(in Situ) (curet and 5FU)   SCC (squamous cell carcinoma) 06/23/2018   Left Upper Lip(Well Diff) (MOH's)   SCC (squamous cell carcinoma) 06/23/2018   Right Ear Rim(in Situ) (curet and 5FU)   Sleep apnea    cpap, no cpap since may 2018 weight loss 80 labs    Superficial nodular basal cell carcinoma (BCC) 06/27/2015   Left Rim of Ear (curet and 5FU)         Objective:      12/01/2021    177  01/31/21 172 lb 1.3 oz (78.1 kg)  01/31/21 169 lb 9.6 oz (76.9 kg)  06/29/20 177 lb 9.6 oz (80.6 kg)    Vital signs reviewed  12/01/2021  - Note at rest 02 sats  96% on RA   General appearance:    amb wm nasal tone to voice   HEENT : Oropharynx  clear     Nasal turbinates mod edema/ no polyps or purulent secretions    NECK :  without  apparent JVD/ palpable Nodes/TM    LUNGS: no acc muscle use,  Nl contour chest junky upper airway and lower airway coarse rhonchi bilaterally without cough on insp or exp maneuvers   CV:  RRR  no s3 or murmur or increase in P2, and no edema   ABD:  soft and nontender with  nl inspiratory excursion in the supine position. No bruits or organomegaly appreciated   MS:  Nl gait/ ext warm without deformities Or obvious joint restrictions  calf tenderness, cyanosis or clubbing    SKIN: warm and dry without lesions    NEURO:  alert, approp, nl sensorium with  no motor or cerebellar deficits apparent.      Cxr  reported ok 2 weeks prior to OV           Assessment

## 2021-12-04 ENCOUNTER — Telehealth: Payer: Self-pay | Admitting: Internal Medicine

## 2021-12-04 NOTE — Telephone Encounter (Signed)
Not covered by patients insurance

## 2021-12-04 NOTE — Telephone Encounter (Signed)
Called and spoke to pharmacist at [facility] and he states he was giving patient price for dulera inhaler. Asked him to cancel that inhaler and provide patient with generic symbicort we discussed Friday. He voiced understanding and confirms patient will have $47 co pay for inhaler.   Called and notified patient. He voiced understanding. Nothing further needed at this time.

## 2021-12-05 ENCOUNTER — Ambulatory Visit (INDEPENDENT_AMBULATORY_CARE_PROVIDER_SITE_OTHER): Payer: Medicare Other | Admitting: "Endocrinology

## 2021-12-05 ENCOUNTER — Encounter: Payer: Self-pay | Admitting: "Endocrinology

## 2021-12-05 VITALS — BP 142/82 | HR 72 | Ht 71.0 in | Wt 175.4 lb

## 2021-12-05 DIAGNOSIS — E1165 Type 2 diabetes mellitus with hyperglycemia: Secondary | ICD-10-CM

## 2021-12-05 LAB — POCT GLYCOSYLATED HEMOGLOBIN (HGB A1C): HbA1c, POC (controlled diabetic range): 6.8 % (ref 0.0–7.0)

## 2021-12-05 LAB — CBC WITH DIFFERENTIAL/PLATELET
Basophils Absolute: 0.1 10*3/uL (ref 0.0–0.2)
Basos: 1 %
EOS (ABSOLUTE): 0.2 10*3/uL (ref 0.0–0.4)
Eos: 3 %
Hematocrit: 33.9 % — ABNORMAL LOW (ref 37.5–51.0)
Hemoglobin: 11.6 g/dL — ABNORMAL LOW (ref 13.0–17.7)
Immature Grans (Abs): 0 10*3/uL (ref 0.0–0.1)
Immature Granulocytes: 0 %
Lymphocytes Absolute: 2.7 10*3/uL (ref 0.7–3.1)
Lymphs: 36 %
MCH: 29.8 pg (ref 26.6–33.0)
MCHC: 34.2 g/dL (ref 31.5–35.7)
MCV: 87 fL (ref 79–97)
Monocytes Absolute: 0.5 10*3/uL (ref 0.1–0.9)
Monocytes: 6 %
Neutrophils Absolute: 4.1 10*3/uL (ref 1.4–7.0)
Neutrophils: 54 %
Platelets: 213 10*3/uL (ref 150–450)
RBC: 3.89 x10E6/uL — ABNORMAL LOW (ref 4.14–5.80)
RDW: 12.3 % (ref 11.6–15.4)
WBC: 7.5 10*3/uL (ref 3.4–10.8)

## 2021-12-05 LAB — IGE: IgE (Immunoglobulin E), Serum: 117 IU/mL (ref 6–495)

## 2021-12-05 MED ORDER — TRULICITY 0.75 MG/0.5ML ~~LOC~~ SOAJ: 0.7500 mg | SUBCUTANEOUS | 1 refills | Status: DC

## 2021-12-05 NOTE — Progress Notes (Signed)
12/05/2021   Endocrinology follow-up note   Subjective:    Patient ID: Allen [name], male    DOB: 1950-02-28. Patient is being seen in follow-up for the management of type 2 diabetes.    Vasireddy, Lanetta Inch, MD  Past Medical History:  Diagnosis Date   Anemia    on iron since gastric bypass   Arthritis    mild   Asthma    Chronic kidney disease    stage 3, now resolved since weight loss   Claustrophobia    Diabetes mellitus, type II ([name])    type 2 metformin   GERD (gastroesophageal reflux disease)    takes nexium   Hyperlipidemia    Hypertension    Pneumonia 15 yrs ago   SCC (squamous cell carcinoma) 12/29/2007   Left Mid Preauricular(Mod.Diff) (Dr. Lacinda Axon)   SCC (squamous cell carcinoma) 12/29/2007   Right Upper Dorsal Nose(Well Diff) (Dr. Lacinda Axon)   SCC (squamous cell carcinoma) 12/29/2007   Anti-Tragus Right Ear(Mod.Diff) (Dr. Lacinda Axon)   SCC (squamous cell carcinoma) 06/06/2010   Left Nose(in Situ) (curet and 5FU)   SCC (squamous cell carcinoma) 06/06/2010   Right Check(Well Diff) (curet and 5FU)   SCC (squamous cell carcinoma) 07/20/2013   Right Sideburn(KA) (curet and 5FU)   SCC (squamous cell carcinoma) 06/27/2015   Right Neck Sup(in Situ) (curet and 5FU)   SCC (squamous cell carcinoma) 06/27/2015   Right Neck Inf(in Situ) (curet and 5FU)   SCC (squamous cell carcinoma) 01/02/2017   Left Wrist(in Situ) (curet and 5FU)   SCC (squamous cell carcinoma) 01/02/2017   Right Outer Cheek(in Situ) (curet and 5FU)   SCC (squamous cell carcinoma) 06/23/2018   Left Sideburn(in Situ) (curet and 5FU)   SCC (squamous cell carcinoma) 06/23/2018   Left Upper Lip(Well Diff) (MOH's)   SCC (squamous cell carcinoma) 06/23/2018   Right Ear Rim(in Situ) (curet and 5FU)   Sleep apnea    cpap, no cpap since may 2018 weight loss 80 labs    Superficial nodular basal cell carcinoma (BCC) 06/27/2015   Left Rim of Ear (curet and 5FU)   Past Surgical History:  Procedure  Laterality Date   ANTERIOR CERVICAL DECOMP/DISCECTOMY FUSION Right 10/10/2018   Procedure: Right sided revision with hardware removal at Cervical three-four, Cervical four-five Anterior cervical decompression/discectomy/fusion with corpectomy at Cervical five;  Surgeon: Erline Levine, MD;  Location: [facility];  Service: Neurosurgery;  Laterality: Right;   CARDIAC CATHETERIZATION     patient reports he thinks he had one over 25 years ago   carpal tunnel Bilateral    CERVICAL VERTEBRAE EXCISION     and cleaning   CHOLECYSTECTOMY     cyst removed left hand  01-28-17   ESOPHAGOGASTRODUODENOSCOPY (EGD) WITH PROPOFOL N/A 01/31/2017   Procedure: ESOPHAGOGASTRODUODENOSCOPY (EGD) WITH PROPOFOL;  Surgeon: Alphonsa Overall, MD;  Location: [facility];  Service: General;  Laterality: N/A;   EYE SURGERY     bilateral cataract removal   GASTRIC ROUX-EN-Y N/A 06/04/2016   Procedure: LAPAROSCOPIC ROUX-EN-Y GASTRIC BYPASS WITH UPPER ENDOSCOPY;  Surgeon: Clovis Riley, MD;  Location: [facility];  Service: General;  Laterality: N/A;   NASAL SINUS SURGERY     POSTERIOR CERVICAL FUSION/FORAMINOTOMY N/A 06/08/2019   Procedure: Cervical Three to Cervical Seven Posterior cervical decompression and fusion;  Surgeon: Erline Levine, MD;  Location: [facility];  Service: Neurosurgery;  Laterality: N/A;  Cervical Three to Cervical Seven Posterior cervical decompression and fusion   RADIOLOGY WITH ANESTHESIA N/A 04/28/2019  Procedure: MRI RADIOLOGY WITH ANESTHESIA;  Surgeon: Radiologist, Medication, MD;  Location: [facility];  Service: Radiology;  Laterality: N/A;   TRIGGER FINGER RELEASE     Social History   Socioeconomic History   Marital status: Married    Spouse name: Not on file   Number of children: Not on file   Years of education: Not on file   Highest education level: Not on file  Occupational History   Not on file  Tobacco Use   Smoking status: Never   Smokeless tobacco: Never  Vaping Use   Vaping Use: Never used   Substance and Sexual Activity   Alcohol use: No   Drug use: No   Sexual activity: Yes  Other Topics Concern   Not on file  Social History Narrative   Not on file   Social Determinants of Health   Financial Resource Strain: Not on file  Food Insecurity: Not on file  Transportation Needs: Not on file  Physical Activity: Not on file  Stress: Not on file  Social Connections: Not on file   Outpatient Encounter Medications as of 12/05/2021  Medication Sig   acetaminophen (TYLENOL) 500 MG tablet Take 1,000 mg by mouth every 8 (eight) hours as needed for mild pain.   albuterol (PROAIR HFA) 108 (90 Base) MCG/ACT inhaler 2 puffs every 4 hours as needed only  if your can't catch your breath   amLODipine (NORVASC) 5 MG tablet Take 10 mg by mouth daily.   Calcium Carb-Cholecalciferol (CALCIUM 500+D3 PO) Take 1 tablet by mouth 3 (three) times daily.    calcium carbonate (TUMS - DOSED IN MG ELEMENTAL CALCIUM) 500 MG chewable tablet Chew 1-2 tablets by mouth 3 (three) times daily as needed for indigestion or heartburn.   cefdinir (OMNICEF) 300 MG capsule Take 1 capsule (300 mg total) by mouth 2 (two) times daily.   Dulaglutide (TRULICITY) 0.10 UV/2.5DG SOPN Inject 0.75 mg into the skin once a week.   escitalopram (LEXAPRO) 20 MG tablet Take 20 mg by mouth daily.   famotidine (PEPCID) 20 MG tablet One after supper   Magnesium 400 MG CAPS Take 400 mg by mouth daily.    Multiple Vitamin (MULTIVITAMIN WITH MINERALS) TABS tablet Take 1 tablet by mouth daily.   olmesartan (BENICAR) 40 MG tablet Take 40 mg by mouth daily.   Omalizumab (XOLAIR [place]) Inject 1 Dose into the skin every 14 (fourteen) days.   pantoprazole (PROTONIX) 40 MG tablet Take 1 tablet (40 mg total) by mouth daily. Take 30-60 min before first meal of the day   predniSONE (DELTASONE) 10 MG tablet Take 4 for two days three for two days two for two days one for two days   [DISCONTINUED] Dulaglutide (TRULICITY) 6.44 IH/4.7QQ SOPN Inject  0.75 mg into the skin once a week.   [DISCONTINUED] gabapentin (NEURONTIN) 300 MG capsule Take 300 mg by mouth at bedtime.   No facility-administered encounter medications on file as of 12/05/2021.   ALLERGIES: No Known Allergies VACCINATION STATUS: Immunization History  Administered Date(s) Administered   Influenza, High Dose Seasonal PF 10/19/2017, 10/29/2018   Influenza-Unspecified 10/22/2017   Tdap 12/23/2017   Zoster Recombinat (Shingrix) 12/23/2017    Diabetes He presents for his follow-up diabetic visit. He has type 2 diabetes mellitus. Onset time: He was diagnosed at approximate age of 30 years. His disease course has been stable. There are no hypoglycemic associated symptoms. Pertinent negatives for hypoglycemia include no confusion, headaches, pallor or seizures. Pertinent negatives for diabetes  include no chest pain, no polydipsia, no polyphagia, no polyuria and no weakness. There are no hypoglycemic complications. Symptoms are stable. Diabetic complications include nephropathy. Risk factors for coronary artery disease include diabetes mellitus, dyslipidemia, hypertension and male sex. His weight is decreasing steadily (He is status post Roux-en-Y gastric bypass-after successfully losing 123 pounds, he has regained approximately 20 pounds since last visit.  ). He is following a diabetic diet. When asked about meal planning, he reported none. He has not had a previous visit with a dietitian. He participates in exercise intermittently (He participates in golfing activities.). His home blood glucose trend is fluctuating minimally. Mortimer Fries presents with A1c of 6.8%. -He is currently on Trulicity 3.82 mg subcutaneously weekly.  He has no hypoglycemia.  He maintained 130 pounds weight loss after his surgery.  He is status post Roux-en-Y surgery. ) An ACE inhibitor/angiotensin II receptor blocker is being taken. Eye exam is current.  Hyperlipidemia This is a chronic problem. The current  episode started more than 1 year ago. The problem is uncontrolled. Exacerbating diseases include chronic renal disease and diabetes. He has no history of obesity. Pertinent negatives include no chest pain, myalgias or shortness of breath. Current antihyperlipidemic treatment includes statins and bile acid squestrants. Compliance problems include adherence to diet.  Risk factors for coronary artery disease include diabetes mellitus, dyslipidemia, hypertension, male sex, obesity and a sedentary lifestyle.  Hypertension This is a chronic problem. The current episode started more than 1 year ago. The problem is uncontrolled. Pertinent negatives include no chest pain, headaches, neck pain, palpitations or shortness of breath. Risk factors for coronary artery disease include diabetes mellitus, dyslipidemia and sedentary lifestyle. Past treatments include angiotensin blockers. Hypertensive end-organ damage includes kidney disease. Identifiable causes of hypertension include chronic renal disease.     Review of Systems  Constitutional:  Negative for chills, fever and unexpected weight change.  HENT:  Negative for dental problem, mouth sores and trouble swallowing.   Eyes:  Negative for visual disturbance.  Respiratory:  Negative for cough, choking, chest tightness, shortness of breath and wheezing.   Cardiovascular:  Negative for chest pain, palpitations and leg swelling.  Gastrointestinal:  Negative for abdominal distention, abdominal pain, constipation, diarrhea, nausea and vomiting.  Endocrine: Negative for polydipsia, polyphagia and polyuria.  Genitourinary:  Negative for dysuria, flank pain, hematuria and urgency.  Musculoskeletal:  Negative for back pain, gait problem, myalgias and neck pain.  Skin:  Negative for pallor, rash and wound.  Neurological:  Negative for seizures, syncope, weakness, numbness and headaches.  Psychiatric/Behavioral:  Negative for confusion and dysphoric mood.      Objective:    BP (!) 142/82   Pulse 72   Ht '5\' 11"'$  (1.803 m)   Wt 175 lb 6.4 oz (79.6 kg)   BMI 24.46 kg/m   Wt Readings from Last 3 Encounters:  12/05/21 175 lb 6.4 oz (79.6 kg)  12/01/21 177 lb 9.6 oz (80.6 kg)  08/01/21 177 lb 9.6 oz (80.6 kg)     Recent Results (from the past 2160 hour(s))  POCT EXHALED NITRIC OXIDE     Status: Abnormal   Collection Time: 12/01/21  2:19 PM  Result Value Ref Range   FeNO level (ppb) 85   CBC with Differential/Platelet     Status: Abnormal   Collection Time: 12/01/21  2:33 PM  Result Value Ref Range   WBC 7.5 3.4 - 10.8 x10E3/uL   RBC 3.89 (L) 4.14 - 5.80 x10E6/uL   Hemoglobin 11.6 (L)  13.0 - 17.7 g/dL   Hematocrit 33.9 (L) 37.5 - 51.0 %   MCV 87 79 - 97 fL   MCH 29.8 26.6 - 33.0 pg   MCHC 34.2 31.5 - 35.7 g/dL   RDW 12.3 11.6 - 15.4 %   Platelets 213 150 - 450 x10E3/uL   Neutrophils 54 Not Estab. %   Lymphs 36 Not Estab. %   Monocytes 6 Not Estab. %   Eos 3 Not Estab. %   Basos 1 Not Estab. %   Neutrophils Absolute 4.1 1.4 - 7.0 x10E3/uL   Lymphocytes Absolute 2.7 0.7 - 3.1 x10E3/uL   Monocytes Absolute 0.5 0.1 - 0.9 x10E3/uL   EOS (ABSOLUTE) 0.2 0.0 - 0.4 x10E3/uL   Basophils Absolute 0.1 0.0 - 0.2 x10E3/uL   Immature Granulocytes 0 Not Estab. %   Immature Grans (Abs) 0.0 0.0 - 0.1 x10E3/uL  IgE     Status: None   Collection Time: 12/01/21  2:33 PM  Result Value Ref Range   IgE (Immunoglobulin E), Serum 117 6 - 495 IU/mL  HgB A1c     Status: None   Collection Time: 12/05/21 10:08 AM  Result Value Ref Range   Hemoglobin A1C     HbA1c POC (<> result, manual entry)     HbA1c, POC (prediabetic range)     HbA1c, POC (controlled diabetic range) 6.8 0.0 - 7.0 %   Lipid Panel     Component Value Date/Time   CHOL 175 07/27/2021 1024   TRIG 144 07/27/2021 1024   HDL 56 07/27/2021 1024   CHOLHDL 3.1 07/27/2021 1024   CHOLHDL 3.9 04/25/2016 1119   VLDL 27 04/25/2016 1119   LDLCALC 94 07/27/2021 1024     Assessment &  Plan:   1.  type 2 diabetes mellitus  - Patient has currently controlled asymptomatic type 2 DM since  71 years of age.  Patient is status post Roux-en-Y gastric bypass.  Zaki presents with A1c of 6.8%. -He is currently on Trulicity 8.46 mg subcutaneously weekly.  He has no hypoglycemia.  He maintained 130 pounds weight loss after his surgery.  He is status post Roux-en-Y surgery.   - His diabetes is complicated by mild  CKD  and patient remains at a high risk for more acute and chronic complications of diabetes which include CAD, CVA, CKD, retinopathy, and neuropathy. These are all discussed in detail with the patient.  - I have counseled the patient on diet management and weight loss, by adopting a carbohydrate restricted/protein rich diet.  - he acknowledges that there is a room for improvement in his food and drink choices. - Suggestion is made for him to avoid simple carbohydrates  from his diet including Cakes, Sweet Desserts, Ice Cream, Soda (diet and regular), Sweet Tea, Candies, Chips, Cookies, Store Bought Juices, Alcohol , Artificial Sweeteners,  Coffee Creamer, and "Sugar-free" Products, Lemonade. This will help patient to have more stable blood glucose profile and potentially avoid unintended weight gain.  The following Lifestyle Medicine recommendations according to [place]  [name]) were discussed and and offered to patient and he  agrees to start the journey:  A. Whole Foods, Plant-Based Nutrition comprising of fruits and vegetables, plant-based proteins, whole-grain carbohydrates was discussed in detail with the patient.   A list for source of those nutrients were also provided to the patient.  Patient will use only water or unsweetened tea for hydration. B.  The need to stay away from risky  substances including alcohol, smoking; obtaining 7 to 9 hours of restorative sleep, at least 150 minutes of moderate intensity exercise weekly, the importance  of healthy social connections,  and stress management techniques were discussed. C.  A full color page of  Calorie density of various food groups per pound showing examples of each food groups was provided to the patient.   - I encouraged the patient to switch to  unprocessed or minimally processed complex starch and increased protein intake (animal or plant source), fruits, and vegetables.  - Patient is advised to stick to a routine mealtimes to eat 3 meals  a day and avoid unnecessary snacks ( to snack only to correct hypoglycemia).   - I have approached patient with the following individualized plan to manage diabetes and patient agrees:   -He has tolerated and benefiting low-dose GLP-1 receptor agonists.  He is advised to continue Trulicity 0.96 mg subcutaneously weekly.   - Patient specific target  A1c;  LDL, HDL, Triglycerides,  were discussed in detail.  2) BP/HTN: -His blood pressure is not controlled to target.  He remains on amlodipine 10 mg p.o. daily, Benicar 40 mg p.o. daily.  Whole food plant-based diet discussed above will help with blood pressure.  He wishes to address his blood pressure management with his nephrologist.    3) Lipids/HPL: Recent lipid panel showed LDL at 94, dropping from 123.  He is not on statins, would like to address it with his PMD.      4)  Weight/Diet: Status post Roux-en-Y gastric bypass, successfully lost up to 130 pounds.   His current weight is optimal for him.  CDE Consult in progress , exercise, and detailed carbohydrates information provided.  5) Chronic Care/Health Maintenance:  -Patient is on ACEI/ARB and Statin medications and encouraged to continue to follow up with Ophthalmology, Podiatrist at least yearly or according to recommendations, and advised to   stay away from smoking. I have recommended yearly flu vaccine and pneumonia vaccination at least every 5 years; moderate intensity exercise for up to 150 minutes weekly; and  sleep for at  least 7 hours a day. -He is advised to get his colonoscopy through GI.  His constipation is likely due to his pain medications.   His screening ABI was negative for PAD in March 2022.  This study will be repeated in March 2027, or sooner if needed.  - I advised patient to maintain close follow up with Vasireddy, Lanetta Inch, MD for primary care needs.   I spent 35 minutes in the care of the patient today including review of labs from [place], Lipids, Thyroid Function, Hematology (current and previous including abstractions from other facilities); face-to-face time discussing  his blood glucose readings/logs, discussing hypoglycemia and hyperglycemia episodes and symptoms, medications doses, his options of short and long term treatment based on the latest standards of care / guidelines;  discussion about incorporating lifestyle medicine;  and documenting the encounter. Risk reduction counseling performed per USPSTF guidelines to reduce  obesity and cardiovascular risk factors.     Please refer to Patient Instructions for Blood Glucose Monitoring and Insulin/Medications Dosing Guide"  in media tab for additional information. Please  also refer to " Patient Self Inventory" in the Media  tab for reviewed elements of pertinent patient history.  [place] participated in the discussions, expressed understanding, and voiced agreement with the above plans.  All questions were answered to his satisfaction. he is encouraged to contact clinic should he have  any questions or concerns prior to his return visit.    Follow up plan: In 6 months with labs. Glade Lloyd, MD Phone: [phone]  Fax: [phone]  -  This note was partially dictated with voice recognition software. Similar sounding words can be transcribed inadequately or may not  be corrected upon review.  12/05/2021, 12:27 PM

## 2021-12-11 ENCOUNTER — Other Ambulatory Visit (HOSPITAL_COMMUNITY): Payer: Self-pay

## 2021-12-11 ENCOUNTER — Telehealth: Payer: Self-pay

## 2021-12-11 NOTE — Telephone Encounter (Signed)
Received notification from  [place]  regarding a prior authorization for  Trulicity 0.'75mg'$ /0.42m .   Authorization has been APPROVED from 11/27/2021 to until further notice.   Patient may fill up to a 90 day supply.  Authorization # [id number] Key: [id number]

## 2021-12-11 NOTE — Telephone Encounter (Signed)
Pharmacy Patient Advocate Encounter   Received notification from [facility] that prior authorization for Trulicity 0.'75mg'$ /0.34m is required/requested.  PA submitted [facility] 12/11/21 [facility] via CoverMyMeds  Key [id number]  PA Case ID: [id number] Status is pending

## 2021-12-12 NOTE — Telephone Encounter (Signed)
Noted  

## 2021-12-27 ENCOUNTER — Encounter: Payer: Self-pay | Admitting: Allergy & Immunology

## 2021-12-27 ENCOUNTER — Other Ambulatory Visit: Payer: Self-pay

## 2021-12-27 ENCOUNTER — Ambulatory Visit (INDEPENDENT_AMBULATORY_CARE_PROVIDER_SITE_OTHER): Payer: Medicare Other | Admitting: Allergy & Immunology

## 2021-12-27 VITALS — BP 120/74 | HR 83 | Temp 98.0°F | Resp 18 | Ht 71.0 in | Wt 171.0 lb

## 2021-12-27 DIAGNOSIS — J4540 Moderate persistent asthma, uncomplicated: Secondary | ICD-10-CM

## 2021-12-27 DIAGNOSIS — J310 Chronic rhinitis: Secondary | ICD-10-CM | POA: Diagnosis not present

## 2021-12-27 DIAGNOSIS — B999 Unspecified infectious disease: Secondary | ICD-10-CM | POA: Diagnosis not present

## 2021-12-27 NOTE — Progress Notes (Signed)
NEW PATIENT  Date of Service/Encounter:  12/27/21  Consult requested by: Kendrick Ranch, MD   Assessment:   Moderate persistent asthma, uncomplicated - planning to change to Dupixent due to concurrent sinus disease  Recurrent infections - never had an immune workup (getting one today)  Chronic rhinitis - previously on allergen immunotherapy through someone in [place] in the early 1990s   S/p gastric bypass in 2018 and lost 100+ pounds (and has kept it off)  Type 2 diabetes mellitus - controlled with Trulicity once weekly (sees Dr. Dorris Fetch)   GERD - on Protonix  Never smoker  Plan/Recommendations:   1. Moderate persistent asthma, uncomplicated - Lung testing looks great today. - We are going to start [place] to help control your breathing. - Tammy will reach out to discuss the approval process.  - You will get your first injection here and then you can get your others at home.  - Continue with your regular asthma medications from Dr. [name].   2. Recurrent infections - All of these infections including the ear infections are weird for an adult. - Because of that, we are going to look at your immune system and make sure that this is functioning appropriately. - We will obtain some screening labs to evaluate your immune system.  - Labs to evaluate the quantitative [facility]) aspects of your immune system: IgG/IgA/IgM, CBC with differential - Labs to evaluate the qualitative ([place]) aspects of your immune system: CH50, Pneumococcal titers, Tetanus titers, Diphtheria titers - We may consider immunizations with Pneumovax and Tdap to challenge your immune system, and then obtain repeat titers in 4-6 weeks.  - We can discuss the RSV vaccine after we get this immune workup.   3. Chronic rhinitis - We cannot test for environmental allergies when Xolair is in your system. - We will test in 3 months once all of the Xolair is out of your body. - In the  meantime, continue with an over the counter antihistamine as needed.  4. Return in about 6 weeks (around 02/07/2022).    This note in its entirety was forwarded to the Provider who requested this consultation.  Subjective:   Allen [name] is a 71 y.o. male presenting today for evaluation of  Chief Complaint  Patient presents with   Asthma    Referred by Dr. [name]   Gastroesophageal Reflux   Cough    Patient states has gotten better - decreased at night    Allen [name] has a history of the following: Patient Active Problem List   Diagnosis Date Noted   Chronic asthma, moderate persistent, uncomplicated 32/44/0102   Cervical pseudoarthrosis ([place]) 06/08/2019   Vitamin D deficiency 05/19/2019   Herniated cervical disc without myelopathy 10/10/2018   Uncontrolled type 2 diabetes mellitus with hyperglycemia ([place]) 04/22/2015   Mixed hyperlipidemia 04/22/2015   Essential hypertension, benign 04/22/2015   Obesity 04/22/2015    History obtained from: chart review and patient and wife . Thankfully his wife is present because he is hard of hearing and I am hoarse today, so it would not have been a good combination otherwise.   [place] was referred by Kendrick Ranch, MD.     [name] is a 71 y.o. male presenting for an evaluation of environmental allergies and changing asthma biologic .  Asthma/Respiratory Symptom History: He sees Dr. [name] since Dr. Luan Pulling. He had previously seen Dr. Lemmie Evens for a long time.  They live in [place],  but the physician there in [place] that got him on Xolair initially to start Xolair left the practice. Then they moved tosee Dr. Lemmie Evens at that time. He has been on Xolair since 2012 and when he first started taking it, it was every two weeks. It was helping initially and he was doing well for a while. The last time that he got Xolair was last Thursday. He has been using more og his rescue inhaler and having more of the coughing and wheezing.  He goes to an infusion center in [place] where he goes to get the injection. ARound one year ago, he went to every 28 days.   He has had an AEC of 200-300 in the last year. He has never been on anything other than Xolair. His FeNO has been   Allergic Rhinitis Symptom History: He has a history of sinus issues and recurrent sinusitis.  He sees Dr. Dorothe Pea in [place]. He had a frontal sinus obliteration around 30 years ago because he was having problems getting rid of the sinus infection. He has had had number of sinuopulmoanry infections for most of his life. He has five sets of tubes in his lifetime. This is the 5th set of tubes. They do not seem phased by this at alkl.   He has been allergt tested in the distant past. He did have allergy shots in [place] ages ago. It did help to stay out of the ENT for a long period of time. Wife was giving them at home. It was in the late 1990sor so when he was getting the allergy shots.  GERD Symptom History: Dr. [name] has increased his pantoprazole to '40mg'$  once daily. This mostly manifests itself as coughing. He had a Pulmonologist in [place] [employer], and this was years ago. There was nothing exciting per the patient's family.   Otherwise, there is no history of other atopic diseases, including asthma, drug allergies, stinging insect allergies, or contact dermatitis. There is no significant infectious history. Vaccinations are up to date.    Past Medical History: Patient Active Problem List   Diagnosis Date Noted   Chronic asthma, moderate persistent, uncomplicated 42/59/5638   Cervical pseudoarthrosis ([place]) 06/08/2019   Vitamin D deficiency 05/19/2019   Herniated cervical disc without myelopathy 10/10/2018   Uncontrolled type 2 diabetes mellitus with hyperglycemia ([place]) 04/22/2015   Mixed hyperlipidemia 04/22/2015   Essential hypertension, benign 04/22/2015   Obesity 04/22/2015    Medication List:  Allergies as of 12/27/2021       Reactions    Baclofen Anxiety, Other (See Comments)   Alters mental state        Medication List        Accurate as of December 27, 2021 12:04 PM. If you have any questions, ask your nurse or doctor.          STOP taking these medications    cefdinir 300 MG capsule Commonly known as: OMNICEF Stopped by: Valentina Shaggy, MD   predniSONE 10 MG tablet Commonly known as: DELTASONE Stopped by: Valentina Shaggy, MD       TAKE these medications    acetaminophen 500 MG tablet Commonly known as: TYLENOL Take 1,000 mg by mouth every 8 (eight) hours as needed for mild pain.   albuterol 108 (90 Base) MCG/ACT inhaler Commonly known as: ProAir HFA 2 puffs every 4 hours as needed only  if your can't catch your breath   amLODipine 5 MG tablet Commonly known as: NORVASC Take 10  mg by mouth daily.   CALCIUM 500+D3 PO Take 1 tablet by mouth 3 (three) times daily.   calcium carbonate 500 MG chewable tablet Commonly known as: TUMS - dosed in mg elemental calcium Chew 1-2 tablets by mouth 3 (three) times daily as needed for indigestion or heartburn.   escitalopram 20 MG tablet Commonly known as: LEXAPRO Take 20 mg by mouth daily.   famotidine 20 MG tablet Commonly known as: Pepcid One after supper   Magnesium 400 MG Caps Take 400 mg by mouth daily.   multivitamin with minerals Tabs tablet Take 1 tablet by mouth daily.   olmesartan 40 MG tablet Commonly known as: BENICAR Take 40 mg by mouth daily.   pantoprazole 40 MG tablet Commonly known as: Protonix Take 1 tablet (40 mg total) by mouth daily. Take 30-60 min before first meal of the day   Trulicity 7.62 GB/1.5VV Sopn Generic drug: Dulaglutide Inject 0.75 mg into the skin once a week.   XOLAIR [place] Inject 1 Dose into the skin every 14 (fourteen) days.        Birth History: non-contributory  Developmental History: non-contributory  Past Surgical History: Past Surgical History:  Procedure Laterality Date    ANTERIOR CERVICAL DECOMP/DISCECTOMY FUSION Right 10/10/2018   Procedure: Right sided revision with hardware removal at Cervical three-four, Cervical four-five Anterior cervical decompression/discectomy/fusion with corpectomy at Cervical five;  Surgeon: Erline Levine, MD;  Location: [facility];  Service: Neurosurgery;  Laterality: Right;   CARDIAC CATHETERIZATION     patient reports he thinks he had one over 25 years ago   carpal tunnel Bilateral    CERVICAL VERTEBRAE EXCISION     and cleaning   CHOLECYSTECTOMY     cyst removed left hand  01-28-17   ESOPHAGOGASTRODUODENOSCOPY (EGD) WITH PROPOFOL N/A 01/31/2017   Procedure: ESOPHAGOGASTRODUODENOSCOPY (EGD) WITH PROPOFOL;  Surgeon: Alphonsa Overall, MD;  Location: [facility];  Service: General;  Laterality: N/A;   EYE SURGERY     bilateral cataract removal   GASTRIC ROUX-EN-Y N/A 06/04/2016   Procedure: LAPAROSCOPIC ROUX-EN-Y GASTRIC BYPASS WITH UPPER ENDOSCOPY;  Surgeon: Clovis Riley, MD;  Location: [facility];  Service: General;  Laterality: N/A;   NASAL SINUS SURGERY     POSTERIOR CERVICAL FUSION/FORAMINOTOMY N/A 06/08/2019   Procedure: Cervical Three to Cervical Seven Posterior cervical decompression and fusion;  Surgeon: Erline Levine, MD;  Location: [facility];  Service: Neurosurgery;  Laterality: N/A;  Cervical Three to Cervical Seven Posterior cervical decompression and fusion   RADIOLOGY WITH ANESTHESIA N/A 04/28/2019   Procedure: MRI RADIOLOGY WITH ANESTHESIA;  Surgeon: Radiologist, Medication, MD;  Location: [facility];  Service: Radiology;  Laterality: N/A;   TRIGGER FINGER RELEASE       Family History: Family History  Problem Relation Age of Onset   Hypertension Mother    Heart attack Mother    Hypertension Father    Cancer Father      Social History: Bienvenido lives at home with his wife. He has grandchildren who live right next door to them.  She lives in a house that is 71 years old. There is hardwood, tile, and carpeting in the main living  rooms. There is carpeting in the bedroom. There are no indoor animals at all. There are dust mite coverings on the bed, but not the pillows. There is no tobacco exposure in the home. 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 They do not liver near and  interstate or industrial area.    Review of Systems  Constitutional: Negative.  Negative for chills, fever, malaise/fatigue and weight loss.  HENT: Negative.  Negative for congestion, ear discharge and ear pain.   Eyes:  Negative for pain, discharge and redness.  Respiratory:  Positive for cough and shortness of breath. Negative for sputum production and wheezing.   Cardiovascular: Negative.  Negative for chest pain and palpitations.  Gastrointestinal:  Negative for abdominal pain, constipation, diarrhea, heartburn, nausea and vomiting.  Skin: Negative.  Negative for itching and rash.  Neurological:  Negative for dizziness and headaches.  Endo/Heme/Allergies:  Negative for environmental allergies. Does not bruise/bleed easily.       Objective:   Blood pressure 120/74, pulse 83, temperature 98 F (36.7 C), resp. rate 18, height '5\' 11"'$  (1.803 m), weight 171 lb (77.6 kg), SpO2 95 %. Body mass index is 23.85 kg/m.     Physical Exam Vitals reviewed.  Constitutional:      Appearance: He is well-developed.  HENT:     Head: Normocephalic and atraumatic.     Right Ear: Tympanic membrane, ear canal and external ear normal. No drainage, swelling or tenderness. Tympanic membrane is not injected, scarred, erythematous, retracted or bulging.     Left Ear: Tympanic membrane, ear canal and external ear normal. No drainage, swelling or tenderness. Tympanic membrane is not injected, scarred, erythematous, retracted or bulging.     Nose: No nasal deformity, septal deviation, mucosal edema or rhinorrhea.     Right Turbinates: Enlarged, swollen and pale.     Left Turbinates: Enlarged, swollen and pale.      Right Sinus: No maxillary sinus tenderness or frontal sinus tenderness.     Left Sinus: No maxillary sinus tenderness or frontal sinus tenderness.     Comments: Dried mucous bilaterally.     Mouth/Throat:     Lips: Pink.     Mouth: Mucous membranes are moist. Mucous membranes are not pale and not dry.     Pharynx: Uvula midline.     Comments: Cobblestoning.  Eyes:     General: Lids are normal. Allergic shiner present. No visual field deficit.       Right eye: No discharge.        Left eye: No discharge.     Conjunctiva/sclera: Conjunctivae normal.     Right eye: Right conjunctiva is not injected. No chemosis.    Left eye: Left conjunctiva is not injected. No chemosis.    Pupils: Pupils are equal, round, and reactive to light.  Cardiovascular:     Rate and Rhythm: Normal rate and regular rhythm.     Heart sounds: Normal heart sounds.  Pulmonary:     Effort: Pulmonary effort is normal. No tachypnea, accessory muscle usage or respiratory distress.     Breath sounds: Normal breath sounds. No wheezing, rhonchi or rales.  Chest:     Chest wall: No tenderness.  Abdominal:     Tenderness: There is no abdominal tenderness. There is no guarding or rebound.  Lymphadenopathy:     Head:     Right side of head: No submandibular, tonsillar or occipital adenopathy.     Left side of head: No submandibular, tonsillar or occipital adenopathy.     Cervical: No cervical adenopathy.  Skin:    Coloration: Skin is not pale.     Findings: No abrasion, erythema, petechiae or rash. Rash is not papular, urticarial or vesicular.  Neurological:     Mental Status: He is alert.  Psychiatric:        Behavior: Behavior is cooperative.      Diagnostic studies:    Spirometry: results normal (FEV1: 2.47/76%, FVC: 3.99/92%, FEV1/FVC: 62%).    Spirometry consistent with normal pattern.   Allergy Studies: labs sent instead           Salvatore Marvel, MD [facility]

## 2021-12-27 NOTE — Patient Instructions (Addendum)
1. Moderate persistent asthma, uncomplicated - Lung testing looks great today. - We are going to start [place] to help control your breathing. - Tammy will reach out to discuss the approval process.  - You will get your first injection here and then you can get your others at home.  - Continue with your regular asthma medications from Dr. [name].   2. Recurrent infections - All of these infections including the ear infections are weird for an adult. - Because of that, we are going to look at your immune system and make sure that this is functioning appropriately. - We will obtain some screening labs to evaluate your immune system.  - Labs to evaluate the quantitative [facility]) aspects of your immune system: IgG/IgA/IgM, CBC with differential - Labs to evaluate the qualitative ([place]) aspects of your immune system: CH50, Pneumococcal titers, Tetanus titers, Diphtheria titers - We may consider immunizations with Pneumovax and Tdap to challenge your immune system, and then obtain repeat titers in 4-6 weeks.  - We can discuss the RSV vaccine after we get this immune workup.   3. Chronic rhinitis - We cannot test for environmental allergies when Xolair is in your system. - We will test in 3 months once all of the Xolair is out of your body. - In the meantime, continue with an over the counter antihistamine as needed.  4. Return in about 6 weeks (around 02/07/2022).    Please inform us of any Emergency Department visits, hospitalizations, or changes in symptoms. Call us before going to the ED for breathing or allergy symptoms since we might be able to fit you in for a sick visit. Feel free to contact us anytime with any questions, problems, or concerns.  It was a pleasure to meet you and your family today!  Websites that have reliable patient information: 1. American Academy of Asthma, Allergy, and Immunology: www.aaaai.org 2. Food Allergy Research and Education (FARE):  foodallergy.org 3. Mothers of Asthmatics: http://www.asthmacommunitynetwork.org 4. American College of Allergy, Asthma, and Immunology: www.acaai.org   COVID-19 Vaccine Information can be found at: ShippingScam.[email or web address].uk For questions related to vaccine distribution or appointments, please email [email or web address] or call [phone].   We realize that you might be concerned about having an allergic reaction to the COVID19 vaccines. To help with that concern, WE ARE OFFERING THE COVID19 VACCINES IN OUR OFFICE! Ask the front desk for dates!     "Like" [name] on Facebook and Instagram for our latest updates!      A healthy democracy works best when [employer] participate! Make sure you are registered to vote! If you have moved or changed any of your contact information, you will need to get this updated before voting!  In some cases, you MAY be able to register to vote online: [email or web address]

## 2022-01-03 ENCOUNTER — Telehealth: Payer: Self-pay | Admitting: *Deleted

## 2022-01-03 LAB — CBC WITH DIFFERENTIAL
Basophils Absolute: 0 10*3/uL (ref 0.0–0.2)
Basos: 0 %
EOS (ABSOLUTE): 0.2 10*3/uL (ref 0.0–0.4)
Eos: 3 %
Hematocrit: 33.9 % — ABNORMAL LOW (ref 37.5–51.0)
Hemoglobin: 11.8 g/dL — ABNORMAL LOW (ref 13.0–17.7)
Immature Grans (Abs): 0 10*3/uL (ref 0.0–0.1)
Immature Granulocytes: 0 %
Lymphocytes Absolute: 1.7 10*3/uL (ref 0.7–3.1)
Lymphs: 25 %
MCH: 31 pg (ref 26.6–33.0)
MCHC: 34.8 g/dL (ref 31.5–35.7)
MCV: 89 fL (ref 79–97)
Monocytes Absolute: 0.4 10*3/uL (ref 0.1–0.9)
Monocytes: 5 %
Neutrophils Absolute: 4.6 10*3/uL (ref 1.4–7.0)
Neutrophils: 67 %
RBC: 3.81 x10E6/uL — ABNORMAL LOW (ref 4.14–5.80)
RDW: 13 % (ref 11.6–15.4)
WBC: 7 10*3/uL (ref 3.4–10.8)

## 2022-01-03 LAB — DIPHTHERIA / TETANUS ANTIBODY PANEL
Diphtheria Ab: 0.29 IU/mL (ref <0.10)
Tetanus Ab, IgG: 1.06 IU/mL (ref <0.10)

## 2022-01-03 LAB — STREP PNEUMONIAE 23 SEROTYPES IGG
Pneumo Ab Type 1*: 0.8 ug/mL — ABNORMAL LOW (ref >1.3)
Pneumo Ab Type 12 (12F)*: 0.6 ug/mL — ABNORMAL LOW (ref >1.3)
Pneumo Ab Type 14*: 2.5 ug/mL (ref >1.3)
Pneumo Ab Type 17 (17F)*: 1.8 ug/mL (ref >1.3)
Pneumo Ab Type 19 (19F)*: 3.1 ug/mL (ref >1.3)
Pneumo Ab Type 2*: 3.4 ug/mL (ref >1.3)
Pneumo Ab Type 20*: 1.8 ug/mL (ref >1.3)
Pneumo Ab Type 22 (22F)*: 2.6 ug/mL (ref >1.3)
Pneumo Ab Type 23 (23F)*: 0.3 ug/mL — ABNORMAL LOW (ref >1.3)
Pneumo Ab Type 26 (6B)*: 0.5 ug/mL — ABNORMAL LOW (ref >1.3)
Pneumo Ab Type 3*: 0.8 ug/mL — ABNORMAL LOW (ref >1.3)
Pneumo Ab Type 34 (10A)*: 2.9 ug/mL (ref >1.3)
Pneumo Ab Type 4*: 0.3 ug/mL — ABNORMAL LOW (ref >1.3)
Pneumo Ab Type 43 (11A)*: 0.2 ug/mL — ABNORMAL LOW (ref >1.3)
Pneumo Ab Type 5*: 0.5 ug/mL — ABNORMAL LOW (ref >1.3)
Pneumo Ab Type 51 (7F)*: 0.2 ug/mL — ABNORMAL LOW (ref >1.3)
Pneumo Ab Type 54 (15B)*: 3.2 ug/mL (ref >1.3)
Pneumo Ab Type 56 (18C)*: 0.1 ug/mL — ABNORMAL LOW (ref >1.3)
Pneumo Ab Type 57 (19A)*: 1 ug/mL — ABNORMAL LOW (ref >1.3)
Pneumo Ab Type 68 (9V)*: 0.5 ug/mL — ABNORMAL LOW (ref >1.3)
Pneumo Ab Type 70 (33F)*: >10.1 ug/mL (ref >1.3)
Pneumo Ab Type 8*: 2.1 ug/mL (ref >1.3)
Pneumo Ab Type 9 (9N)*: 1 ug/mL — ABNORMAL LOW (ref >1.3)

## 2022-01-03 LAB — IGG, IGA, IGM
IgA/Immunoglobulin A, Serum: 210 mg/dL (ref 61–437)
IgG (Immunoglobin G), Serum: 820 mg/dL (ref 603–1613)
IgM (Immunoglobulin M), Srm: 140 mg/dL (ref 15–143)

## 2022-01-03 LAB — COMPLEMENT, TOTAL: Compl, Total (CH50): 58 U/mL (ref >41)

## 2022-01-03 NOTE — Telephone Encounter (Signed)
-----   Message from Valentina Shaggy, MD sent at 12/27/2021 12:23 PM EST ----- Can we change to [place]? He is currently on Xolair for the past 13 years or so.

## 2022-01-03 NOTE — Telephone Encounter (Signed)
Spoke to patient and wife regarding change from Xolair to [place] for asthma and will mail consent form for [place] [address] and have patient sign and send back with copy of Part D card

## 2022-01-03 NOTE — Telephone Encounter (Signed)
Thank you much!   Divine Imber, MD [facility]  

## 2022-02-14 ENCOUNTER — Ambulatory Visit (INDEPENDENT_AMBULATORY_CARE_PROVIDER_SITE_OTHER): Payer: Medicare Other | Admitting: Allergy & Immunology

## 2022-02-14 ENCOUNTER — Other Ambulatory Visit: Payer: Self-pay

## 2022-02-14 ENCOUNTER — Other Ambulatory Visit: Payer: Self-pay | Admitting: Allergy & Immunology

## 2022-02-14 ENCOUNTER — Encounter: Payer: Self-pay | Admitting: Allergy & Immunology

## 2022-02-14 VITALS — BP 128/72 | HR 70 | Temp 98.1°F | Resp 20 | Ht 71.0 in | Wt 174.0 lb

## 2022-02-14 DIAGNOSIS — J310 Chronic rhinitis: Secondary | ICD-10-CM | POA: Diagnosis not present

## 2022-02-14 DIAGNOSIS — J4540 Moderate persistent asthma, uncomplicated: Secondary | ICD-10-CM

## 2022-02-14 DIAGNOSIS — B999 Unspecified infectious disease: Secondary | ICD-10-CM

## 2022-02-14 MED ORDER — FLUTICASONE FUROATE-VILANTEROL 200-25 MCG/ACT IN AEPB: 1.0000 | INHALATION_SPRAY | Freq: Every day | RESPIRATORY_TRACT | 5 refills | Status: DC

## 2022-02-14 MED ORDER — AMOXICILLIN-POT CLAVULANATE 875-125 MG PO TABS: 1.0000 | ORAL_TABLET | Freq: Two times a day (BID) | ORAL | 0 refills | Status: AC

## 2022-02-14 NOTE — Patient Instructions (Addendum)
1. Moderate persistent asthma, uncomplicated - Lung testing not done today since it was normal last time. - We are going to get the [place] on board. - We are sending a controller medication.  - Daily controller medication(s): Breo 200/48mg one puff once daily - Prior to physical activity: albuterol 2 puffs 10-15 minutes before physical activity. - Rescue medications: albuterol 4 puffs every 4-6 hours as needed - Asthma control goals:  * Full participation in all desired activities (may need albuterol before activity) * Albuterol use two time or less a week on average (not counting use with activity) * Cough interfering with sleep two time or less a month * Oral steroids no more than once a year * No hospitalizations  2. Recurrent infections - We are going to get repeat Streptococcal titers since you got your vaccination.  - Get that in 1-2 weeks to give your immune system enough time to respond to it.  - Start Augmentin twice daily for ten days to treat your current respiratory infection.  - Call u[place]if you are not feeling better by Monday!   3. Chronic rhinitis - We cannot test for environmental allergies when Xolair is in your system. - We will test in 3 months once all of the Xolair is out of your body. - In the meantime, continue with an over the counter antihistamine as needed.  4. Return in about 4 months (around 06/15/2022).    Please inform u[place]of any Emergency Department visits, hospitalizations, or changes in symptoms. Call u[place]abefore going to the ED for breathing or allergy symptoms since we might be able to fit you in for a sick visit. Feel free to contact u[place]oreaanytime with any questions, problems, or concerns.  It was a pleasure to see you guys today! BELATED HAPPY BIRTHDAY!   Websites that have reliable patient information: 1. American Academy of Asthma, Allergy, and Immunology: www.aaaai.org 2. Food Allergy Research and Education (FARE): foodallergy.org 3. Mothers of  Asthmatics: http://www.asthmacommunitynetwork.org 4. American College of Allergy, Asthma, and Immunology: www.acaai.org   COVID-19 Vaccine Information can be found at: [email or web address] questions related to vaccine distribution or appointments, please email [email or web address] or call [phone]   We realize that you might be concerned about having an allergic reaction to the COVID19 vaccines. To help with that concern, WE ARE OFFERING THE COVID19 VACCINES IN OUR OFFICE! Ask the front desk for dates!     "Like" u[place]on Facebook and Instagram for our latest updates!      A healthy democracy works best when ANew [employer] Life Insuranceparticipate! Make sure you are registered to vote! If you have moved or changed any of your contact information, you will need to get this updated before voting!  In some cases, you MAY be able to register to vote online: [email or web address]

## 2022-02-14 NOTE — Telephone Encounter (Signed)
Pts insurance does not cover breo but will advair please advise to change

## 2022-02-14 NOTE — Progress Notes (Signed)
FOLLOW UP  Date of Service/Encounter:  02/14/22   Assessment:   Moderate persistent asthma, uncomplicated - planning to change to Dupixent due to concurrent sinus disease   Recurrent infections - with inadequate protection against Streptococcus pneumonia (getting repeat titers in the next couple of weeks)   Chronic rhinitis - previously on allergen immunotherapy through someone in [place] in the early 1990s    S/p gastric bypass in 2018 and lost 100+ pounds (and has kept it off)   Type 2 diabetes mellitus - controlled with Trulicity once weekly (sees Allen [name])    GERD - on Protonix   Never smoker    Plan/Recommendations:   1. Moderate persistent asthma, uncomplicated - Lung testing not done today since it was normal last time. - We are going to get the [place] on board. - We are sending a controller medication.  - Daily controller medication(s): Breo 200/2mg one puff once daily - Prior to physical activity: albuterol 2 puffs 10-15 minutes before physical activity. - Rescue medications: albuterol 4 puffs every 4-6 hours as needed - Asthma control goals:  * Full participation in all desired activities (may need albuterol before activity) * Albuterol use two time or less a week on average (not counting use with activity) * Cough interfering with sleep two time or less a month * Oral steroids no more than once a year * No hospitalizations  2. Recurrent infections - We are going to get repeat Streptococcal titers since you got your vaccination.  - Get that in 1-2 weeks to give your immune system enough time to respond to it.  - Start Augmentin twice daily for ten days to treat your current respiratory infection.  - Call [place] you are not feeling better by Monday!   3. Chronic rhinitis - We cannot test for environmental allergies when Xolair is in your system. - We will test in 3 months once all of the Xolair is out of your body. - In the meantime, continue with  an over the counter antihistamine as needed.  4. Return in about 4 months (around 06/15/2022).   Subjective:   Allen Tugwellis a 72y.o. male presenting today for follow up of  Chief Complaint  Patient presents with   Follow-up    Follow up     BBoykinhas a history of the following: Patient Active Problem List   Diagnosis Date Noted   Chronic asthma, moderate persistent, uncomplicated 123/55/7322  [place]ervical [place]arthrosis [place] 06/08/2019   Vitamin D deficiency 05/19/2019   Herniated cervical disc without myelopathy 10/10/2018   Uncontrolled type 2 diabetes mellitus with hyperglycemia ([place] 04/22/2015   Mixed hyperlipidemia 04/22/2015   Essential hypertension, benign 04/22/2015   Obesity 04/22/2015    History obtained from: chart review and patient and his wife.  BMuaazis a 72y.o. male presenting for a follow up visit.  He was last seen in December 2020.  At that time, his lung testing looked great.  We continued him on his regular allergy medications from Allen [name]  We talked about starting [place]for long-term control.  He had a history of recurrent infections, so obtain the lab work which is immune system.  His rhinitis, we plan to do environmental allergy testing once Xolair was out of the system.  Since last visit, he has done well.   Asthma/Respiratory Symptom History: He feels that he is doing better. They are staying closed in and away from sick children and  other people. He is supposed to get it today.  He was on Advair while ago and feels that that works well.  He has not been on a controller medication in quite some time.  Allen [name] never recommended a controller medication.  He and his wife think that he would benefit from 1.   Allergic Rhinitis Symptom History: He is having a lot of productive cough with yellow mucus.  He reports some sinus pain and pressure.  It was becoming clear and then got worse.  Symptoms have been going on for over 1 week.  He  has COVID tested and was negative.  Vaccinations are all up-to-date.  He did get the new pneumonia vaccine after he saw Allen [name]. He did fine with the RSV vaccine. He did not have any complications from it.  He had both vaccines on the same day in fact.  Otherwise, there have been no changes to his past medical history, surgical history, family history, or social history.    Review of Systems  Constitutional: Negative.  Negative for chills, fever, malaise/fatigue and weight loss.  HENT:  Positive for congestion, sinus pain and sore throat. Negative for ear discharge and ear pain.   Eyes:  Negative for photophobia, pain, discharge and redness.  Respiratory:  Negative for cough, sputum production, shortness of breath and wheezing.   Cardiovascular: Negative.  Negative for chest pain and palpitations.  Gastrointestinal:  Negative for abdominal pain, constipation, diarrhea, heartburn, nausea and vomiting.  Skin: Negative.  Negative for itching and rash.  Neurological:  Negative for dizziness and headaches.  Endo/Heme/Allergies:  Negative for environmental allergies. Does not bruise/bleed easily.       Objective:   Blood pressure 128/72, pulse 70, temperature 98.1 F (36.7 C), resp. rate 20, height '5\' 11"'$  (1.803 m), weight 174 lb (78.9 kg), SpO2 95 %. Body mass index is 24.27 kg/m.    Physical Exam Vitals reviewed.  Constitutional:      Appearance: He is well-developed.  HENT:     Head: Normocephalic and atraumatic.     Right Ear: Tympanic membrane, ear canal and external ear normal. No drainage, swelling or tenderness. Tympanic membrane is not injected, scarred, erythematous, retracted or bulging.     Left Ear: Tympanic membrane, ear canal and external ear normal. No drainage, swelling or tenderness. Tympanic membrane is not injected, scarred, erythematous, retracted or bulging.     Nose: No nasal deformity, septal deviation, mucosal edema or rhinorrhea.     Right Turbinates: Enlarged,  swollen and pale.     Left Turbinates: Enlarged, swollen and pale.     Right Sinus: Maxillary sinus tenderness present. No frontal sinus tenderness.     Left Sinus: Maxillary sinus tenderness present. No frontal sinus tenderness.     Mouth/Throat:     Lips: Pink.     Mouth: Mucous membranes are moist. Mucous membranes are not pale and not dry.     Pharynx: Uvula midline.     Comments: Cobblestoning.  Eyes:     General: Lids are normal. Allergic shiner present. No visual field deficit.       Right eye: No discharge.        Left eye: No discharge.     Conjunctiva/sclera: Conjunctivae normal.     Right eye: Right conjunctiva is not injected. No chemosis.    Left eye: Left conjunctiva is not injected. No chemosis.    Pupils: Pupils are equal, round, and reactive to light.  Cardiovascular:  Rate and Rhythm: Normal rate and regular rhythm.     Heart sounds: Normal heart sounds.  Pulmonary:     Effort: Pulmonary effort is normal. No tachypnea, accessory muscle usage or respiratory distress.     Breath sounds: Normal breath sounds. No wheezing, rhonchi or rales.     Comments: Coarse upper airway sounds throughout.  Chest:     Chest wall: No tenderness.  Abdominal:     Tenderness: There is no abdominal tenderness. There is no guarding or rebound.  Lymphadenopathy:     Head:     Right side of head: No submandibular, tonsillar or occipital adenopathy.     Left side of head: No submandibular, tonsillar or occipital adenopathy.     Cervical: No cervical adenopathy.  Skin:    Coloration: Skin is not pale.     Findings: No abrasion, erythema, petechiae or rash. Rash is not papular, urticarial or vesicular.  Neurological:     Mental Status: He is alert.  Psychiatric:        Behavior: Behavior is cooperative.      Diagnostic studies: none    Salvatore Marvel, MD  [facility]

## 2022-02-16 ENCOUNTER — Ambulatory Visit (INDEPENDENT_AMBULATORY_CARE_PROVIDER_SITE_OTHER): Payer: Medicare Other

## 2022-02-16 ENCOUNTER — Encounter: Payer: Self-pay | Admitting: Allergy & Immunology

## 2022-02-16 DIAGNOSIS — J4550 Severe persistent asthma, uncomplicated: Secondary | ICD-10-CM

## 2022-02-16 MED ORDER — DUPILUMAB 300 MG/2ML ~~LOC~~ SOSY
300.0000 mg | PREFILLED_SYRINGE | SUBCUTANEOUS | Status: AC
  Administered 2022-02-16 – 2022-03-02 (×2): 300 mg via SUBCUTANEOUS

## 2022-02-16 MED ORDER — BREO ELLIPTA 200-25 MCG/ACT IN AEPB: 1.0000 | INHALATION_SPRAY | Freq: Every day | RESPIRATORY_TRACT | 5 refills | Status: DC

## 2022-02-16 NOTE — Progress Notes (Signed)
Immunotherapy   Patient Details  Name: Allen [name] MRN: [id number] Date of Birth: Mar 09, 1950  02/16/2022  Cucumber started injections for  Dupixent Following schedule: Every fourteen days.  Frequency:Every two weeks.  Epi-Pen:Not needed.  Consent signed in office and patient instructions given. Patient received two injections today as he was getting a loading dose. I injected one injection into his left arm and his wife injected one into his right arm. Wife expressed that she had been giving him injections for the past few years. Patient and wife expressed that they would come back in two weeks to administer themselves as directed by Tammy. Patient and his wife sat in the lobby for thirty minutes without an issue.    Julius Bowels 02/16/2022, 10:16 AM

## 2022-02-16 NOTE — Addendum Note (Signed)
Addended by: Clovis Cao A on: 02/16/2022 09:44 AM   Modules accepted: Orders

## 2022-03-02 ENCOUNTER — Ambulatory Visit (INDEPENDENT_AMBULATORY_CARE_PROVIDER_SITE_OTHER): Payer: Medicare Other

## 2022-03-02 DIAGNOSIS — J4550 Severe persistent asthma, uncomplicated: Secondary | ICD-10-CM | POA: Diagnosis not present

## 2022-03-15 LAB — STREP PNEUMONIAE 23 SEROTYPES IGG
Pneumo Ab Type 1*: 0.8 ug/mL — ABNORMAL LOW (ref >1.3)
Pneumo Ab Type 12 (12F)*: 0.7 ug/mL — ABNORMAL LOW (ref >1.3)
Pneumo Ab Type 14*: 6 ug/mL (ref >1.3)
Pneumo Ab Type 17 (17F)*: 1.6 ug/mL (ref >1.3)
Pneumo Ab Type 19 (19F)*: 1.9 ug/mL (ref >1.3)
Pneumo Ab Type 2*: 2.8 ug/mL (ref >1.3)
Pneumo Ab Type 20*: 0.8 ug/mL — ABNORMAL LOW (ref >1.3)
Pneumo Ab Type 22 (22F)*: 10.7 ug/mL (ref >1.3)
Pneumo Ab Type 23 (23F)*: 0.1 ug/mL — ABNORMAL LOW (ref >1.3)
Pneumo Ab Type 26 (6B)*: <0.1 ug/mL — ABNORMAL LOW (ref >1.3)
Pneumo Ab Type 3*: 0.4 ug/mL — ABNORMAL LOW (ref >1.3)
Pneumo Ab Type 34 (10A)*: 2.7 ug/mL (ref >1.3)
Pneumo Ab Type 4*: 0.4 ug/mL — ABNORMAL LOW (ref >1.3)
Pneumo Ab Type 43 (11A)*: 0.1 ug/mL — ABNORMAL LOW (ref >1.3)
Pneumo Ab Type 5*: 0.4 ug/mL — ABNORMAL LOW (ref >1.3)
Pneumo Ab Type 51 (7F)*: 0.4 ug/mL — ABNORMAL LOW (ref >1.3)
Pneumo Ab Type 54 (15B)*: 4 ug/mL (ref >1.3)
Pneumo Ab Type 56 (18C)*: 0.3 ug/mL — ABNORMAL LOW (ref >1.3)
Pneumo Ab Type 57 (19A)*: 1.4 ug/mL (ref >1.3)
Pneumo Ab Type 68 (9V)*: 0.6 ug/mL — ABNORMAL LOW (ref >1.3)
Pneumo Ab Type 70 (33F)*: 13.6 ug/mL (ref >1.3)
Pneumo Ab Type 8*: 1.9 ug/mL (ref >1.3)
Pneumo Ab Type 9 (9N)*: 0.5 ug/mL — ABNORMAL LOW (ref >1.3)

## 2022-04-11 ENCOUNTER — Encounter: Payer: Self-pay | Admitting: Allergy & Immunology

## 2022-04-11 ENCOUNTER — Other Ambulatory Visit: Payer: Self-pay

## 2022-04-11 ENCOUNTER — Ambulatory Visit (INDEPENDENT_AMBULATORY_CARE_PROVIDER_SITE_OTHER): Payer: Medicare Other | Admitting: Allergy & Immunology

## 2022-04-11 VITALS — BP 122/72 | HR 70 | Temp 98.3°F | Resp 20 | Ht 71.0 in | Wt 172.0 lb

## 2022-04-11 DIAGNOSIS — J310 Chronic rhinitis: Secondary | ICD-10-CM

## 2022-04-11 DIAGNOSIS — B999 Unspecified infectious disease: Secondary | ICD-10-CM | POA: Diagnosis not present

## 2022-04-11 DIAGNOSIS — J4550 Severe persistent asthma, uncomplicated: Secondary | ICD-10-CM

## 2022-04-11 NOTE — Progress Notes (Unsigned)
FOLLOW UP  Date of Service/Encounter:  04/11/22   Assessment:   Moderate persistent asthma, uncomplicated - with worsening symptoms since transitioning from Xolair to [place] sputum production - consider sinus and/or chest CT  Recurrent infections - with inadequate protection against Streptococcus pneumonia (getting repeat titers)  Consider prophylactic antibiotics?    Chronic rhinitis - previously on allergen immunotherapy through someone in [place] in the early 1990s    S/p gastric bypass in 2018 and lost 100+ pounds (and has kept it off)   Type 2 diabetes mellitus - controlled with Trulicity once weekly (sees Dr. Dorris Fetch)    GERD - on Protonix   Never smoker    Plan/Recommendations:   1. Moderate persistent asthma, uncomplicated - Lung testing not done today since it was normal last time. - We are going to give you a sample of Trelegy (contains Breo plus another medication to help with relaxing your lungs).  - Sample of this provided today. - We will send in a 90 day supply of the Trelegy.  - Information on the Tezspire provided.  - Daily controller medication(s): Trelegy 200/62.5/25 one puff once daily  - Prior to physical activity: albuterol 2 puffs 10-15 minutes before physical activity. - Rescue medications: albuterol 4 puffs every 4-6 hours as needed - Asthma control goals:  * Full participation in all desired activities (may need albuterol before activity) * Albuterol use two time or less a week on average (not counting use with activity) * Cough interfering with sleep two time or less a month * Oral steroids no more than once a year * No hospitalizations  2. Recurrent infections - We are going to get repeat Streptococcal titers since you got your vaccination two weeks before this.  - We might need have to start immunoglobulin replacement. - This might be where we are headed.   3. Chronic rhinitis - We are retesting via the  blood.  - We will call you in 1-2 weeks with the results of the testing.   4. Return in about 2 months (around 06/11/2022).   Subjective:   Allen [name] is a 72 y.o. male presenting today for follow up of  Chief Complaint  Patient presents with   Injections    Pt wants to discuss other injections options the current one dupixent does not seem to be helping anythe patient states he dont his asthma has inproved any.       Allen [name] has a history of the following: Patient Active Problem List   Diagnosis Date Noted   Chronic asthma, moderate persistent, uncomplicated [id number]   Cervical pseudoarthrosis ([place]) 06/08/2019   Vitamin D deficiency 05/19/2019   Herniated cervical disc without myelopathy 10/10/2018   Uncontrolled type 2 diabetes mellitus with hyperglycemia ([place]) 04/22/2015   Mixed hyperlipidemia 04/22/2015   Essential hypertension, benign 04/22/2015   Obesity 04/22/2015    History obtained from: chart review and patient and his wife .  Allen [name] is a 72 y.o. male presenting for a follow up visit.  He was last seen in January 2024.  At that time, we did not do lung testing.  We continue with Breo 200 mcg 1 puff once daily and got Dupixent on board.  For his history of recurrent infections, we started Augmentin twice daily for 10 days and got a repeat streptococcal titer panel.  For his rhinitis, we plan to retest after Xolair was out of his body.  His postvaccination titers did not move at  all.    Since the last visit, he has not done well. However, he tells me that he feels better today than he has in some months.   Asthma/Respiratory Symptom History: He has had four injections of the Dupixent and has not noticed an improvement. He has had some blurry vision  without dryness of blurriness. He did see his eye doctor and everything was fine. He is having a productive cough with sputum that ranges from clear to yellow. Of note, he has no history of COPD. His ENT did  not think that this is related to postnasal drip or sinus issues. He has not seen Dr. [name] in some time. Last CXR was normal per his wife. He does not know whether he has had a sinus CT or a chest CT. He was on Xolair for several years and he remained fairly stable with this, but it stopped working which is why he was referred to our practice for further management. He is open to trying Trelegy. He has been on this in the past but he does not remember whether this was very helpful with symptom control.   Allergic Rhinitis Symptom History: He feels that his sinus issues have worsened since the last time that we saw him. We did discuss his Streptococcal titers, but apparently this lab work was drawn only two weeks after the vaccine (rather than 4-6 weeks as recommended). His wife did not know that they were supposed to wait that long to have the labs drawn. They are willing to go ahead and get these drawn again. He seems to require multiple antibiotics over and over again. He does well for 1-2 weeks after antibiotics and then seems to need another one. These are all managed by his ENT, Dr. Ellwood Dense. We did spend some time discusisng immunoglobulin infusions today and discussed in particular to processing of the plasma donations before it is injected into recipients.   They did bring some labs from his ENT today. It looks like it was IgG, IgA, and IgM levels. However, we had already done these and they were normal.   They have a question whether he needs another COVID booster. He has had 6 or 7 per his wife.   Otherwise, there have been no changes to his past medical history, surgical history, family history, or social history.    Review of Systems  Constitutional: Negative.  Negative for chills, fever, malaise/fatigue and weight loss.  HENT:  Positive for congestion. Negative for ear discharge, ear pain, sinus pain and sore throat.   Eyes:  Negative for photophobia, pain, discharge and redness.   Respiratory:  Positive for cough and sputum production. Negative for shortness of breath and wheezing.   Cardiovascular: Negative.  Negative for chest pain and palpitations.  Gastrointestinal:  Negative for abdominal pain, constipation, diarrhea, heartburn, nausea and vomiting.  Skin: Negative.  Negative for itching and rash.  Neurological:  Negative for dizziness and headaches.  Endo/Heme/Allergies:  Negative for environmental allergies. Does not bruise/bleed easily.       Objective:   Blood pressure 122/72, pulse 70, temperature 98.3 F (36.8 C), resp. rate 20, height 5\' 11"  (1.803 m), weight 172 lb (78 kg), SpO2 95 %. Body mass index is 23.99 kg/m.    Physical Exam Vitals reviewed.  Constitutional:      Appearance: He is well-developed.  HENT:     Head: Normocephalic and atraumatic.     Right Ear: Tympanic membrane, ear canal and external ear normal. No  drainage, swelling or tenderness. Tympanic membrane is not injected, scarred, erythematous, retracted or bulging.     Left Ear: Tympanic membrane, ear canal and external ear normal. No drainage, swelling or tenderness. Tympanic membrane is not injected, scarred, erythematous, retracted or bulging.     Nose: No nasal deformity, septal deviation, mucosal edema or rhinorrhea.     Right Turbinates: Enlarged, swollen and pale.     Left Turbinates: Enlarged, swollen and pale.     Right Sinus: No maxillary sinus tenderness or frontal sinus tenderness.     Left Sinus: No maxillary sinus tenderness or frontal sinus tenderness.     Mouth/Throat:     Lips: Pink.     Mouth: Mucous membranes are moist. Mucous membranes are not pale and not dry.     Pharynx: Uvula midline.     Comments: Cobblestoning.  Eyes:     General: Lids are normal. Allergic shiner present. No visual field deficit.       Right eye: No discharge.        Left eye: No discharge.     Conjunctiva/sclera: Conjunctivae normal.     Right eye: Right conjunctiva is not  injected. No chemosis.    Left eye: Left conjunctiva is not injected. No chemosis.    Pupils: Pupils are equal, round, and reactive to light.  Cardiovascular:     Rate and Rhythm: Normal rate and regular rhythm.     Heart sounds: Normal heart sounds.  Pulmonary:     Effort: Pulmonary effort is normal. No tachypnea, accessory muscle usage or respiratory distress.     Breath sounds: Normal breath sounds. No wheezing, rhonchi or rales.     Comments: Coarse upper airway sounds throughout. Referred upper airway noises.  Chest:     Chest wall: No tenderness.  Lymphadenopathy:     Head:     Right side of head: No submandibular, tonsillar or occipital adenopathy.     Left side of head: No submandibular, tonsillar or occipital adenopathy.     Cervical: No cervical adenopathy.  Skin:    Coloration: Skin is not pale.     Findings: No abrasion, erythema, petechiae or rash. Rash is not papular, urticarial or vesicular.  Neurological:     Mental Status: He is alert.  Psychiatric:        Behavior: Behavior is cooperative.      Diagnostic studies: none       Salvatore Marvel, MD  [facility]

## 2022-04-11 NOTE — Patient Instructions (Addendum)
1. Moderate persistent asthma, uncomplicated - Lung testing not done today since it was normal last time. - We are going to give you a sample of Trelegy (contains Breo plus another medication to help with relaxing your lungs).  - Sample of this provided today. - We will send in a 90 day supply of the Trelegy.  - Information on the Tezspire provided.  - Daily controller medication(s): Trelegy 200/62.5/25 one puff once daily  - Prior to physical activity: albuterol 2 puffs 10-15 minutes before physical activity. - Rescue medications: albuterol 4 puffs every 4-6 hours as needed - Asthma control goals:  * Full participation in all desired activities (may need albuterol before activity) * Albuterol use two time or less a week on average (not counting use with activity) * Cough interfering with sleep two time or less a month * Oral steroids no more than once a year * No hospitalizations  2. Recurrent infections - We are going to get repeat Streptococcal titers since you got your vaccination two weeks before this.  - We might need have to start immunoglobulin replacement. - This might be where we are headed.   3. Chronic rhinitis - We are retesting via the blood.  - We will call you in 1-2 weeks with the results of the testing.   4. Return in about 2 months (around 06/11/2022).    Please inform us of any Emergency Department visits, hospitalizations, or changes in symptoms. Call us before going to the ED for breathing or allergy symptoms since we might be able to fit you in for a sick visit. Feel free to contact us anytime with any questions, problems, or concerns.  It was a pleasure to see you guys today!   Websites that have reliable patient information: 1. American Academy of Asthma, Allergy, and Immunology: www.aaaai.org 2. Food Allergy Research and Education (FARE): foodallergy.org 3. Mothers of Asthmatics: http://www.asthmacommunitynetwork.org 4. American College of Allergy, Asthma,  and Immunology: www.acaai.org   COVID-19 Vaccine Information can be found at: ShippingScam.co.uk For questions related to vaccine distribution or appointments, please email vaccine@[email or web address] [email or web address] or call [phone].   We realize that you might be concerned about having an allergic reaction to the COVID19 vaccines. To help with that concern, WE ARE OFFERING THE COVID19 VACCINES IN OUR OFFICE! Ask the front desk for dates!     "Like" [name] on Facebook and Instagram for our latest updates!      A healthy democracy works best when [employer] participate! Make sure you are registered to vote! If you have moved or changed any of your contact information, you will need to get this updated before voting!  In some cases, you MAY be able to register to vote online: [email or web address]

## 2022-04-18 LAB — STREP PNEUMONIAE 23 SEROTYPES IGG
Pneumo Ab Type 1*: 0.9 ug/mL — ABNORMAL LOW (ref >1.3)
Pneumo Ab Type 12 (12F)*: 0.6 ug/mL — ABNORMAL LOW (ref >1.3)
Pneumo Ab Type 14*: 5.7 ug/mL (ref >1.3)
Pneumo Ab Type 17 (17F)*: 1.5 ug/mL (ref >1.3)
Pneumo Ab Type 19 (19F)*: 1.9 ug/mL (ref >1.3)
Pneumo Ab Type 2*: 2.5 ug/mL (ref >1.3)
Pneumo Ab Type 20*: 1 ug/mL — ABNORMAL LOW (ref >1.3)
Pneumo Ab Type 22 (22F)*: 7.4 ug/mL (ref >1.3)
Pneumo Ab Type 23 (23F)*: 0.1 ug/mL — ABNORMAL LOW (ref >1.3)
Pneumo Ab Type 26 (6B)*: <0.1 ug/mL — ABNORMAL LOW (ref >1.3)
Pneumo Ab Type 3*: 0.3 ug/mL — ABNORMAL LOW (ref >1.3)
Pneumo Ab Type 34 (10A)*: 2.9 ug/mL (ref >1.3)
Pneumo Ab Type 4*: 0.3 ug/mL — ABNORMAL LOW (ref >1.3)
Pneumo Ab Type 43 (11A)*: 0.1 ug/mL — ABNORMAL LOW (ref >1.3)
Pneumo Ab Type 5*: 0.4 ug/mL — ABNORMAL LOW (ref >1.3)
Pneumo Ab Type 51 (7F)*: 0.5 ug/mL — ABNORMAL LOW (ref >1.3)
Pneumo Ab Type 54 (15B)*: 3.7 ug/mL (ref >1.3)
Pneumo Ab Type 56 (18C)*: 0.3 ug/mL — ABNORMAL LOW (ref >1.3)
Pneumo Ab Type 57 (19A)*: 1.3 ug/mL — ABNORMAL LOW (ref >1.3)
Pneumo Ab Type 68 (9V)*: 0.7 ug/mL — ABNORMAL LOW (ref >1.3)
Pneumo Ab Type 70 (33F)*: 12.7 ug/mL (ref >1.3)
Pneumo Ab Type 8*: 1.4 ug/mL (ref >1.3)
Pneumo Ab Type 9 (9N)*: 0.6 ug/mL — ABNORMAL LOW (ref >1.3)

## 2022-04-18 LAB — ALLERGENS W/COMP RFLX AREA 2
Alternaria Alternata IgE: <0.1 kU/L
Aspergillus Fumigatus IgE: <0.1 kU/L
Bermuda Grass IgE: 1.11 kU/L — AB
Cedar, Mountain IgE: <0.1 kU/L
Cladosporium Herbarum IgE: <0.1 kU/L
Cockroach, German IgE: 0.13 kU/L — AB
Common Silver Birch IgE: <0.1 kU/L
Cottonwood IgE: <0.1 kU/L
D Farinae IgE: 0.85 kU/L — AB
D Pteronyssinus IgE: 0.73 kU/L — AB
E001-IgE Cat Dander: 0.13 kU/L — AB
E005-IgE Dog Dander: <0.1 kU/L
Elm, American IgE: <0.1 kU/L
IgE (Immunoglobulin E), Serum: 81 IU/mL (ref 6–495)
Johnson Grass IgE: 1.03 kU/L — AB
Maple/Box Elder IgE: <0.1 kU/L
Mouse Urine IgE: <0.1 kU/L
Oak, White IgE: <0.1 kU/L
Pecan, Hickory IgE: <0.1 kU/L
Penicillium Chrysogen IgE: <0.1 kU/L
Pigweed, Rough IgE: <0.1 kU/L
Ragweed, Short IgE: <0.1 kU/L
Sheep Sorrel IgE Qn: <0.1 kU/L
Timothy Grass IgE: 2.18 kU/L — AB
White Mulberry IgE: <0.1 kU/L

## 2022-04-23 ENCOUNTER — Telehealth: Payer: Self-pay

## 2022-04-23 NOTE — Telephone Encounter (Signed)
Patient's wife called back in regards to labs and said that Allen [name] is interested in doing the Antibiotic for 3 days to help reduce infections. Patient is interested but he wanted to remind you that he does have stage 4 kidney disease. Patient and wife said that they would move forward with it if it won't make his kidney disease worse.

## 2022-04-24 MED ORDER — AZITHROMYCIN 500 MG PO TABS: 500.0000 mg | ORAL_TABLET | ORAL | 1 refills | Status: AC

## 2022-04-24 NOTE — Telephone Encounter (Signed)
It is clear by the liver, so that should not be a problem.  I sent in the prescription.

## 2022-04-24 NOTE — Telephone Encounter (Signed)
Called patient - DPR verified - LMOVM of provider notation below.

## 2022-05-31 LAB — LIPID PANEL
Chol/HDL Ratio: 3 ratio (ref 0.0–5.0)
Cholesterol, Total: 168 mg/dL (ref 100–199)
HDL: 56 mg/dL (ref >39)
LDL Chol Calc (NIH): 94 mg/dL (ref 0–99)
Triglycerides: 100 mg/dL (ref 0–149)
VLDL Cholesterol Cal: 18 mg/dL (ref 5–40)

## 2022-05-31 LAB — COMPREHENSIVE METABOLIC PANEL
ALT: 33 IU/L (ref 0–44)
AST: 37 IU/L (ref 0–40)
Albumin/Globulin Ratio: 1.8 (ref 1.2–2.2)
Albumin: 4.1 g/dL (ref 3.8–4.8)
Alkaline Phosphatase: 71 IU/L (ref 44–121)
BUN/Creatinine Ratio: 13 (ref 10–24)
BUN: 41 mg/dL — ABNORMAL HIGH (ref 8–27)
Bilirubin Total: 0.8 mg/dL (ref 0.0–1.2)
CO2: 22 mmol/L (ref 20–29)
Calcium: 9.1 mg/dL (ref 8.6–10.2)
Chloride: 104 mmol/L (ref 96–106)
Creatinine, Ser: 3.24 mg/dL — ABNORMAL HIGH (ref 0.76–1.27)
Globulin, Total: 2.3 g/dL (ref 1.5–4.5)
Glucose: 154 mg/dL — ABNORMAL HIGH (ref 70–99)
Potassium: 4.7 mmol/L (ref 3.5–5.2)
Sodium: 141 mmol/L (ref 134–144)
Total Protein: 6.4 g/dL (ref 6.0–8.5)
eGFR: 20 mL/min/{1.73_m2} — ABNORMAL LOW (ref >59)

## 2022-05-31 LAB — T4, FREE: Free T4: 0.93 ng/dL (ref 0.82–1.77)

## 2022-05-31 LAB — TSH: TSH: 1.98 u[IU]/mL (ref 0.450–4.500)

## 2022-06-05 ENCOUNTER — Encounter: Payer: Self-pay | Admitting: "Endocrinology

## 2022-06-05 ENCOUNTER — Ambulatory Visit (INDEPENDENT_AMBULATORY_CARE_PROVIDER_SITE_OTHER): Payer: Medicare Other | Admitting: "Endocrinology

## 2022-06-05 VITALS — BP 134/76 | HR 64 | Ht 71.0 in | Wt 171.8 lb

## 2022-06-05 DIAGNOSIS — E782 Mixed hyperlipidemia: Secondary | ICD-10-CM | POA: Diagnosis not present

## 2022-06-05 DIAGNOSIS — Z7985 Long-term (current) use of injectable non-insulin antidiabetic drugs: Secondary | ICD-10-CM | POA: Diagnosis not present

## 2022-06-05 DIAGNOSIS — I10 Essential (primary) hypertension: Secondary | ICD-10-CM | POA: Diagnosis not present

## 2022-06-05 DIAGNOSIS — E1165 Type 2 diabetes mellitus with hyperglycemia: Secondary | ICD-10-CM | POA: Diagnosis not present

## 2022-06-05 LAB — POCT GLYCOSYLATED HEMOGLOBIN (HGB A1C): HbA1c, POC (controlled diabetic range): 6.2 % (ref 0.0–7.0)

## 2022-06-05 MED ORDER — TRULICITY 0.75 MG/0.5ML ~~LOC~~ SOAJ: 0.7500 mg | SUBCUTANEOUS | 1 refills | Status: DC

## 2022-06-05 NOTE — Progress Notes (Unsigned)
06/05/2022   Endocrinology follow-up note   Subjective:    Patient ID: Allen [name], male    DOB: 1950-10-06. Patient is being seen in follow-up for the management of type 2 diabetes.    Vasireddy, Daneen Schick, MD  Past Medical History:  Diagnosis Date   Anemia    on iron since gastric bypass   Arthritis    mild   Asthma    Chronic kidney disease    stage 3, now resolved since weight loss   Claustrophobia    Diabetes mellitus, type II (HCC)    type 2 metformin   GERD (gastroesophageal reflux disease)    takes nexium   Hyperlipidemia    Hypertension    Pneumonia 15 yrs ago   SCC (squamous cell carcinoma) 12/29/2007   Left Mid Preauricular(Mod.Diff) (Dr. Adriana Simas)   SCC (squamous cell carcinoma) 12/29/2007   Right Upper Dorsal Nose(Well Diff) (Dr. Adriana Simas)   SCC (squamous cell carcinoma) 12/29/2007   Anti-Tragus Right Ear(Mod.Diff) (Dr. Adriana Simas)   SCC (squamous cell carcinoma) 06/06/2010   Left Nose(in Situ) (curet and 5FU)   SCC (squamous cell carcinoma) 06/06/2010   Right Check(Well Diff) (curet and 5FU)   SCC (squamous cell carcinoma) 07/20/2013   Right Sideburn(KA) (curet and 5FU)   SCC (squamous cell carcinoma) 06/27/2015   Right Neck Sup(in Situ) (curet and 5FU)   SCC (squamous cell carcinoma) 06/27/2015   Right Neck Inf(in Situ) (curet and 5FU)   SCC (squamous cell carcinoma) 01/02/2017   Left Wrist(in Situ) (curet and 5FU)   SCC (squamous cell carcinoma) 01/02/2017   Right Outer Cheek(in Situ) (curet and 5FU)   SCC (squamous cell carcinoma) 06/23/2018   Left Sideburn(in Situ) (curet and 5FU)   SCC (squamous cell carcinoma) 06/23/2018   Left Upper Lip(Well Diff) (MOH's)   SCC (squamous cell carcinoma) 06/23/2018   Right Ear Rim(in Situ) (curet and 5FU)   Sleep apnea    cpap, no cpap since may 2018 weight loss 80 labs    Superficial nodular basal cell carcinoma (BCC) 06/27/2015   Left Rim of Ear (curet and 5FU)   Past Surgical History:  Procedure  Laterality Date   ANTERIOR CERVICAL DECOMP/DISCECTOMY FUSION Right 10/10/2018   Procedure: Right sided revision with hardware removal at Cervical three-four, Cervical four-five Anterior cervical decompression/discectomy/fusion with corpectomy at Cervical five;  Surgeon: Maeola Harman, MD;  Location: [facility] OR;  Service: Neurosurgery;  Laterality: Right;   CARDIAC CATHETERIZATION     patient reports he thinks he had one over 25 years ago   carpal tunnel Bilateral    CERVICAL VERTEBRAE EXCISION     and cleaning   CHOLECYSTECTOMY     cyst removed left hand  01-28-17   ESOPHAGOGASTRODUODENOSCOPY (EGD) WITH PROPOFOL N/A 01/31/2017   Procedure: ESOPHAGOGASTRODUODENOSCOPY (EGD) WITH PROPOFOL;  Surgeon: Ovidio Kin, MD;  Location: [facility];  Service: General;  Laterality: N/A;   EYE SURGERY     bilateral cataract removal   GASTRIC ROUX-EN-Y N/A 06/04/2016   Procedure: LAPAROSCOPIC ROUX-EN-Y GASTRIC BYPASS WITH UPPER ENDOSCOPY;  Surgeon: Berna Bue, MD;  Location: [facility]S;  Service: General;  Laterality: N/A;   NASAL SINUS SURGERY     POSTERIOR CERVICAL FUSION/FORAMINOTOMY N/A 06/08/2019   Procedure: Cervical Three to Cervical Seven Posterior cervical decompression and fusion;  Surgeon: Maeola Harman, MD;  Location: [facility] OR;  Service: Neurosurgery;  Laterality: N/A;  Cervical Three to Cervical Seven Posterior cervical decompression and fusion   RADIOLOGY WITH ANESTHESIA N/A 04/28/2019  Procedure: MRI RADIOLOGY WITH ANESTHESIA;  Surgeon: Radiologist, Medication, MD;  Location: [facility] OR;  Service: Radiology;  Laterality: N/A;   TRIGGER FINGER RELEASE     Social History   Socioeconomic History   Marital status: Married    Spouse name: Not on file   Number of children: Not on file   Years of education: Not on file   Highest education level: Not on file  Occupational History   Not on file  Tobacco Use   Smoking status: Never   Smokeless tobacco: Never  Vaping Use   Vaping Use: Never used   Substance and Sexual Activity   Alcohol use: No   Drug use: No   Sexual activity: Yes  Other Topics Concern   Not on file  Social History Narrative   Not on file   Social Determinants of Health   Financial Resource Strain: Not on file  Food Insecurity: Not on file  Transportation Needs: Not on file  Physical Activity: Not on file  Stress: Not on file  Social Connections: Not on file   Outpatient Encounter Medications as of 06/05/2022  Medication Sig   acetaminophen (TYLENOL) 500 MG tablet Take 1,000 mg by mouth every 8 (eight) hours as needed for mild pain.   albuterol (PROAIR HFA) 108 (90 Base) MCG/ACT inhaler 2 puffs every 4 hours as needed only  if your can't catch your breath   amLODipine (NORVASC) 5 MG tablet Take 10 mg by mouth daily.   azithromycin (ZITHROMAX) 500 MG tablet Take 1 tablet (500 mg total) by mouth every Monday, Wednesday, and Friday.   BREO ELLIPTA 200-25 MCG/ACT AEPB Inhale 1 puff into the lungs daily.   Calcium Carb-Cholecalciferol (CALCIUM 500+D3 PO) Take 1 tablet by mouth 3 (three) times daily.    calcium carbonate (TUMS - DOSED IN MG ELEMENTAL CALCIUM) 500 MG chewable tablet Chew 1-2 tablets by mouth 3 (three) times daily as needed for indigestion or heartburn.   Dulaglutide (TRULICITY) 0.75 MG/0.5ML SOPN Inject 0.75 mg into the skin once a week.   escitalopram (LEXAPRO) 20 MG tablet Take 20 mg by mouth daily.   famotidine (PEPCID) 20 MG tablet One after supper   hydrochlorothiazide (HYDRODIURIL) 12.5 MG tablet Take 12.5 mg by mouth daily.   Magnesium 400 MG CAPS Take 400 mg by mouth daily.    Multiple Vitamin (MULTIVITAMIN WITH MINERALS) TABS tablet Take 1 tablet by mouth daily.   olmesartan (BENICAR) 40 MG tablet Take 40 mg by mouth daily.   pantoprazole (PROTONIX) 40 MG tablet Take 1 tablet (40 mg total) by mouth daily. Take 30-60 min before first meal of the day   [DISCONTINUED] Dulaglutide (TRULICITY) 0.75 MG/0.5ML SOPN Inject 0.75 mg into the skin  once a week.   Facility-Administered Encounter Medications as of 06/05/2022  Medication   dupilumab (DUPIXENT) prefilled syringe 300 mg   ALLERGIES: Allergies  Allergen Reactions   Baclofen Anxiety and Other (See Comments)    Alters mental state   VACCINATION STATUS: Immunization History  Administered Date(s) Administered   Influenza, High Dose Seasonal PF 10/19/2017, 10/29/2018   Influenza-Unspecified 10/22/2017   Moderna Sars-Covid-2 Vaccination 02/14/2019, 09/16/2019, 05/02/2020   Tdap 12/23/2017   Zoster Recombinat (Shingrix) 12/23/2017    Diabetes He presents for his follow-up diabetic visit. He has type 2 diabetes mellitus. Onset time: He was diagnosed at approximate age of 45 years. His disease course has been stable. There are no hypoglycemic associated symptoms. Pertinent negatives for hypoglycemia include no confusion, headaches, pallor  or seizures. Pertinent negatives for diabetes include no chest pain, no polydipsia, no polyphagia, no polyuria and no weakness. There are no hypoglycemic complications. Symptoms are stable. Diabetic complications include nephropathy. Risk factors for coronary artery disease include diabetes mellitus, dyslipidemia, hypertension and male sex. His weight is decreasing steadily (He is status post Roux-en-Y gastric bypass-after successfully losing 135 pounds.). He is following a diabetic diet. When asked about meal planning, he reported none. He has not had a previous visit with a dietitian. He participates in exercise intermittently (He participates in golfing activities.). His home blood glucose trend is decreasing steadily. Allen [name] presents with A1c of 6.2%. -He is currently on Trulicity 0.75 mg subcutaneously weekly.  He has no hypoglycemia.  He maintained 135 pounds weight loss after his surgery.  He is status post Roux-en-Y surgery. ) An ACE inhibitor/angiotensin II receptor blocker is being taken. Eye exam is current.  Hyperlipidemia This is a  chronic problem. The current episode started more than 1 year ago. The problem is uncontrolled. Exacerbating diseases include chronic renal disease and diabetes. He has no history of obesity. Pertinent negatives include no chest pain, myalgias or shortness of breath. Current antihyperlipidemic treatment includes statins and bile acid squestrants. Compliance problems include adherence to diet.  Risk factors for coronary artery disease include diabetes mellitus, dyslipidemia, hypertension, male sex, obesity and a sedentary lifestyle.  Hypertension This is a chronic problem. The current episode started more than 1 year ago. The problem is uncontrolled. Pertinent negatives include no chest pain, headaches, neck pain, palpitations or shortness of breath. Risk factors for coronary artery disease include diabetes mellitus, dyslipidemia and sedentary lifestyle. Past treatments include angiotensin blockers. Hypertensive end-organ damage includes kidney disease. Identifiable causes of hypertension include chronic renal disease.       Objective:    BP 134/76   Pulse 64   Ht 5\' 11"  (1.803 m)   Wt 171 lb 12.8 oz (77.9 kg)   BMI 23.96 kg/m   Wt Readings from Last 3 Encounters:  06/05/22 171 lb 12.8 oz (77.9 kg)  04/11/22 172 lb (78 kg)  02/14/22 174 lb (78.9 kg)     Recent Results (from the past 2160 hour(s))  Allergens w/Comp Rflx Area 2     Status: Abnormal   Collection Time: 04/11/22  2:06 PM  Result Value Ref Range   Class Description Allergens Comment     Comment:     Levels of Specific IgE       Class  Description of Class     ---------------------------  -----  --------------------                    < 0.10         0         Negative            0.10 -    0.31         0/I       Equivocal/Low            0.32 -    0.55         I         Low            0.56 -    1.40         II        Moderate            1.41 -    3.90  III       High            3.91 -   19.00         IV        Very  High           19.01 -  100.00         V         Very High                   >100.00         VI        Very High    IgE (Immunoglobulin E), Serum 81 6 - 495 IU/mL   D Pteronyssinus IgE 0.73 (A) Class II kU/L   D Farinae IgE 0.85 (A) Class II kU/L   E001-IgE Cat Dander 0.13 (A) Class 0/I kU/L   E005-IgE Dog Dander <0.10 Class 0 kU/L   [place] Grass IgE 1.11 (A) Class II kU/L   Timothy Grass IgE 2.18 (A) Class III kU/L   Johnson Grass IgE 1.03 (A) Class II kU/L   Cockroach, German IgE 0.13 (A) Class 0/I kU/L   Penicillium Chrysogen IgE <0.10 Class 0 kU/L   Cladosporium Herbarum IgE <0.10 Class 0 kU/L   Aspergillus Fumigatus IgE <0.10 Class 0 kU/L   Alternaria Alternata IgE <0.10 Class 0 kU/L   Maple/Box Elder IgE <0.10 Class 0 kU/L   Common Silver Charletta Cousin IgE <0.10 Class 0 kU/L   Cedar, [place] IgE <0.10 Class 0 kU/L   Oak, White IgE <0.10 Class 0 kU/L   Elm, American IgE <0.10 Class 0 kU/L   Cottonwood IgE <0.10 Class 0 kU/L   Pecan, Hickory IgE <0.10 Class 0 kU/L   White Mulberry IgE <0.10 Class 0 kU/L   Ragweed, Short IgE <0.10 Class 0 kU/L   Pigweed, Rough IgE <0.10 Class 0 kU/L   Sheep Sorrel IgE Qn <0.10 Class 0 kU/L   Mouse Urine IgE <0.10 Class 0 kU/L  Strep pneumoniae 23 Serotypes IgG     Status: Abnormal   Collection Time: 04/11/22  2:06 PM  Result Value Ref Range   Pneumo Ab Type 1* 0.9 (L) >1.3 ug/mL   Pneumo Ab Type 3* 0.3 (L) >1.3 ug/mL   Pneumo Ab Type 4* 0.3 (L) >1.3 ug/mL   Pneumo Ab Type 8* 1.4 >1.3 ug/mL   Pneumo Ab Type 9 (9N)* 0.6 (L) >1.3 ug/mL   Pneumo Ab Type 12 (60F)* 0.6 (L) >1.3 ug/mL   Pneumo Ab Type 14* 5.7 >1.3 ug/mL   Pneumo Ab Type 17 (30F)* 1.5 >1.3 ug/mL   Pneumo Ab Type 19 (62F)* 1.9 >1.3 ug/mL   Pneumo Ab Type 2* 2.5 >1.3 ug/mL   Pneumo Ab Type 20* 1.0 (L) >1.3 ug/mL   Pneumo Ab Type 22 (15F)* 7.4 >1.3 ug/mL   Pneumo Ab Type 23 (56F)* 0.1 (L) >1.3 ug/mL   Pneumo Ab Type 26 (6B)* <0.1 (L) >1.3 ug/mL   Pneumo Ab Type 34 (10A)* 2.9 >1.3  ug/mL   Pneumo Ab Type 43 (11A)* 0.1 (L) >1.3 ug/mL   Pneumo Ab Type 5* 0.4 (L) >1.3 ug/mL   Pneumo Ab Type 51 (46F)* 0.5 (L) >1.3 ug/mL   Pneumo Ab Type 54 (15B)* 3.7 >1.3 ug/mL   Pneumo Ab Type 56 (18C)* 0.3 (L) >1.3 ug/mL   Pneumo Ab Type 57 (19A)* 1.3 (L) >1.3 ug/mL   Pneumo Ab Type 68 (9V)* 0.7 (L) >1.3  ug/mL   Pneumo Ab Type 70 (32F)* 12.7 >1.3 ug/mL    Comment: *This test was developed and its performance characteristics determined by [employer]. It has not been cleared or approved by the U.S. Food and Drug Administration.  FLAG Interpretation: A = Abnormal, H = High, L = Low   Comprehensive metabolic panel     Status: Abnormal   Collection Time: 05/30/22  8:57 AM  Result Value Ref Range   Glucose 154 (H) 70 - 99 mg/dL   BUN 41 (H) 8 - 27 mg/dL   Creatinine, Ser 8.65 (H) 0.76 - 1.27 mg/dL   eGFR 20 (L) >78 IO/NGE/9.52   BUN/Creatinine Ratio 13 10 - 24   Sodium 141 134 - 144 mmol/L   Potassium 4.7 3.5 - 5.2 mmol/L   Chloride 104 96 - 106 mmol/L   CO2 22 20 - 29 mmol/L   Calcium 9.1 8.6 - 10.2 mg/dL   Total Protein 6.4 6.0 - 8.5 g/dL   Albumin 4.1 3.8 - 4.8 g/dL   Globulin, Total 2.3 1.5 - 4.5 g/dL   Albumin/Globulin Ratio 1.8 1.2 - 2.2   Bilirubin Total 0.8 0.0 - 1.2 mg/dL   Alkaline Phosphatase 71 44 - 121 IU/L   AST 37 0 - 40 IU/L   ALT 33 0 - 44 IU/L  Lipid panel     Status: None   Collection Time: 05/30/22  8:57 AM  Result Value Ref Range   Cholesterol, Total 168 100 - 199 mg/dL   Triglycerides 841 0 - 149 mg/dL   HDL 56 >32 mg/dL   VLDL Cholesterol Cal 18 5 - 40 mg/dL   LDL Chol Calc (NIH) 94 0 - 99 mg/dL   Chol/HDL Ratio 3.0 0.0 - 5.0 ratio    Comment:                                   T. Chol/HDL Ratio                                             Men  Women                               1/2 Avg.Risk  3.4    3.3                                   Avg.Risk  5.0    4.4                                2X Avg.Risk  9.6    7.1                                 3X Avg.Risk 23.4   11.0   TSH     Status: None   Collection Time: 05/30/22  8:57 AM  Result Value Ref Range   TSH 1.980 0.450 - 4.500 uIU/mL  T4, free     Status: None   Collection Time: 05/30/22  8:57 AM  Result Value Ref Range  Free T4 0.93 0.82 - 1.77 ng/dL  HgB Z6X     Status: None   Collection Time: 06/05/22 10:24 AM  Result Value Ref Range   Hemoglobin A1C     HbA1c POC (<> result, manual entry)     HbA1c, POC (prediabetic range)     HbA1c, POC (controlled diabetic range) 6.2 0.0 - 7.0 %   Lipid Panel     Component Value Date/Time   CHOL 168 05/30/2022 0857   TRIG 100 05/30/2022 0857   HDL 56 05/30/2022 0857   CHOLHDL 3.0 05/30/2022 0857   CHOLHDL 3.9 04/25/2016 1119   VLDL 27 04/25/2016 1119   LDLCALC 94 05/30/2022 0857     Assessment & Plan:   1.  type 2 diabetes mellitus  - Patient has currently controlled asymptomatic type 2 DM since  72 years of age.  Patient is status post Roux-en-Y gastric bypass.  Allen [name] presents with A1c of 6.2%. -He is currently on Trulicity 0.75 mg subcutaneously weekly.  He has no hypoglycemia.  He maintained 135 pounds weight loss after his surgery.  He is status post Roux-en-Y surgery.   - His diabetes is complicated by mild  CKD  and patient remains at a high risk for more acute and chronic complications of diabetes which include CAD, CVA, CKD, retinopathy, and neuropathy. These are all discussed in detail with the patient.  - I have counseled the patient on diet management and weight loss, by adopting a carbohydrate restricted/protein rich diet.  - he acknowledges that there is a room for improvement in his food and drink choices. - Suggestion is made for him to avoid simple carbohydrates  from his diet including Cakes, Sweet Desserts, Ice Cream, Soda (diet and regular), Sweet Tea, Candies, Chips, Cookies, Store Bought Juices, Alcohol , Artificial Sweeteners,  Coffee Creamer, and "Sugar-free" Products, Lemonade. This will help  patient to have more stable blood glucose profile and potentially avoid unintended weight gain.   The following Lifestyle Medicine recommendations according to American College of Lifestyle Medicine  [facility]) were discussed and and offered to patient and he  agrees to start the journey:  A. Whole Foods, Plant-Based Nutrition comprising of fruits and vegetables, plant-based proteins, whole-grain carbohydrates was discussed in detail with the patient.   A list for source of those nutrients were also provided to the patient.  Patient will use only water or unsweetened tea for hydration. B.  The need to stay away from risky substances including alcohol, smoking; obtaining 7 to 9 hours of restorative sleep, at least 150 minutes of moderate intensity exercise weekly, the importance of healthy social connections,  and stress management techniques were discussed. C.  A full color page of  Calorie density of various food groups per pound showing examples of each food groups was provided to the patient.  - I encouraged the patient to switch to  unprocessed or minimally processed complex starch and increased protein intake (animal or plant source), fruits, and vegetables.  - Patient is advised to stick to a routine mealtimes to eat 3 meals  a day and avoid unnecessary snacks ( to snack only to correct hypoglycemia).   - I have approached patient with the following individualized plan to manage diabetes and patient agrees:   -He has tolerated and benefiting low-dose GLP-1 receptor agonists. He is advised to continue Trulicity 0.75 mg subcutaneously weekly.   - Patient specific target  A1c;  LDL, HDL, Triglycerides,  were discussed in detail.  2)  BP/HTN: -His blood pressure is controlled to target.  He remains on amlodipine 5 mg p.o. daily, Benicar 40 mg p.o. daily.  Whole food plant-based diet discussed above will help with blood pressure.  He wishes to address his blood pressure management with his  nephrologist.    3) Lipids/HPL: Recent lipid panel showed LDL at 94, dropping from 123.  He is not on statins, would like to address it with his PMD.      4)  Weight/Diet: Status post Roux-en-Y gastric bypass, successfully lost up to 135 pounds.   His current weight is optimal for him.  CDE Consult in progress , exercise, and detailed carbohydrates information provided.  5) Chronic Care/Health Maintenance:  -Patient is on ACEI/ARB and Statin medications and encouraged to continue to follow up with Ophthalmology, Podiatrist at least yearly or according to recommendations, and advised to   stay away from smoking. I have recommended yearly flu vaccine and pneumonia vaccination at least every 5 years; moderate intensity exercise for up to 150 minutes weekly; and  sleep for at least 7 hours a day. -He is advised to get his colonoscopy through GI.  His constipation is likely due to his pain medications.   His screening ABI was negative for PAD in March 2022.  This study will be repeated in March 2027, or sooner if needed.  - I advised patient to maintain close follow up with Vasireddy, Daneen Schick, MD for primary care needs.   I spent  26 minutes in the care of the patient today including review of labs from CMP, Lipids, Thyroid Function, Hematology (current and previous including abstractions from other facilities); face-to-face time discussing  his blood glucose readings/logs, discussing hypoglycemia and hyperglycemia episodes and symptoms, medications doses, his options of short and long term treatment based on the latest standards of care / guidelines;  discussion about incorporating lifestyle medicine;  and documenting the encounter. Risk reduction counseling performed per USPSTF guidelines to reduce  obesity and cardiovascular risk factors.     Please refer to Patient Instructions for Blood Glucose Monitoring and Insulin/Medications Dosing Guide"  in media tab for additional information.  Please  also refer to " Patient Self Inventory" in the Media  tab for reviewed elements of pertinent patient history.  Allen [name] participated in the discussions, expressed understanding, and voiced agreement with the above plans.  All questions were answered to his satisfaction. he is encouraged to contact clinic should he have any questions or concerns prior to his return visit.     Follow up plan: In 6 months with labs. Allen Lunch, MD Phone: [phone]  Fax: [phone]  -  This note was partially dictated with voice recognition software. Similar sounding words can be transcribed inadequately or may not  be corrected upon review.  06/05/2022, 12:44 PM

## 2022-06-06 ENCOUNTER — Other Ambulatory Visit: Payer: Self-pay

## 2022-06-06 ENCOUNTER — Ambulatory Visit (INDEPENDENT_AMBULATORY_CARE_PROVIDER_SITE_OTHER): Payer: Medicare Other | Admitting: Allergy & Immunology

## 2022-06-06 ENCOUNTER — Encounter: Payer: Self-pay | Admitting: Allergy & Immunology

## 2022-06-06 ENCOUNTER — Ambulatory Visit: Payer: Medicare Other | Admitting: Allergy & Immunology

## 2022-06-06 VITALS — BP 122/82 | HR 68 | Temp 98.4°F | Resp 18 | Ht 70.0 in | Wt 171.8 lb

## 2022-06-06 DIAGNOSIS — R053 Chronic cough: Secondary | ICD-10-CM

## 2022-06-06 DIAGNOSIS — B999 Unspecified infectious disease: Secondary | ICD-10-CM | POA: Diagnosis not present

## 2022-06-06 DIAGNOSIS — J4550 Severe persistent asthma, uncomplicated: Secondary | ICD-10-CM | POA: Diagnosis not present

## 2022-06-06 MED ORDER — TRELEGY ELLIPTA 200-62.5-25 MCG/ACT IN AEPB: 1.0000 | INHALATION_SPRAY | Freq: Every morning | RESPIRATORY_TRACT | 2 refills | Status: DC

## 2022-06-06 NOTE — Progress Notes (Signed)
FOLLOW UP  Date of Service/Encounter:  06/06/22   Assessment:   Moderate persistent asthma, uncomplicated - with worsening symptoms since transitioning from Xolair to Dupixent   Persistent/worsening sputum production - consider sinus and/or chest CT   Recurrent infections - with inadequate protection against Streptococcus pneumonia (getting repeat titers)   Consider prophylactic antibiotics?    Chronic rhinitis - previously on allergen immunotherapy through someone in [place] in the early 1990s    S/p gastric bypass in 2018 and lost 100+ pounds (and has kept it off)   Type 2 diabetes mellitus - controlled with Trulicity once weekly (sees Dr. Fransico Him)    GERD - on Protonix   Never smoker   Allen [name] is doing very well with current regimen.  He is going to see cardiology next week and hopefully we can get an EKG at that time.  We did discuss long-term implications of using azithromycin including prolonged QT, but hopefully his EKG is within normal limits.  We are not going to pursue allergen immunotherapy since he has been so stable.  We recommend any changes at this time.    Plan/Recommendations:   1. Moderate persistent asthma, uncomplicated - Lung testing not done today. - Daily controller medication(s): Trelegy 200/62.5/25 one puff once daily + Dupixent every two weeks - Prior to physical activity: albuterol 2 puffs 10-15 minutes before physical activity. - Rescue medications: albuterol 4 puffs every 4-6 hours as needed - Asthma control goals:  * Full participation in all desired activities (may need albuterol before activity) * Albuterol use two time or less a week on average (not counting use with activity) * Cough interfering with sleep two time or less a month * Oral steroids no more than once a year * No hospitalizations  2. Recurrent infections - Continue with azithromycin 500mg  three times weekly. - This is working so well.   3. Chronic rhinitis (grasses,  cat, dust mites, cockroach) - We can always pursue allergy shots in the future if needed.   4. Return in about 6 months (around 12/07/2022).   Subjective:   Allen [name] is a 72 y.o. male presenting today for follow up of  Chief Complaint  Patient presents with   Asthma    Says it is much better.    Immunotherapy    Dupixent is doing well.     Allen [name] has a history of the following: Patient Active Problem List   Diagnosis Date Noted   Chronic asthma, moderate persistent, uncomplicated 11/11/2019   Cervical pseudoarthrosis (HCC) 06/08/2019   Vitamin D deficiency 05/19/2019   Herniated cervical disc without myelopathy 10/10/2018   Uncontrolled type 2 diabetes mellitus with hyperglycemia (HCC) 04/22/2015   Mixed hyperlipidemia 04/22/2015   Essential hypertension, benign 04/22/2015   Obesity 04/22/2015    History obtained from: chart review and patient and his wife .  Allen [name] is a 72 y.o. male presenting for a follow up visit.  We last saw him in March 2024.  At that time, would like testing was not done as it was normal last time.  We gave him a sample of Trelegy to use the trip.  Breo.  For his history of recurrent infections, we obtained repeat strep titers.  For his rhinitis, we got repeat testing via the blood.  His titers showed that he did not respond to the Pneumovax.  We recommended starting azithromycin 500 mg 3 times a week.  He did have environmental allergies to dust mites as well  as cat, grasses, and cockroach.  Since last visit, he has done very well.  He is on the azithromycin 3 times a week which seems to be keeping his infections under control.  The cough is 99% better.  He has not had any sinus infections or other infections since starting the prophylactic antibiotics.  They are very happy with how he is doing.  They have thought about doing allergy shots, but with the azithromycin working so well they would like to hold off.  Of note, he does see  Dr. Daryel November annually for cardiology checkup.  He is seeing Dr. Hyacinth Meeker at the end of the month.  He has had a normal EKG.  He remains on the Trelegy 1 puff once daily.  This is working well to control his breathing.  He has not been using albuterol at all.  He has not been on prednisone nor is he needed to go to the emergency room for breathing.  He did end up going back to work. 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 The person who took his placed worked for 7.5 years. His replacement dropped dead from an MI. So they called [place] back to work until the found a replacement. He is going to work through June and maybe a bit longer.   Otherwise, there have been no changes to his past medical history, surgical history, family history, or social history.    Review of Systems  Constitutional: Negative.  Negative for chills, fever, malaise/fatigue and weight loss.  HENT:  Negative for congestion, ear discharge, ear pain, sinus pain and sore throat.   Eyes:  Negative for photophobia, pain, discharge and redness.  Respiratory:  Negative for cough, sputum production, shortness of breath and wheezing.   Cardiovascular: Negative.  Negative for chest pain and palpitations.  Gastrointestinal:  Negative for abdominal pain, constipation, diarrhea, heartburn, nausea and vomiting.  Skin: Negative.  Negative for itching and rash.  Neurological:  Negative for dizziness and headaches.  Endo/Heme/Allergies:  Negative for environmental allergies. Does not bruise/bleed easily.       Objective:   Blood pressure 122/82, pulse 68, temperature 98.4 F (36.9 C), temperature source Temporal, resp. rate 18, height 5\' 10"  (1.778 m), weight 171 lb 12.8 oz (77.9 kg), SpO2 98 %. Body mass index is 24.65 kg/m.    Physical Exam Vitals reviewed.  Constitutional:      Appearance: He is well-developed.     Comments: Pleasant.  Cooperative with exam.  HENT:     Head:  Normocephalic and atraumatic.     Right Ear: Tympanic membrane, ear canal and external ear normal. No drainage, swelling or tenderness. Tympanic membrane is not injected, scarred, erythematous, retracted or bulging.     Left Ear: Tympanic membrane, ear canal and external ear normal. No drainage, swelling or tenderness. Tympanic membrane is not injected, scarred, erythematous, retracted or bulging.     Nose: No nasal deformity, septal deviation, mucosal edema or rhinorrhea.     Right Turbinates: Enlarged, swollen and pale.     Left Turbinates: Enlarged, swollen and pale.     Right Sinus: No maxillary sinus tenderness or frontal sinus tenderness.     Left Sinus: No maxillary sinus tenderness or frontal sinus tenderness.     Comments: No new polyps.    Mouth/Throat:     Lips: Pink.     Mouth: Mucous membranes are moist. Mucous membranes are not pale and not dry.  Pharynx: Uvula midline.     Comments: Cobblestoning.  Eyes:     General: Lids are normal. Allergic shiner present. No visual field deficit.       Right eye: No discharge.        Left eye: No discharge.     Conjunctiva/sclera: Conjunctivae normal.     Right eye: Right conjunctiva is not injected. No chemosis.    Left eye: Left conjunctiva is not injected. No chemosis.    Pupils: Pupils are equal, round, and reactive to light.  Cardiovascular:     Rate and Rhythm: Normal rate and regular rhythm.     Heart sounds: Normal heart sounds.  Pulmonary:     Effort: Pulmonary effort is normal. No tachypnea, accessory muscle usage or respiratory distress.     Breath sounds: Normal breath sounds. No wheezing, rhonchi or rales.  Chest:     Chest wall: No tenderness.  Lymphadenopathy:     Head:     Right side of head: No submandibular, tonsillar or occipital adenopathy.     Left side of head: No submandibular, tonsillar or occipital adenopathy.     Cervical: No cervical adenopathy.  Skin:    Coloration: Skin is not pale.     Findings:  No abrasion, erythema, petechiae or rash. Rash is not papular, urticarial or vesicular.  Neurological:     Mental Status: He is alert.  Psychiatric:        Behavior: Behavior is cooperative.      Diagnostic studies: none       Malachi Bonds, MD  [facility]

## 2022-06-06 NOTE — Patient Instructions (Addendum)
1. Moderate persistent asthma, uncomplicated - Lung testing not done today. - Daily controller medication(s): Trelegy 200/62.5/25 one puff once daily + Dupixent every two weeks - Prior to physical activity: albuterol 2 puffs 10-15 minutes before physical activity. - Rescue medications: albuterol 4 puffs every 4-6 hours as needed - Asthma control goals:  * Full participation in all desired activities (may need albuterol before activity) * Albuterol use two time or less a week on average (not counting use with activity) * Cough interfering with sleep two time or less a month * Oral steroids no more than once a year * No hospitalizations  2. Recurrent infections - Continue with azithromycin 500mg  three times weekly. - This is working so well.   3. Chronic rhinitis (grasses, cat, dust mites, cockroach) - We can always pursue allergy shots in the future if needed.   4. Return in about 6 months (around 12/07/2022).    Please inform us of any Emergency Department visits, hospitalizations, or changes in symptoms. Call us before going to the ED for breathing or allergy symptoms since we might be able to fit you in for a sick visit. Feel free to contact us anytime with any questions, problems, or concerns.  It was a pleasure to see you guys today!   Websites that have reliable patient information: 1. American Academy of Asthma, Allergy, and Immunology: www.aaaai.org 2. Food Allergy Research and Education (FARE): foodallergy.org 3. Mothers of Asthmatics: http://www.asthmacommunitynetwork.org 4. American College of Allergy, Asthma, and Immunology: www.acaai.org   COVID-19 Vaccine Information can be found at: [email or web address].[email or web address] For questions related to vaccine distribution or appointments, please email [email or web address] or call [phone].   We realize that you might be concerned about having an allergic reaction to the COVID19  vaccines. To help with that concern, WE ARE OFFERING THE COVID19 VACCINES IN OUR OFFICE! Ask the front desk for dates!     "Like" [name] on Facebook and Instagram for our latest updates!      A healthy democracy works best when [employer] participate! Make sure you are registered to vote! If you have moved or changed any of your contact information, you will need to get this updated before voting!  In some cases, you MAY be able to register to vote online: [email or web address]

## 2022-06-13 ENCOUNTER — Ambulatory Visit: Payer: Medicare Other | Admitting: Allergy & Immunology

## 2022-07-09 ENCOUNTER — Encounter (HOSPITAL_COMMUNITY): Payer: Self-pay

## 2022-07-10 ENCOUNTER — Inpatient Hospital Stay (HOSPITAL_COMMUNITY): Payer: Medicare Other | Admitting: Anesthesiology

## 2022-07-10 ENCOUNTER — Other Ambulatory Visit: Payer: Self-pay

## 2022-07-10 ENCOUNTER — Encounter (HOSPITAL_COMMUNITY): Payer: Self-pay

## 2022-07-10 ENCOUNTER — Encounter (HOSPITAL_COMMUNITY): Admission: RE | Disposition: A | Discharge status: Home / Self Care | Payer: Self-pay | Source: Ambulatory Visit

## 2022-07-10 ENCOUNTER — Inpatient Hospital Stay (HOSPITAL_COMMUNITY)
Admission: RE | Admit: 2022-07-10 | Discharge: 2022-07-13 | DRG: 329 | Disposition: A | Discharge status: Home / Self Care | Payer: Medicare Other | Source: Ambulatory Visit | Attending: Surgery | Admitting: Surgery

## 2022-07-10 DIAGNOSIS — I129 Hypertensive chronic kidney disease with stage 1 through stage 4 chronic kidney disease, or unspecified chronic kidney disease: Secondary | ICD-10-CM | POA: Diagnosis present

## 2022-07-10 DIAGNOSIS — K469 Unspecified abdominal hernia without obstruction or gangrene: Secondary | ICD-10-CM | POA: Diagnosis not present

## 2022-07-10 DIAGNOSIS — Z9049 Acquired absence of other specified parts of digestive tract: Secondary | ICD-10-CM | POA: Diagnosis not present

## 2022-07-10 DIAGNOSIS — E785 Hyperlipidemia, unspecified: Secondary | ICD-10-CM | POA: Diagnosis present

## 2022-07-10 DIAGNOSIS — M1990 Unspecified osteoarthritis, unspecified site: Secondary | ICD-10-CM | POA: Diagnosis present

## 2022-07-10 DIAGNOSIS — D631 Anemia in chronic kidney disease: Secondary | ICD-10-CM | POA: Diagnosis not present

## 2022-07-10 DIAGNOSIS — Z981 Arthrodesis status: Secondary | ICD-10-CM | POA: Diagnosis not present

## 2022-07-10 DIAGNOSIS — K562 Volvulus: Secondary | ICD-10-CM | POA: Diagnosis present

## 2022-07-10 DIAGNOSIS — F40240 Claustrophobia: Secondary | ICD-10-CM | POA: Diagnosis present

## 2022-07-10 DIAGNOSIS — Z79899 Other long term (current) drug therapy: Secondary | ICD-10-CM | POA: Diagnosis not present

## 2022-07-10 DIAGNOSIS — Z7951 Long term (current) use of inhaled steroids: Secondary | ICD-10-CM | POA: Diagnosis not present

## 2022-07-10 DIAGNOSIS — Z881 Allergy status to other antibiotic agents status: Secondary | ICD-10-CM

## 2022-07-10 DIAGNOSIS — E872 Acidosis, unspecified: Secondary | ICD-10-CM | POA: Diagnosis present

## 2022-07-10 DIAGNOSIS — N189 Chronic kidney disease, unspecified: Secondary | ICD-10-CM

## 2022-07-10 DIAGNOSIS — E1122 Type 2 diabetes mellitus with diabetic chronic kidney disease: Secondary | ICD-10-CM

## 2022-07-10 DIAGNOSIS — Z5331 Laparoscopic surgical procedure converted to open procedure: Secondary | ICD-10-CM

## 2022-07-10 DIAGNOSIS — Z85828 Personal history of other malignant neoplasm of skin: Secondary | ICD-10-CM | POA: Diagnosis not present

## 2022-07-10 DIAGNOSIS — N184 Chronic kidney disease, stage 4 (severe): Secondary | ICD-10-CM | POA: Diagnosis present

## 2022-07-10 DIAGNOSIS — Z7985 Long-term (current) use of injectable non-insulin antidiabetic drugs: Secondary | ICD-10-CM | POA: Diagnosis not present

## 2022-07-10 DIAGNOSIS — Z8249 Family history of ischemic heart disease and other diseases of the circulatory system: Secondary | ICD-10-CM | POA: Diagnosis not present

## 2022-07-10 DIAGNOSIS — R188 Other ascites: Secondary | ICD-10-CM | POA: Diagnosis present

## 2022-07-10 DIAGNOSIS — Z9884 Bariatric surgery status: Secondary | ICD-10-CM

## 2022-07-10 DIAGNOSIS — K460 Unspecified abdominal hernia with obstruction, without gangrene: Principal | ICD-10-CM | POA: Diagnosis present

## 2022-07-10 DIAGNOSIS — K219 Gastro-esophageal reflux disease without esophagitis: Secondary | ICD-10-CM | POA: Diagnosis present

## 2022-07-10 HISTORY — PX: LAPAROTOMY: SHX154

## 2022-07-10 HISTORY — PX: LAPAROSCOPY: SHX197

## 2022-07-10 LAB — COMPREHENSIVE METABOLIC PANEL
ALT: 25 U/L (ref 0–44)
AST: 25 U/L (ref 15–41)
Albumin: 4.1 g/dL (ref 3.5–5.0)
Alkaline Phosphatase: 59 U/L (ref 38–126)
Anion gap: 13 (ref 5–15)
BUN: 41 mg/dL — ABNORMAL HIGH (ref 8–23)
CO2: 18 mmol/L — ABNORMAL LOW (ref 22–32)
Calcium: 8.9 mg/dL (ref 8.9–10.3)
Chloride: 107 mmol/L (ref 98–111)
Creatinine, Ser: 3.04 mg/dL — ABNORMAL HIGH (ref 0.61–1.24)
GFR, Estimated: 21 mL/min — ABNORMAL LOW (ref >60)
Glucose, Bld: 208 mg/dL — ABNORMAL HIGH (ref 70–99)
Potassium: 4.4 mmol/L (ref 3.5–5.1)
Sodium: 138 mmol/L (ref 135–145)
Total Bilirubin: 1 mg/dL (ref 0.3–1.2)
Total Protein: 7.6 g/dL (ref 6.5–8.1)

## 2022-07-10 LAB — TYPE AND SCREEN
ABO/RH(D): O POS
Antibody Screen: NEGATIVE

## 2022-07-10 LAB — MAGNESIUM: Magnesium: 1.8 mg/dL (ref 1.7–2.4)

## 2022-07-10 LAB — GLUCOSE, CAPILLARY
Glucose-Capillary: 204 mg/dL — ABNORMAL HIGH (ref 70–99)
Glucose-Capillary: 173 mg/dL — ABNORMAL HIGH (ref 70–99)
Glucose-Capillary: 179 mg/dL — ABNORMAL HIGH (ref 70–99)

## 2022-07-10 LAB — CBC WITH DIFFERENTIAL/PLATELET
Abs Immature Granulocytes: 0.06 10*3/uL (ref 0.00–0.07)
Basophils Absolute: 0 10*3/uL (ref 0.0–0.1)
Basophils Relative: 0 %
Eosinophils Absolute: 0 10*3/uL (ref 0.0–0.5)
Eosinophils Relative: 0 %
HCT: 38.8 % — ABNORMAL LOW (ref 39.0–52.0)
Hemoglobin: 12.5 g/dL — ABNORMAL LOW (ref 13.0–17.0)
Immature Granulocytes: 0 %
Lymphocytes Relative: 6 %
Lymphs Abs: 0.9 10*3/uL (ref 0.7–4.0)
MCH: 28.3 pg (ref 26.0–34.0)
MCHC: 32.2 g/dL (ref 30.0–36.0)
MCV: 88 fL (ref 80.0–100.0)
Monocytes Absolute: 0.6 10*3/uL (ref 0.1–1.0)
Monocytes Relative: 4 %
Neutro Abs: 12.6 10*3/uL — ABNORMAL HIGH (ref 1.7–7.7)
Neutrophils Relative %: 90 %
Platelets: 161 10*3/uL (ref 150–400)
RBC: 4.41 MIL/uL (ref 4.22–5.81)
RDW: 14.2 % (ref 11.5–15.5)
WBC: 14 10*3/uL — ABNORMAL HIGH (ref 4.0–10.5)
nRBC: 0 % (ref 0.0–0.2)

## 2022-07-10 LAB — PHOSPHORUS: Phosphorus: 4.3 mg/dL (ref 2.5–4.6)

## 2022-07-10 LAB — LACTIC ACID, PLASMA
Lactic Acid, Venous: 3.3 mmol/L (ref 0.5–1.9)
Lactic Acid, Venous: 2.4 mmol/L (ref 0.5–1.9)

## 2022-07-10 SURGERY — LAPAROSCOPY, DIAGNOSTIC
Anesthesia: General

## 2022-07-10 MED ORDER — ONDANSETRON HCL 4 MG/2ML IJ SOLN
4.0000 mg | Freq: Four times a day (QID) | INTRAMUSCULAR | Status: DC | PRN
  Administered 2022-07-10: 4 mg via INTRAVENOUS
  Filled 2022-07-10: qty 2

## 2022-07-10 MED ORDER — PROPOFOL 10 MG/ML IV BOLUS
INTRAVENOUS | Status: DC | PRN
  Administered 2022-07-10: 130 mg via INTRAVENOUS

## 2022-07-10 MED ORDER — PROPOFOL 10 MG/ML IV BOLUS
INTRAVENOUS | Status: AC
  Filled 2022-07-10: qty 20

## 2022-07-10 MED ORDER — ENOXAPARIN SODIUM 30 MG/0.3ML IJ SOSY
30.0000 mg | PREFILLED_SYRINGE | INTRAMUSCULAR | Status: DC
  Administered 2022-07-11 – 2022-07-13 (×3): 30 mg via SUBCUTANEOUS
  Filled 2022-07-10 (×3): qty 0.3

## 2022-07-10 MED ORDER — SUCCINYLCHOLINE CHLORIDE 200 MG/10ML IV SOSY
PREFILLED_SYRINGE | INTRAVENOUS | Status: DC | PRN
  Administered 2022-07-10: 100 mg via INTRAVENOUS

## 2022-07-10 MED ORDER — PROCHLORPERAZINE EDISYLATE 10 MG/2ML IJ SOLN
5.0000 mg | Freq: Four times a day (QID) | INTRAMUSCULAR | Status: DC | PRN
  Administered 2022-07-10: 10 mg via INTRAVENOUS
  Filled 2022-07-10: qty 2

## 2022-07-10 MED ORDER — 0.9 % SODIUM CHLORIDE (POUR BTL) OPTIME
TOPICAL | Status: DC | PRN
  Administered 2022-07-10: 2000 mL

## 2022-07-10 MED ORDER — FENTANYL CITRATE PF 50 MCG/ML IJ SOSY: 25.0000 ug | PREFILLED_SYRINGE | INTRAMUSCULAR | Status: DC | PRN

## 2022-07-10 MED ORDER — SUGAMMADEX SODIUM 200 MG/2ML IV SOLN
INTRAVENOUS | Status: DC | PRN
  Administered 2022-07-10: 200 mg via INTRAVENOUS

## 2022-07-10 MED ORDER — SODIUM CHLORIDE 0.9 % IV SOLN
2.0000 g | INTRAVENOUS | Status: DC
  Administered 2022-07-10 – 2022-07-11 (×2): 2 g via INTRAVENOUS
  Filled 2022-07-10 (×2): qty 20

## 2022-07-10 MED ORDER — EPHEDRINE 5 MG/ML INJ
INTRAVENOUS | Status: AC
  Filled 2022-07-10: qty 5

## 2022-07-10 MED ORDER — ONDANSETRON HCL 4 MG/2ML IJ SOLN
INTRAMUSCULAR | Status: AC
  Filled 2022-07-10: qty 2

## 2022-07-10 MED ORDER — ACETAMINOPHEN 10 MG/ML IV SOLN
1000.0000 mg | Freq: Four times a day (QID) | INTRAVENOUS | Status: AC
  Administered 2022-07-10 – 2022-07-11 (×3): 1000 mg via INTRAVENOUS
  Filled 2022-07-10 (×4): qty 100

## 2022-07-10 MED ORDER — MIDAZOLAM HCL 5 MG/5ML IJ SOLN
INTRAMUSCULAR | Status: DC | PRN
  Administered 2022-07-10: 1 mg via INTRAVENOUS

## 2022-07-10 MED ORDER — PANTOPRAZOLE SODIUM 40 MG IV SOLR
40.0000 mg | Freq: Every day | INTRAVENOUS | Status: DC
  Administered 2022-07-10 – 2022-07-11 (×2): 40 mg via INTRAVENOUS
  Filled 2022-07-10 (×2): qty 10

## 2022-07-10 MED ORDER — DIPHENHYDRAMINE HCL 12.5 MG/5ML PO ELIX: 12.5000 mg | ORAL_SOLUTION | Freq: Four times a day (QID) | ORAL | Status: DC | PRN

## 2022-07-10 MED ORDER — HYDRALAZINE HCL 20 MG/ML IJ SOLN
10.0000 mg | INTRAMUSCULAR | Status: DC | PRN
  Administered 2022-07-11: 10 mg via INTRAVENOUS
  Filled 2022-07-10: qty 1

## 2022-07-10 MED ORDER — ROCURONIUM BROMIDE 10 MG/ML (PF) SYRINGE
PREFILLED_SYRINGE | INTRAVENOUS | Status: AC
  Filled 2022-07-10: qty 10

## 2022-07-10 MED ORDER — EPHEDRINE SULFATE-NACL 50-0.9 MG/10ML-% IV SOSY
PREFILLED_SYRINGE | INTRAVENOUS | Status: DC | PRN
  Administered 2022-07-10 (×2): 5 mg via INTRAVENOUS

## 2022-07-10 MED ORDER — SUCCINYLCHOLINE CHLORIDE 200 MG/10ML IV SOSY
PREFILLED_SYRINGE | INTRAVENOUS | Status: AC
  Filled 2022-07-10: qty 10

## 2022-07-10 MED ORDER — LIDOCAINE 2% (20 MG/ML) 5 ML SYRINGE
INTRAMUSCULAR | Status: DC | PRN
  Administered 2022-07-10: 60 mg via INTRAVENOUS

## 2022-07-10 MED ORDER — METHOCARBAMOL 1000 MG/10ML IJ SOLN: 500.0000 mg | Freq: Three times a day (TID) | INTRAVENOUS | Status: DC | PRN

## 2022-07-10 MED ORDER — FENTANYL CITRATE (PF) 100 MCG/2ML IJ SOLN
INTRAMUSCULAR | Status: DC | PRN
  Administered 2022-07-10 (×2): 50 ug via INTRAVENOUS

## 2022-07-10 MED ORDER — MORPHINE SULFATE (PF) 2 MG/ML IV SOLN
2.0000 mg | INTRAVENOUS | Status: DC | PRN
  Administered 2022-07-10: 2 mg via INTRAVENOUS
  Filled 2022-07-10: qty 1

## 2022-07-10 MED ORDER — ENOXAPARIN SODIUM 40 MG/0.4ML IJ SOSY: 40.0000 mg | PREFILLED_SYRINGE | INTRAMUSCULAR | Status: DC

## 2022-07-10 MED ORDER — LIDOCAINE HCL (PF) 2 % IJ SOLN
INTRAMUSCULAR | Status: AC
  Filled 2022-07-10: qty 5

## 2022-07-10 MED ORDER — ONDANSETRON 4 MG PO TBDP: 4.0000 mg | ORAL_TABLET | Freq: Four times a day (QID) | ORAL | Status: DC | PRN

## 2022-07-10 MED ORDER — DIPHENHYDRAMINE HCL 50 MG/ML IJ SOLN: 12.5000 mg | Freq: Four times a day (QID) | INTRAMUSCULAR | Status: DC | PRN

## 2022-07-10 MED ORDER — METOPROLOL TARTRATE 5 MG/5ML IV SOLN: 5.0000 mg | Freq: Four times a day (QID) | INTRAVENOUS | Status: DC | PRN

## 2022-07-10 MED ORDER — PROCHLORPERAZINE MALEATE 10 MG PO TABS: 10.0000 mg | ORAL_TABLET | Freq: Four times a day (QID) | ORAL | Status: DC | PRN

## 2022-07-10 MED ORDER — FENTANYL CITRATE (PF) 100 MCG/2ML IJ SOLN
INTRAMUSCULAR | Status: AC
  Filled 2022-07-10: qty 2

## 2022-07-10 MED ORDER — INSULIN ASPART 100 UNIT/ML IJ SOLN
0.0000 [IU] | INTRAMUSCULAR | Status: DC | PRN
  Administered 2022-07-10: 2 [IU] via SUBCUTANEOUS
  Filled 2022-07-10: qty 1

## 2022-07-10 MED ORDER — METHOCARBAMOL 500 MG PO TABS
500.0000 mg | ORAL_TABLET | Freq: Three times a day (TID) | ORAL | Status: DC | PRN
  Administered 2022-07-11 – 2022-07-13 (×2): 500 mg via ORAL
  Filled 2022-07-10 (×2): qty 1

## 2022-07-10 MED ORDER — MIDAZOLAM HCL 2 MG/2ML IJ SOLN
INTRAMUSCULAR | Status: AC
  Filled 2022-07-10: qty 2

## 2022-07-10 MED ORDER — SODIUM CHLORIDE 0.9 % IV SOLN: INTRAVENOUS | Status: DC

## 2022-07-10 MED ORDER — DEXAMETHASONE SODIUM PHOSPHATE 10 MG/ML IJ SOLN
INTRAMUSCULAR | Status: DC | PRN
  Administered 2022-07-10: 8 mg via INTRAVENOUS

## 2022-07-10 MED ORDER — DEXAMETHASONE SODIUM PHOSPHATE 10 MG/ML IJ SOLN
INTRAMUSCULAR | Status: AC
  Filled 2022-07-10: qty 1

## 2022-07-10 MED ORDER — IOHEXOL 9 MG/ML PO SOLN
500.0000 mL | ORAL | Status: AC
  Administered 2022-07-10: 500 mL via ORAL

## 2022-07-10 MED ORDER — BUPIVACAINE HCL (PF) 0.25 % IJ SOLN
INTRAMUSCULAR | Status: DC | PRN
  Administered 2022-07-10: 20 mL

## 2022-07-10 MED ORDER — ROCURONIUM BROMIDE 10 MG/ML (PF) SYRINGE
PREFILLED_SYRINGE | INTRAVENOUS | Status: DC | PRN
  Administered 2022-07-10: 50 mg via INTRAVENOUS

## 2022-07-10 MED ORDER — ACETAMINOPHEN 10 MG/ML IV SOLN
INTRAVENOUS | Status: DC | PRN
  Administered 2022-07-10: 1000 mg via INTRAVENOUS

## 2022-07-10 MED ORDER — ONDANSETRON HCL 4 MG/2ML IJ SOLN
INTRAMUSCULAR | Status: DC | PRN
  Administered 2022-07-10: 4 mg via INTRAVENOUS

## 2022-07-10 MED ORDER — BUPIVACAINE HCL 0.25 % IJ SOLN
INTRAMUSCULAR | Status: AC
  Filled 2022-07-10: qty 1

## 2022-07-10 MED ORDER — PHENYLEPHRINE HCL-NACL 20-0.9 MG/250ML-% IV SOLN
INTRAVENOUS | Status: DC | PRN
  Administered 2022-07-10: 20 ug/min via INTRAVENOUS

## 2022-07-10 MED ORDER — PROMETHAZINE HCL 25 MG/ML IJ SOLN: 6.2500 mg | INTRAMUSCULAR | Status: DC | PRN

## 2022-07-10 SURGICAL SUPPLY — 76 items
ADH SKN CLS APL DERMABOND .7 (GAUZE/BANDAGES/DRESSINGS)
APL PRP STRL LF DISP 70% ISPRP (MISCELLANEOUS) ×1
APPLIER CLIP 5 13 M/L LIGAMAX5 (MISCELLANEOUS)
APPLIER CLIP ROT 10 11.4 M/L (STAPLE)
APR CLP MED LRG 11.4X10 (STAPLE)
APR CLP MED LRG 5 ANG JAW (MISCELLANEOUS)
BAG COUNTER SPONGE SURGICOUNT (BAG) IMPLANT
BAG SPNG CNTER NS LX DISP (BAG)
BLADE EXTENDED COATED 6.5IN (ELECTRODE) IMPLANT
BLADE SURG SZ10 CARB STEEL (BLADE) IMPLANT
CELLS DAT CNTRL 66122 CELL SVR (MISCELLANEOUS) ×1 IMPLANT
CHLORAPREP W/TINT 26 (MISCELLANEOUS) ×1 IMPLANT
CLIP APPLIE 5 13 M/L LIGAMAX5 (MISCELLANEOUS) IMPLANT
CLIP APPLIE ROT 10 11.4 M/L (STAPLE) IMPLANT
COVER MAYO STAND STRL (DRAPES) ×1 IMPLANT
COVER SURGICAL LIGHT HANDLE (MISCELLANEOUS) ×1 IMPLANT
DERMABOND ADVANCED .7 DNX12 (GAUZE/BANDAGES/DRESSINGS) IMPLANT
DRAIN CHANNEL 19F RND (DRAIN) IMPLANT
DRAPE LAPAROSCOPIC ABDOMINAL (DRAPES) ×1 IMPLANT
DRAPE SHEET LG 3/4 BI-LAMINATE (DRAPES) IMPLANT
DRAPE WARM FLUID 44X44 (DRAPES) IMPLANT
DRSG OPSITE POSTOP 4X10 (GAUZE/BANDAGES/DRESSINGS) IMPLANT
DRSG OPSITE POSTOP 4X6 (GAUZE/BANDAGES/DRESSINGS) IMPLANT
DRSG OPSITE POSTOP 4X8 (GAUZE/BANDAGES/DRESSINGS) IMPLANT
ELECT REM PT RETURN 15FT ADLT (MISCELLANEOUS) ×1 IMPLANT
EVACUATOR SILICONE 100CC (DRAIN) IMPLANT
GAUZE SPONGE 4X4 12PLY STRL (GAUZE/BANDAGES/DRESSINGS) ×1 IMPLANT
GLOVE BIO SURGEON STRL SZ 6 (GLOVE) ×2 IMPLANT
GLOVE INDICATOR 6.5 STRL GRN (GLOVE) ×2 IMPLANT
GLOVE INDICATOR 8.0 STRL GRN (GLOVE) ×1 IMPLANT
GLOVE SS BIOGEL STRL SZ 6 (GLOVE) ×1 IMPLANT
GLOVE SURG LX STRL 7.5 STRW (GLOVE) ×1 IMPLANT
GOWN STRL REUS W/ TWL LRG LVL3 (GOWN DISPOSABLE) ×1 IMPLANT
GOWN STRL REUS W/ TWL XL LVL3 (GOWN DISPOSABLE) IMPLANT
GOWN STRL REUS W/TWL LRG LVL3 (GOWN DISPOSABLE) ×1
GOWN STRL REUS W/TWL XL LVL3 (GOWN DISPOSABLE)
HANDLE SUCTION POOLE (INSTRUMENTS) IMPLANT
IRRIG SUCT STRYKERFLOW 2 WTIP (MISCELLANEOUS)
IRRIGATION SUCT STRKRFLW 2 WTP (MISCELLANEOUS) IMPLANT
KIT BASIN OR (CUSTOM PROCEDURE TRAY) ×1 IMPLANT
KIT TURNOVER KIT A (KITS) IMPLANT
LEGGING LITHOTOMY PAIR STRL (DRAPES) IMPLANT
PACK GENERAL/GYN (CUSTOM PROCEDURE TRAY) ×1 IMPLANT
RETRACTOR WND ALEXIS 18 MED (MISCELLANEOUS) IMPLANT
RTRCTR WOUND ALEXIS 18CM MED (MISCELLANEOUS) ×1
SCISSORS LAP 5X35 DISP (ENDOMECHANICALS) ×1 IMPLANT
SET TUBE SMOKE EVAC HIGH FLOW (TUBING) IMPLANT
SHEARS HARMONIC ACE PLUS 36CM (ENDOMECHANICALS) IMPLANT
SLEEVE Z-THREAD 5X100MM (TROCAR) ×1 IMPLANT
SPIKE FLUID TRANSFER (MISCELLANEOUS) ×1 IMPLANT
STAPLER VISISTAT 35W (STAPLE) IMPLANT
STRIP CLOSURE SKIN 1/2X4 (GAUZE/BANDAGES/DRESSINGS) IMPLANT
SUCTION POOLE HANDLE (INSTRUMENTS) ×1
SUT ETHILON 3 0 PS 1 (SUTURE) IMPLANT
SUT MNCRL AB 4-0 PS2 18 (SUTURE) IMPLANT
SUT NOVA T20/GS 25 (SUTURE) IMPLANT
SUT PDS AB 1 TP1 96 (SUTURE) IMPLANT
SUT PROLENE 2 0 KS (SUTURE) IMPLANT
SUT PROLENE 2 0 SH DA (SUTURE) IMPLANT
SUT SILK 2 0 (SUTURE) ×1
SUT SILK 2 0 SH CR/8 (SUTURE) ×1 IMPLANT
SUT SILK 2-0 18XBRD TIE 12 (SUTURE) ×1 IMPLANT
SUT SILK 3 0 (SUTURE) ×1
SUT SILK 3 0 SH CR/8 (SUTURE) ×1 IMPLANT
SUT SILK 3-0 18XBRD TIE 12 (SUTURE) ×1 IMPLANT
SYR BULB IRRIG 60ML STRL (SYRINGE) IMPLANT
SYS LAPSCP GELPORT 120MM (MISCELLANEOUS)
SYSTEM LAPSCP GELPORT 120MM (MISCELLANEOUS) IMPLANT
TOWEL OR 17X26 10 PK STRL BLUE (TOWEL DISPOSABLE) ×1 IMPLANT
TOWEL OR NON WOVEN STRL DISP B (DISPOSABLE) ×1 IMPLANT
TRAY FOLEY MTR SLVR 14FR STAT (SET/KITS/TRAYS/PACK) ×1 IMPLANT
TRAY FOLEY MTR SLVR 16FR STAT (SET/KITS/TRAYS/PACK) ×1 IMPLANT
TRAY LAPAROSCOPIC (CUSTOM PROCEDURE TRAY) ×1 IMPLANT
TROCAR 11X100 Z THREAD (TROCAR) IMPLANT
TROCAR Z-THREAD OPTICAL 5X100M (TROCAR) ×1 IMPLANT
YANKAUER SUCT BULB TIP NO VENT (SUCTIONS) IMPLANT

## 2022-07-10 NOTE — Progress Notes (Signed)
Received report on pt from [place], Charity fundraiser at [facility]. Pt d/t arrive 07/10/22 for admission.

## 2022-07-10 NOTE — Op Note (Signed)
Operative Note  Allen [name] W.G. (Allen [name]) [name]  [id number]  [id number]  07/10/2022   Surgeon: Allen Blakes MD FACS   Assistant: Allen Ching MD FACS   Procedure performed: Diagnostic laparoscopy, exploratory laparotomy with reduction of small bowel volvulus and repair of incarcerated internal hernia at the jejunojejunostomy mesenteric defect containing essentially the entire common channel and part of the Roux limb/ JJ anastomosis   Preop diagnosis: abdominal pain, leukocytosis and lactic acidosis Post-op diagnosis/intraop findings: Approximately 2.5 cm defect in the base of the jejunojejunostomy mesentery through which nearly the entire common channel and a portion of the Roux limb along with the jejunal anastomosis had herniated.  Bowel all appeared viable.   Specimens: no Retained items: no  EBL: minimal cc Complications: none   Description of procedure: After obtaining informed consent the patient was taken to the operating room and placed supine on operating room table where general endotracheal anesthesia was initiated, preoperative antibiotics were administered, SCDs applied, and a formal timeout was performed.  The abdomen was prepped and draped in usual sterile fashion.  Peritoneal access was gained with a left subcostal Veress needle and insufflation to 15 mmHg ensued without incident.  Optical entry was then performed in the right upper quadrant and the abdominal cavity inspected.  There was no injury from our entry or from the Veress needle which was removed.  There was diffuse dilation of small bowel as well as lymphatic appearing ascites, small volume.  Under direct visualization 4 more 5 mm trocars were placed in the right and left hemiabdomen as well as in the umbilical field.  The gastrojejunostomy was easily identified and appeared intact without inflammatory changes.  The Roux limb was very dilated and this was followed distally.  This dove into an internal hernia defect in the left  upper quadrant.  I then turned to the right lower quadrant and identified the ileocecal valve.  The terminal ileum was traced proximally just about 15 cm before it also dove into an internal hernia defect.  I attempted to reduce this with gentle traction and we were able to would reduce about 50 cm of the common channel however at this point there was too much tension to safely reduce this laparoscopically.  A small midline incision was made and an Alexis wound protector was placed.  The ileocecal valve was again identified and followed proximally, with continued gentle traction and manual pressure we were ultimately able to reduce all the small bowel out of what turned out to be a small defect at the base of the previously closed JJ mesentery.  The bowel did all appear viable.  The JJ mesenteric defect was closed with 3 interrupted 3-0 silks.  The anastomosis appeared widely patent.  There was a small bandlike area going across the very distal aspect of the BP limb where it had likely been kinked against the edge of this internal hernia and appeared somewhat scarred although intact; I did imbricate this with several simple interrupted seromuscular 3-0 silks. I also reinforced the Petersons space with a pursestring 3-0 silk.  The abdomen was irrigated with warm sterile saline and the bowel again inspected including the Roux limb, BP limb, and common channel.  This all did appear viable.  The wound protector was removed and the fascia was closed with running looped #1 PDS starting at either end and tying centrally.  All the skin incisions were closed with subcuticular 4-0 Monocryl followed by benzoin, Steri-Strips and sterile dressings.  The patient was then awakened,  extubated and taken to PACU in stable condition.    All counts were correct at the completion of the case.

## 2022-07-10 NOTE — Transfer of Care (Signed)
Immediate Anesthesia Transfer of Care Note  Patient: Allen [name]  Procedure(s) Performed: LAPAROSCOPY DIAGNOSTIC; REDUCTION AND REPAIR OF INTERNAL HERNIA EXPLORATORY LAPAROTOMY  Patient Location: PACU  Anesthesia Type:General  Level of Consciousness: awake, alert , oriented, and patient cooperative  Airway & Oxygen Therapy: Patient Spontanous Breathing and Patient connected to face mask oxygen  Post-op Assessment: Report given to RN, Post -op Vital signs reviewed and stable, and Patient moving all extremities  Post vital signs: Reviewed and stable  Last Vitals:  Vitals Value Taken Time  BP 160/86 07/10/22 1405  Temp    Pulse 87 07/10/22 1410  Resp 12 07/10/22 1410  SpO2 95 % 07/10/22 1410  Vitals shown include unvalidated device data.  Last Pain:  Vitals:   07/10/22 1153  TempSrc:   PainSc: 10-Worst pain ever      Patients Stated Pain Goal: 2 (07/10/22 0956)  Complications: No notable events documented.

## 2022-07-10 NOTE — Progress Notes (Signed)
Patient's lactic acid was 3.3  Charlott Holler MD aware.

## 2022-07-10 NOTE — Anesthesia Procedure Notes (Signed)
Procedure Name: Intubation Date/Time: 07/10/2022 12:38 PM  Performed by: Elisabeth Cara, CRNAPre-anesthesia Checklist: Patient identified, Suction available, Emergency Drugs available, Patient being monitored and Timeout performed Patient Re-evaluated:Patient Re-evaluated prior to induction Oxygen Delivery Method: Circle system utilized Preoxygenation: Pre-oxygenation with 100% oxygen Induction Type: IV induction, Rapid sequence and Cricoid Pressure applied Laryngoscope Size: Glidescope and 3 Grade View: Grade I Tube type: Parker flex tip Tube size: 7.5 mm Number of attempts: 1 Airway Equipment and Method: Rigid stylet and Video-laryngoscopy Placement Confirmation: ETT inserted through vocal cords under direct vision, positive ETCO2 and breath sounds checked- equal and bilateral Secured at: 22 cm Tube secured with: Tape Dental Injury: Teeth and Oropharynx as per pre-operative assessment  Comments: Elective Glidescope intubation due to pt's decreased neck range of motion from multiple cervical neck surgeries/fusions. Smooth RSI. Grade 1 view. ATOI. BBS=.

## 2022-07-10 NOTE — H&P (Signed)
Allen [name] [facility] Dec 08, 1950  [id number].    Requesting MD: [facility] Chief Complaint/Reason for Consult: Hx Bariatric surgery, reported internal hernia   HPI: Allen [name] is a 72 y.o. male with hx of HTN, CKD4, DM2, asthma and prior gastric bypass that was transferred from [facility] for reported possible internal hernia.  Patient reports that yesterday around 11:30 in the afternoon he began having gradual onset of lower abdominal pain.  Pain is constant, has gradually worsened and is currently a 9/10.  Associated nausea and dry heaves.  Denies fever, chest pain, shortness of breath, vomiting, urinary symptoms or diarrhea.  Last episode of flatus and BM were 2 days ago.  No melena or hematochezia at that time.  Reports he had a history of similar lower abdominal pain last year when he was at [facility] but was much less severe.  He was seen at an ED in [facility] and reports that his workup was "negative". Denies NSAID use.   Patient has a history of Roux-en-Y gastric bypass by Dr. Doylene Canard in 2018.  In chart review it looks like she has seen him several times in the office for issues with recurrent weight loss, nausea, fatigue and early satiety. He reports he has lost ~100lbs since his surgery in 2018 and currently weighs 171lbs.   Prior Abdominal Surgeries: Cholecystectomy. Roux-en-Y gastric bypass by Dr. Doylene Canard in 2018 Blood Thinners: None Allergies: Levofloxacin, Baclofen Tobacco Use: None Alcohol Use: None Substance use: None  Employment: Part time for [employer]  Denies a history of MI, CVA, asthma, or COPD. At baseline states he mobilizes without an assistive device and can climb a flight of stairs without stopping due to DOE or chest pain.  ROS: ROS As above, see hpi  Family History  Problem Relation Age of Onset   Hypertension Mother    Heart attack Mother    Hypertension Father    Cancer Father     Past Medical History:  Diagnosis Date   Anemia    on  iron since gastric bypass   Arthritis    mild   Asthma    Chronic kidney disease    stage 3, now resolved since weight loss   Claustrophobia    Diabetes mellitus, type II (HCC)    type 2 metformin   GERD (gastroesophageal reflux disease)    takes nexium   Hyperlipidemia    Hypertension    Pneumonia 15 yrs ago   SCC (squamous cell carcinoma) 12/29/2007   Left Mid Preauricular(Mod.Diff) (Dr. Adriana Simas)   SCC (squamous cell carcinoma) 12/29/2007   Right Upper Dorsal Nose(Well Diff) (Dr. Adriana Simas)   SCC (squamous cell carcinoma) 12/29/2007   Anti-Tragus Right Ear(Mod.Diff) (Dr. Adriana Simas)   SCC (squamous cell carcinoma) 06/06/2010   Left Nose(in Situ) (curet and 5FU)   SCC (squamous cell carcinoma) 06/06/2010   Right Check(Well Diff) (curet and 5FU)   SCC (squamous cell carcinoma) 07/20/2013   Right Sideburn(KA) (curet and 5FU)   SCC (squamous cell carcinoma) 06/27/2015   Right Neck Sup(in Situ) (curet and 5FU)   SCC (squamous cell carcinoma) 06/27/2015   Right Neck Inf(in Situ) (curet and 5FU)   SCC (squamous cell carcinoma) 01/02/2017   Left Wrist(in Situ) (curet and 5FU)   SCC (squamous cell carcinoma) 01/02/2017   Right Outer Cheek(in Situ) (curet and 5FU)   SCC (squamous cell carcinoma) 06/23/2018   Left Sideburn(in Situ) (curet and 5FU)   SCC (squamous cell carcinoma) 06/23/2018  Left Upper Lip(Well Diff) (MOH's)   SCC (squamous cell carcinoma) 06/23/2018   Right Ear Rim(in Situ) (curet and 5FU)   Sleep apnea    cpap, no cpap since may 2018 weight loss 80 labs    Superficial nodular basal cell carcinoma (BCC) 06/27/2015   Left Rim of Ear (curet and 5FU)    Past Surgical History:  Procedure Laterality Date   ANTERIOR CERVICAL DECOMP/DISCECTOMY FUSION Right 10/10/2018   Procedure: Right sided revision with hardware removal at Cervical three-four, Cervical four-five Anterior cervical decompression/discectomy/fusion with corpectomy at Cervical five;  Surgeon: Maeola Harman, MD;   Location: [facility] OR;  Service: Neurosurgery;  Laterality: Right;   CARDIAC CATHETERIZATION     patient reports he thinks he had one over 25 years ago   carpal tunnel Bilateral    CERVICAL VERTEBRAE EXCISION     and cleaning   CHOLECYSTECTOMY     cyst removed left hand  01-28-17   ESOPHAGOGASTRODUODENOSCOPY (EGD) WITH PROPOFOL N/A 01/31/2017   Procedure: ESOPHAGOGASTRODUODENOSCOPY (EGD) WITH PROPOFOL;  Surgeon: Ovidio Kin, MD;  Location: [facility];  Service: General;  Laterality: N/A;   EYE SURGERY     bilateral cataract removal   GASTRIC ROUX-EN-Y N/A 06/04/2016   Procedure: LAPAROSCOPIC ROUX-EN-Y GASTRIC BYPASS WITH UPPER ENDOSCOPY;  Surgeon: Berna Bue, MD;  Location: [facility] ORS;  Service: General;  Laterality: N/A;   NASAL SINUS SURGERY     POSTERIOR CERVICAL FUSION/FORAMINOTOMY N/A 06/08/2019   Procedure: Cervical Three to Cervical Seven Posterior cervical decompression and fusion;  Surgeon: Maeola Harman, MD;  Location: [facility] OR;  Service: Neurosurgery;  Laterality: N/A;  Cervical Three to Cervical Seven Posterior cervical decompression and fusion   RADIOLOGY WITH ANESTHESIA N/A 04/28/2019   Procedure: MRI RADIOLOGY WITH ANESTHESIA;  Surgeon: Radiologist, Medication, MD;  Location: [facility] OR;  Service: Radiology;  Laterality: N/A;   TRIGGER FINGER RELEASE      Social History:  reports that he has never smoked. He has never been exposed to tobacco smoke. He has never used smokeless tobacco. He reports that he does not drink alcohol and does not use drugs.  Allergies:  Allergies  Allergen Reactions   Gramineae Pollens Cough   Levofloxacin Nausea And Vomiting   Baclofen Anxiety and Other (See Comments)    Alters mental state    Facility-Administered Medications Prior to Admission  Medication Dose Route Frequency Provider Last Rate Last Admin   dupilumab (DUPIXENT) prefilled syringe 300 mg  300 mg Subcutaneous Q14 Days Alfonse Spruce, MD   300 mg at 03/02/22 4098   Medications  Prior to Admission  Medication Sig Dispense Refill   acetaminophen (TYLENOL) 500 MG tablet Take 1,000 mg by mouth every 8 (eight) hours as needed for mild pain.     albuterol (PROAIR HFA) 108 (90 Base) MCG/ACT inhaler 2 puffs every 4 hours as needed only  if your can't catch your breath 1 each 1   amLODipine (NORVASC) 5 MG tablet Take 10 mg by mouth daily.     azithromycin (ZITHROMAX) 500 MG tablet Take 1 tablet (500 mg total) by mouth every Monday, Wednesday, and Friday. 36 tablet 1   Calcium Carb-Cholecalciferol (CALCIUM 500+D3 PO) Take 1 tablet by mouth 3 (three) times daily.      calcium carbonate (TUMS - DOSED IN MG ELEMENTAL CALCIUM) 500 MG chewable tablet Chew 1-2 tablets by mouth 3 (three) times daily as needed for indigestion or heartburn.     Cyanocobalamin (VITAMIN B12) 1000 MCG TBCR Take 1  tablet by mouth daily.     Dulaglutide (TRULICITY) 0.75 MG/0.5ML SOPN Inject 0.75 mg into the skin once a week. 6 mL 1   escitalopram (LEXAPRO) 20 MG tablet Take 20 mg by mouth daily.     famotidine (PEPCID) 20 MG tablet One after supper 30 tablet 11   Fluticasone-Umeclidin-Vilant (TRELEGY ELLIPTA) 200-62.5-25 MCG/ACT AEPB Inhale 1 Inhalation into the lungs in the morning. 90 each 2   hydrochlorothiazide (HYDRODIURIL) 12.5 MG tablet Take 12.5 mg by mouth daily.     Magnesium 400 MG CAPS Take 400 mg by mouth daily.      Multiple Vitamin (MULTIVITAMIN WITH MINERALS) TABS tablet Take 1 tablet by mouth daily.     olmesartan (BENICAR) 40 MG tablet Take 40 mg by mouth daily.     pantoprazole (PROTONIX) 40 MG tablet Take 1 tablet (40 mg total) by mouth daily. Take 30-60 min before first meal of the day 30 tablet 2     Physical Exam: Blood pressure (!) 176/78, pulse 73, temperature 98.7 F (37.1 C), temperature source Oral, resp. rate 18, SpO2 100 %. General: pleasant, WD/WN male who is laying in bed and appears at least moderatly uncomfortable.  HEENT: head is normocephalic, atraumatic.  Sclera are  noninjected.  PERRL.  Ears and nose without any masses or lesions.  Mouth is pink and moist. Dentition fair Heart: regular, rate, and rhythm Lungs: CTAB, no wheezes, rhonchi, or rales noted.  Respiratory effort nonlabored Abd:  Soft with at least mild distension. There is some fullness of the left side of his abdomen. Lower abdominal ttp diffusely worse in the RLQ with at least voluntary guarding. No rigidity. +BS. No masses or organomegaly MS: no BUE or BLE edema Skin: warm and dry  Psych: A&Ox4 with an appropriate affect Neuro: cranial nerves grossly intact, normal speech, thought process intact, moves all extremities, gait not assessed  No results found for this or any previous visit (from the past 48 hour(s)). No results found.  Anti-infectives (From admission, onward)    None                  Assessment/Plan Lower abdominal pain, nausea and dry heaves in patient with hx of prior Roux-en-Y gastric bypass by Dr. Doylene Canard in 2018 This is a 71 y.o. male who was transferred from [facility] with lower abdominal pain that began yesterday around 1130am w/ associated nausea and dry heaves.  Currently he HDA without fever, tachycardia or hypotension.  Exam as above with at least mild distention, left-sided fullness and diffuse lower abdominal tenderness with at least voluntary guarding.  Reviewing his records from [place], his WBC was wnl at 6.87, lactic 1.0, Cr 3.51 (hx CKD4 per patient report) and CT w/ diffuse stranding in the central mesentery, which may represent sclerosing mesenteritis with other etiologies not excluded. We discussed case with Dr. Fredricka Bonine who recommended repeat CT A/P with PO contrast. I talked with Radiology who said this could be done ~11am. Basic labs ordered. Will keep NPO incase patient needs OR today.   FEN - NPO, IVF VTE - SCDs, Lovenox ID - Rocephin x 1  Foley - None Dispo - Admit to inpatient.   I reviewed nursing notes, last 24 h vitals and pain scores,  last 48 h intake and output, last 24 h labs and trends, and last 24 h imaging results. Reviewed outside records as above.   Jacinto Halim, [facility] Surgery 07/10/2022, 8:21 AM Please see Amion for pager number  during day hours 7:00am-4:30pm

## 2022-07-10 NOTE — Progress Notes (Addendum)
S: Reports pain is constant, (not colicky) and has been infraumbilical throughout the last 24 hours. Last bowel movement Sunday. Up until yesterday around 11:30 he had been doing well, at his baseline. Constipation has been an ongoing issue but as of late has been well controlled.   O: Vitals, labs, intake/output, and orders reviewed at this time. T98.7, HR 73, sat 100% RA, hypertensive. Labs repeated here notable for new WBC 14 ( was normal at [place]) , lactate now increased to 3.3.    Gen: A&Ox3, no distress but appears uncomfortable H&N: EOMI, atraumatic, neck supple Chest: unlabored respirations, RRR Abd: soft, mildly distended, tender in the lower fields with guarding, some referred tenderness with palpation of upper abdomen Ext: warm, no edema Neuro: grossly normal  Lines/tubes/drains: PIV  A/P: 72yo man who is 6 years s/p RYGB. Has acute onset and persistent lower abdominal pain, which is atypical for internal hernia; his CT at [facility] described central mesenteric stranding but no mention of bowel obstruction or volvulus. I am not sure if this is bypass related or some other etiology but given ongoing severe pain, rising WBC and rising lactate despite abx and IV fluids, I think the best approach would be to proceed with diagnostic laparoscopy, possible exploratory laparotomy to evaluate for bowel ischemia. I discussed this with him and his wife and they are agreeable to proceed.     Phylliss Blakes, MD [facility] Surgery, [place]

## 2022-07-10 NOTE — Anesthesia Preprocedure Evaluation (Signed)
Anesthesia Evaluation  Patient identified by MRN, date of birth, ID band Patient awake    Reviewed: Allergy & Precautions, NPO status , Patient's Chart, lab work & pertinent test results  Airway Mallampati: II  TM Distance: >3 FB Neck ROM: Full    Dental  (+) Dental Advisory Given   Pulmonary asthma , sleep apnea    breath sounds clear to auscultation       Cardiovascular hypertension, Pt. on medications  Rhythm:Regular Rate:Normal     Neuro/Psych negative neurological ROS     GI/Hepatic Neg liver ROS,GERD  ,,S/p gastric bypass. Now with abdominal pain and rising WBC and Lactate concerning for bowel ischemia.   Endo/Other  diabetes, Type 2    Renal/GU CRF and ARFRenal disease     Musculoskeletal  (+) Arthritis ,    Abdominal   Peds  Hematology  (+) Blood dyscrasia, anemia   Anesthesia Other Findings   Reproductive/Obstetrics                              Lab Results  Component Value Date   WBC 14.0 (H) 07/10/2022   HGB 12.5 (L) 07/10/2022   HCT 38.8 (L) 07/10/2022   MCV 88.0 07/10/2022   PLT 161 07/10/2022   Lab Results  Component Value Date   CREATININE 3.04 (H) 07/10/2022   BUN 41 (H) 07/10/2022   NA 138 07/10/2022   K 4.4 07/10/2022   CL 107 07/10/2022   CO2 18 (L) 07/10/2022    Anesthesia Physical Anesthesia Plan  ASA: 4 and emergent  Anesthesia Plan: General   Post-op Pain Management: Ofirmev IV (intra-op)* and Minimal or no pain anticipated   Induction: Intravenous and Rapid sequence  PONV Risk Score and Plan: 2 and Dexamethasone, Ondansetron and Treatment may vary due to age or medical condition  Airway Management Planned: Oral ETT  Additional Equipment:   Intra-op Plan:   Post-operative Plan: Possible Post-op intubation/ventilation  Informed Consent: I have reviewed the patients History and Physical, chart, labs and discussed the procedure including the  risks, benefits and alternatives for the proposed anesthesia with the patient or authorized representative who has indicated his/her understanding and acceptance.     Dental advisory given  Plan Discussed with: CRNA  Anesthesia Plan Comments:          Anesthesia Quick Evaluation

## 2022-07-11 ENCOUNTER — Encounter (HOSPITAL_COMMUNITY): Payer: Self-pay | Admitting: Surgery

## 2022-07-11 LAB — CBC
HCT: 32.2 % — ABNORMAL LOW (ref 39.0–52.0)
Hemoglobin: 10.2 g/dL — ABNORMAL LOW (ref 13.0–17.0)
MCH: 28.5 pg (ref 26.0–34.0)
MCHC: 31.7 g/dL (ref 30.0–36.0)
MCV: 89.9 fL (ref 80.0–100.0)
Platelets: 131 10*3/uL — ABNORMAL LOW (ref 150–400)
RBC: 3.58 MIL/uL — ABNORMAL LOW (ref 4.22–5.81)
RDW: 14.6 % (ref 11.5–15.5)
WBC: 10.3 10*3/uL (ref 4.0–10.5)
nRBC: 0 % (ref 0.0–0.2)

## 2022-07-11 LAB — BASIC METABOLIC PANEL
Anion gap: 8 (ref 5–15)
BUN: 46 mg/dL — ABNORMAL HIGH (ref 8–23)
CO2: 20 mmol/L — ABNORMAL LOW (ref 22–32)
Calcium: 7.6 mg/dL — ABNORMAL LOW (ref 8.9–10.3)
Chloride: 112 mmol/L — ABNORMAL HIGH (ref 98–111)
Creatinine, Ser: 2.83 mg/dL — ABNORMAL HIGH (ref 0.61–1.24)
GFR, Estimated: 23 mL/min — ABNORMAL LOW (ref >60)
Glucose, Bld: 177 mg/dL — ABNORMAL HIGH (ref 70–99)
Potassium: 4 mmol/L (ref 3.5–5.1)
Sodium: 140 mmol/L (ref 135–145)

## 2022-07-11 LAB — HEMOGLOBIN A1C
Hgb A1c MFr Bld: 6 % — ABNORMAL HIGH (ref 4.8–5.6)
Mean Plasma Glucose: 125.5 mg/dL

## 2022-07-11 LAB — GLUCOSE, CAPILLARY
Glucose-Capillary: 145 mg/dL — ABNORMAL HIGH (ref 70–99)
Glucose-Capillary: 108 mg/dL — ABNORMAL HIGH (ref 70–99)
Glucose-Capillary: 161 mg/dL — ABNORMAL HIGH (ref 70–99)

## 2022-07-11 MED ORDER — IPRATROPIUM-ALBUTEROL 0.5-2.5 (3) MG/3ML IN SOLN: 3.0000 mL | Freq: Four times a day (QID) | RESPIRATORY_TRACT | Status: DC | PRN

## 2022-07-11 MED ORDER — ACETAMINOPHEN 10 MG/ML IV SOLN: 1000.0000 mg | Freq: Four times a day (QID) | INTRAVENOUS | Status: DC

## 2022-07-11 MED ORDER — ACETAMINOPHEN 500 MG PO TABS
1000.0000 mg | ORAL_TABLET | Freq: Four times a day (QID) | ORAL | Status: DC
  Administered 2022-07-11 – 2022-07-13 (×8): 1000 mg via ORAL
  Filled 2022-07-11 (×8): qty 2

## 2022-07-11 MED ORDER — DOCUSATE SODIUM 100 MG PO CAPS
100.0000 mg | ORAL_CAPSULE | Freq: Two times a day (BID) | ORAL | Status: DC
  Administered 2022-07-11 – 2022-07-13 (×5): 100 mg via ORAL
  Filled 2022-07-11 (×5): qty 1

## 2022-07-11 MED ORDER — BISACODYL 10 MG RE SUPP
10.0000 mg | Freq: Every day | RECTAL | Status: DC | PRN
  Administered 2022-07-12: 10 mg via RECTAL
  Filled 2022-07-11: qty 1

## 2022-07-11 MED ORDER — HYDROCHLOROTHIAZIDE 12.5 MG PO TABS
12.5000 mg | ORAL_TABLET | Freq: Every day | ORAL | Status: DC
  Administered 2022-07-11 – 2022-07-13 (×3): 12.5 mg via ORAL
  Filled 2022-07-11 (×3): qty 1

## 2022-07-11 MED ORDER — HYDROMORPHONE HCL 1 MG/ML IJ SOLN: 0.5000 mg | INTRAMUSCULAR | Status: DC | PRN

## 2022-07-11 MED ORDER — IRBESARTAN 150 MG PO TABS
150.0000 mg | ORAL_TABLET | Freq: Every day | ORAL | Status: DC
  Administered 2022-07-11: 150 mg via ORAL
  Filled 2022-07-11: qty 1

## 2022-07-11 MED ORDER — OXYCODONE HCL 5 MG PO TABS
5.0000 mg | ORAL_TABLET | Freq: Four times a day (QID) | ORAL | Status: DC | PRN
  Administered 2022-07-13: 5 mg via ORAL
  Filled 2022-07-11: qty 1

## 2022-07-11 MED ORDER — ESCITALOPRAM OXALATE 20 MG PO TABS
20.0000 mg | ORAL_TABLET | Freq: Every day | ORAL | Status: DC
  Administered 2022-07-11 – 2022-07-13 (×3): 20 mg via ORAL
  Filled 2022-07-11 (×3): qty 1

## 2022-07-11 MED ORDER — ALBUTEROL SULFATE (2.5 MG/3ML) 0.083% IN NEBU: 2.5000 mg | INHALATION_SOLUTION | Freq: Four times a day (QID) | RESPIRATORY_TRACT | Status: DC | PRN

## 2022-07-11 MED ORDER — INSULIN ASPART 100 UNIT/ML IJ SOLN
0.0000 [IU] | Freq: Three times a day (TID) | INTRAMUSCULAR | Status: DC
  Administered 2022-07-11: 1 [IU] via SUBCUTANEOUS
  Administered 2022-07-12 – 2022-07-13 (×2): 2 [IU] via SUBCUTANEOUS

## 2022-07-11 MED ORDER — AMLODIPINE BESYLATE 10 MG PO TABS
10.0000 mg | ORAL_TABLET | Freq: Every day | ORAL | Status: DC
  Administered 2022-07-11 – 2022-07-13 (×3): 10 mg via ORAL
  Filled 2022-07-11 (×3): qty 1

## 2022-07-11 NOTE — Progress Notes (Signed)
1 Day Post-Op  Subjective: CC: Had a good night. Pain significantly better. Some flatus. No nausea. Hasn't really taken any pain meds.   Afebrile. No tachycardia or hypotension. Hypertensive (170/75) this am. WBC 10.3. Hgb 10.2 from 12.5. Cr downtrending.    Objective: Vital signs in last 24 hours: Temp:  [97.9 F (36.6 C)-98.8 F (37.1 C)] 97.9 F (36.6 C) (06/19 0548) Pulse Rate:  [68-87] 68 (06/19 0548) Resp:  [12-22] 18 (06/19 0548) BP: (137-188)/(72-86) 170/75 (06/19 0548) SpO2:  [95 %-100 %] 99 % (06/19 0548) Weight:  [77.1 kg] 77.1 kg (06/18 1153) Last BM Date : 07/08/22  Intake/Output from previous day: 06/18 0701 - 06/19 0700 In: 2385.3 [P.O.:240; I.V.:1745.3; IV Piggyback:400] Out: 3015 [Urine:3000; Blood:15] Intake/Output this shift: No intake/output data recorded.  PE:  Gen:  Alert, NAD, pleasant HEENT: EOM's intact, pupils equal and round Card:  RRR Pulm:  CTAB, no W/R/R, effort normal Abd: Soft, ND, NT +BS, incisions w OR dressings intact, no cellulitis or hematoma Ext:  No LE edema or calf tenderness Psych: A&Ox3  Skin: no rashes noted, warm and dry  Lab Results:  Recent Labs    07/10/22 0934 07/11/22 0512  WBC 14.0* 10.3  HGB 12.5* 10.2*  HCT 38.8* 32.2*  PLT 161 131*   BMET Recent Labs    07/10/22 0934 07/11/22 0512  NA 138 140  K 4.4 4.0  CL 107 112*  CO2 18* 20*  GLUCOSE 208* 177*  BUN 41* 46*  CREATININE 3.04* 2.83*  CALCIUM 8.9 7.6*   PT/INR No results for input(s): "LABPROT", "INR" in the last 72 hours. CMP     Component Value Date/Time   NA 140 07/11/2022 0512   NA 141 05/30/2022 0857   K 4.0 07/11/2022 0512   CL 112 (H) 07/11/2022 0512   CO2 20 (L) 07/11/2022 0512   GLUCOSE 177 (H) 07/11/2022 0512   BUN 46 (H) 07/11/2022 0512   BUN 41 (H) 05/30/2022 0857   CREATININE 2.83 (H) 07/11/2022 0512   CALCIUM 7.6 (L) 07/11/2022 0512   PROT 7.6 07/10/2022 0934   PROT 6.4 05/30/2022 0857   ALBUMIN 4.1 07/10/2022 0934    ALBUMIN 4.1 05/30/2022 0857   AST 25 07/10/2022 0934   ALT 25 07/10/2022 0934   ALKPHOS 59 07/10/2022 0934   BILITOT 1.0 07/10/2022 0934   BILITOT 0.8 05/30/2022 0857   GFRNONAA 23 (L) 07/11/2022 0512   GFRAA 46 03/16/2020 0000   Lipase  No results found for: "LIPASE"  Studies/Results: No results found.  Anti-infectives: Anti-infectives (From admission, onward)    Start     Dose/Rate Route Frequency Ordered Stop   07/10/22 1000  cefTRIAXone (ROCEPHIN) 2 g in sodium chloride 0.9 % 100 mL IVPB        2 g 200 mL/hr over 30 Minutes Intravenous Every 24 hours 07/10/22 0911          Assessment/Plan POD 1 s/p Diagnostic laparoscopy, exploratory laparotomy with reduction of small bowel volvulus and repair of incarcerated internal hernia at the jejunojejunostomy mesenteric defect containing essentially the entire common channel and part of the Roux limb/ JJ anastomosis  - Dr. Fredricka Bonine 6/18 - Adv to FLD - Stop abx - Add PO pain options (Scheduled Tylenol and PRN Oxy). Change IV Morphine to PRN IV Dilaudid given hx of CKD4.  - D/c Foley - Ambulate - Pulm toilet  FEN - FLD, IVF. Mutli VTE - SCDs, Lovenox  ID - Rocephin 6/18 -6/19 Foley -  D/c  HTN - PRN meds for now. Home meds when taking PO DM2 - SSI Asthma - prn duonebs  If he continues to progress well, plan soft diet tomorrow.   LOS: 1 day    Berna Bue MD [facility] Surgery 07/11/2022, 8:23 AM Please see Amion for pager number during day hours 7:00am-4:30pm

## 2022-07-11 NOTE — TOC CM/SW Note (Signed)
Transition of Care [facility]) - Inpatient Brief Assessment  Patient Details  Name: Vasilis Relles MRN: [id number] Date of Birth: 1950/12/22  [facility] CM/SW Contact:    Ewing Schlein, LCSW Phone Number: 07/11/2022, 12:59 PM  Clinical Narrative: Screening completed. No TOC needs identified at this time.  Transition of Care Asessment: Insurance and Status: Insurance coverage has been reviewed Patient has primary care physician: Yes Home environment has been reviewed: Resides at home with spouse Prior level of function:: Independent at baseline Prior/Current Home Services: No current home services Social Determinants of Health Reivew: SDOH reviewed no interventions necessary Readmission risk has been reviewed: Yes Transition of care needs: no transition of care needs at this time

## 2022-07-12 LAB — GLUCOSE, CAPILLARY
Glucose-Capillary: 123 mg/dL — ABNORMAL HIGH (ref 70–99)
Glucose-Capillary: 162 mg/dL — ABNORMAL HIGH (ref 70–99)
Glucose-Capillary: 73 mg/dL (ref 70–99)
Glucose-Capillary: 145 mg/dL — ABNORMAL HIGH (ref 70–99)

## 2022-07-12 MED ORDER — IRBESARTAN 150 MG PO TABS
300.0000 mg | ORAL_TABLET | Freq: Every day | ORAL | Status: DC
  Administered 2022-07-12 – 2022-07-13 (×2): 300 mg via ORAL
  Filled 2022-07-12 (×2): qty 2

## 2022-07-12 MED ORDER — PANTOPRAZOLE SODIUM 40 MG PO TBEC
40.0000 mg | DELAYED_RELEASE_TABLET | Freq: Every day | ORAL | Status: DC
  Administered 2022-07-12 – 2022-07-13 (×2): 40 mg via ORAL
  Filled 2022-07-12 (×2): qty 1

## 2022-07-12 MED ORDER — HYDROMORPHONE HCL 1 MG/ML IJ SOLN: 0.5000 mg | INTRAMUSCULAR | Status: DC | PRN

## 2022-07-12 MED ORDER — POLYETHYLENE GLYCOL 3350 17 G PO PACK: 17.0000 g | PACK | Freq: Two times a day (BID) | ORAL | Status: DC | PRN

## 2022-07-12 NOTE — Progress Notes (Signed)
2 Days Post-Op  Subjective: CC: Tolerated full liquids. No nausea. + Flatus, no BM. No bloating or pain, has taken no PRN meds since OR.    Objective: Vital signs in last 24 hours: Temp:  [98 F (36.7 C)-98.7 F (37.1 C)] 98.3 F (36.8 C) (06/20 0612) Pulse Rate:  [67-80] 67 (06/20 0612) Resp:  [17-18] 18 (06/20 0612) BP: (153-179)/(77-118) 160/89 (06/20 0612) SpO2:  [97 %-100 %] 97 % (06/20 0612) Last BM Date : 07/08/22  Intake/Output from previous day: 06/19 0701 - 06/20 0700 In: 2802.5 [P.O.:1080; I.V.:1622.5; IV Piggyback:100] Out: 4880 [Urine:4880] Intake/Output this shift: No intake/output data recorded.  PE:  Gen:  Alert, NAD, pleasant HEENT: EOM's intact, pupils equal and round Card:  RRR Pulm:  CTAB, no W/R/R, effort normal Abd: Soft, ND, NT; incisions c/d/I with steri strips, no cellulitis or hematoma Ext:  No LE edema or calf tenderness Psych: A&Ox3  Skin: no rashes noted, warm and dry  Lab Results:  Recent Labs    07/10/22 0934 07/11/22 0512  WBC 14.0* 10.3  HGB 12.5* 10.2*  HCT 38.8* 32.2*  PLT 161 131*    BMET Recent Labs    07/10/22 0934 07/11/22 0512  NA 138 140  K 4.4 4.0  CL 107 112*  CO2 18* 20*  GLUCOSE 208* 177*  BUN 41* 46*  CREATININE 3.04* 2.83*  CALCIUM 8.9 7.6*    PT/INR No results for input(s): "LABPROT", "INR" in the last 72 hours. CMP     Component Value Date/Time   NA 140 07/11/2022 0512   NA 141 05/30/2022 0857   K 4.0 07/11/2022 0512   CL 112 (H) 07/11/2022 0512   CO2 20 (L) 07/11/2022 0512   GLUCOSE 177 (H) 07/11/2022 0512   BUN 46 (H) 07/11/2022 0512   BUN 41 (H) 05/30/2022 0857   CREATININE 2.83 (H) 07/11/2022 0512   CALCIUM 7.6 (L) 07/11/2022 0512   PROT 7.6 07/10/2022 0934   PROT 6.4 05/30/2022 0857   ALBUMIN 4.1 07/10/2022 0934   ALBUMIN 4.1 05/30/2022 0857   AST 25 07/10/2022 0934   ALT 25 07/10/2022 0934   ALKPHOS 59 07/10/2022 0934   BILITOT 1.0 07/10/2022 0934   BILITOT 0.8 05/30/2022  0857   GFRNONAA 23 (L) 07/11/2022 0512   GFRAA 46 03/16/2020 0000   Lipase  No results found for: "LIPASE"  Studies/Results: No results found.  Anti-infectives: Anti-infectives (From admission, onward)    Start     Dose/Rate Route Frequency Ordered Stop   07/10/22 1000  cefTRIAXone (ROCEPHIN) 2 g in sodium chloride 0.9 % 100 mL IVPB  Status:  Discontinued        2 g 200 mL/hr over 30 Minutes Intravenous Every 24 hours 07/10/22 0911 07/11/22 0830        Assessment/Plan POD 2 s/p Diagnostic laparoscopy, exploratory laparotomy with reduction of small bowel volvulus and repair of incarcerated internal hernia at the jejunojejunostomy mesenteric defect containing essentially the entire common channel and part of the Roux limb/ JJ anastomosis  - Dr. Fredricka Bonine 6/18 - Adv to low fiber diet -Stop IVF  FEN - low fiber diet VTE - SCDs, Lovenox  ID - Rocephin 6/18 -6/19 Foley - D/c'd 6/19  HTN -home meds DM2 - SSI Asthma - prn duonebs  Will plan Discharge once he has a bowel movement.    LOS: 2 days    Berna Bue MD [facility] Surgery 07/12/2022, 8:32 AM Please see Amion for pager  number during day hours 7:00am-4:30pm

## 2022-07-13 LAB — GLUCOSE, CAPILLARY: Glucose-Capillary: 167 mg/dL — ABNORMAL HIGH (ref 70–99)

## 2022-07-13 MED ORDER — METHOCARBAMOL 500 MG PO TABS: 500.0000 mg | ORAL_TABLET | Freq: Four times a day (QID) | ORAL | 0 refills | Status: DC | PRN

## 2022-07-13 MED ORDER — OXYCODONE HCL 5 MG PO TABS: 5.0000 mg | ORAL_TABLET | Freq: Four times a day (QID) | ORAL | 0 refills | Status: DC | PRN

## 2022-07-13 NOTE — Progress Notes (Signed)
Patient and wife provided with education, both verbalized understanding. IV's removed.

## 2022-07-13 NOTE — Anesthesia Postprocedure Evaluation (Signed)
Anesthesia Post Note  Patient: Doran Stabler Mchaffie  Procedure(s) Performed: LAPAROSCOPY DIAGNOSTIC; REDUCTION AND REPAIR OF INTERNAL HERNIA EXPLORATORY LAPAROTOMY     Patient location during evaluation: PACU Anesthesia Type: General Level of consciousness: awake and alert Pain management: pain level controlled Vital Signs Assessment: post-procedure vital signs reviewed and stable Respiratory status: spontaneous breathing, nonlabored ventilation, respiratory function stable and patient connected to nasal cannula oxygen Cardiovascular status: blood pressure returned to baseline and stable Postop Assessment: no apparent nausea or vomiting Anesthetic complications: no   No notable events documented.  Last Vitals:  Vitals:   07/12/22 2135 07/13/22 0446  BP: (!) 153/73 (!) 158/79  Pulse: 62 64  Resp: 16 18  Temp: 36.8 C 36.6 C  SpO2: 98% 100%    Last Pain:  Vitals:   07/13/22 1007  TempSrc:   PainSc: 3                  Kennieth Rad

## 2022-07-13 NOTE — Discharge Instructions (Signed)
[facility]  Please arrive at least 30 min before your appointment to complete your check in paperwork.  If you are unable to arrive 30 min prior to your appointment time we may have to cancel or reschedule you. LAPAROSCOPIC SURGERY: POST OP INSTRUCTIONS Always review your discharge instruction sheet given to you by the facility where your surgery was performed. IF YOU HAVE DISABILITY OR FAMILY LEAVE FORMS, YOU MUST BRING THEM TO THE OFFICE FOR PROCESSING.   DO NOT GIVE THEM TO YOUR DOCTOR.  PAIN CONTROL  First take acetaminophen (Tylenol) AND/or ibuprofen (Advil) to control your pain after surgery.  Follow directions on package.  Taking acetaminophen (Tylenol) and/or ibuprofen (Advil) regularly after surgery will help to control your pain and lower the amount of prescription pain medication you may need.  You should not take more than 4,000 mg (4 grams) of acetaminophen (Tylenol) in 24 hours.  You should not take ibuprofen (Advil), aleve, motrin, naprosyn or other NSAIDS with your history of bariatric surgery.  A prescription for pain medication may be given to you upon discharge.  Take your pain medication as prescribed, if you still have uncontrolled pain after taking acetaminophen (Tylenol) Use ice packs to help control pain. If you need a refill on your pain medication, please contact your pharmacy.  They will contact our office to request authorization. Prescriptions will not be filled after 5pm or on week-ends.  HOME MEDICATIONS Take your usually prescribed medications unless otherwise directed.  DIET You should follow a light diet the first few days after arrival home.  Be sure to include lots of fluids daily. Avoid fatty, fried foods.   CONSTIPATION It is common to experience some constipation after surgery and if you are taking pain medication.  Increasing fluid intake and taking a stool softener (such as Colace) will usually help or prevent this problem from  occurring.  A mild laxative (Milk of Magnesia or Miralax) should be taken according to package instructions if there are no bowel movements after 48 hours.  WOUND/INCISION CARE Most patients will experience some swelling and bruising in the area of the incisions.  Ice packs will help.  Swelling and bruising can take several days to resolve.  Unless discharge instructions indicate otherwise, follow guidelines below  STERI-STRIPS - you may remove your outer bandages 48 hours after surgery, and you may shower at that time.  You have steri-strips (small skin tapes) in place directly over the incision.  These strips should be left on the skin for 7-10 days.   DERMABOND/SKIN GLUE - you may shower in 24 hours.  The glue will flake off over the next 2-3 weeks. Any sutures or staples will be removed at the office during your follow-up visit.  ACTIVITIES You may resume regular (light) daily activities beginning the next day--such as daily self-care, walking, climbing stairs--gradually increasing activities as tolerated.  You may have sexual intercourse when it is comfortable.  Refrain from any heavy lifting or straining until approved by your doctor. You may drive when you are no longer taking prescription pain medication, you can comfortably wear a seatbelt, and you can safely maneuver your car and apply brakes.  FOLLOW-UP You should see your doctor in the office for a follow-up appointment approximately 2-3 weeks after your surgery.  You should have been given your post-op/follow-up appointment when your surgery was scheduled.  If you did not receive a post-op/follow-up appointment, make sure that you call for this appointment within a day  or two after you arrive home to insure a convenient appointment time.   WHEN TO CALL YOUR DOCTOR: Fever over 101.0 Inability to urinate Continued bleeding from incision. Increased pain, redness, or drainage from the incision. Increasing abdominal pain  The clinic  staff is available to answer your questions during regular business hours.  Please don't hesitate to call and ask to speak to one of the nurses for clinical concerns.  If you have a medical emergency, go to the nearest emergency room or call 911.  A surgeon from [facility] is always on call at the hospital. [address], [address], [place], [place]  [address] ? [address] [address], [place], [place]   [address] [phone] ? [phone] ? FAX [phone]

## 2022-07-13 NOTE — Discharge Summary (Signed)
Patient ID: Allen [name] - [facility] [id number] [birth year] 72 y.o.  Admit date: 07/10/2022 Discharge date: 07/13/2022  Discharge Diagnosis S/p Diagnostic laparoscopy, exploratory laparotomy with reduction of small bowel volvulus and repair of incarcerated internal hernia at the jejunojejunostomy mesenteric defect containing essentially the entire common channel and part of the Roux limb/ JJ anastomosis  - Dr. Fredricka Bonine 6/18   Consultants None  Reason for Admission:  Allen [name] is a 72 y.o. male with hx of HTN, CKD4, DM2, asthma and prior gastric bypass that was transferred from [facility] for reported possible internal hernia.  Patient reports that yesterday around 11:30 in the afternoon he began having gradual onset of lower abdominal pain.  Pain is constant, has gradually worsened and is currently a 9/10.  Associated nausea and dry heaves.  Denies fever, chest pain, shortness of breath, vomiting, urinary symptoms or diarrhea.  Last episode of flatus and BM were 2 days ago.  No melena or hematochezia at that time.  Reports he had a history of similar lower abdominal pain last year when he was at [facility] but was much less severe.  He was seen at an ED in [facility] and reports that his workup was "negative". Denies NSAID use.    Patient has a history of Roux-en-Y gastric bypass by Dr. Doylene Canard in 2018.  In chart review it looks like she has seen him several times in the office for issues with recurrent weight loss, nausea, fatigue and early satiety. He reports he has lost ~100lbs since his surgery in 2018 and currently weighs 171lbs.    Prior Abdominal Surgeries: Cholecystectomy. Roux-en-Y gastric bypass by Dr. Doylene Canard in 2018 Blood Thinners: None Allergies: Levofloxacin, Baclofen Tobacco Use: None Alcohol Use: None Substance use: None  Employment: Part time for [employer]   Denies a history of MI, CVA, asthma, or COPD. At baseline states he mobilizes without an assistive device and can  climb a flight of stairs without stopping due to DOE or chest pain.  Procedures Dr. Fredricka Bonine - Diagnostic laparoscopy, exploratory laparotomy with reduction of small bowel volvulus and repair of incarcerated internal hernia at the jejunojejunostomy mesenteric defect containing essentially the entire common channel and part of the Roux limb/ JJ anastomosis  - 6/18   Hospital Course:  Patient presented as above. Repeat labs with leukocytosis and lactic acidosis. He was taken to the OR for dx laparoscopy by Dr. Fredricka Bonine. He was found to have approximately 2.5 cm defect in the base of the jejunojejunostomy mesentery through which nearly the entire common channel and a portion of the Roux limb along with the jejunal anastomosis had herniated; bowel all appeared viable. He underwent above procedure for this. Tolerated procedure well. Diet was advanced and tolerated. On POD 3, the patient was voiding well, tolerating diet, having bowel function, ambulating well, pain well controlled, vital signs stable, incisions c/d/i and felt stable for discharge home. Discussed discharge instructions, restrictions and return/call back precautions.   Physical Exam: Gen:  Alert, NAD, pleasant Card:  Reg Pulm:  CTAB, no W/R/R, effort normal Abd: Soft, ND, NT; incisions c/d/I with steri strips, no cellulitis or hematoma Ext:  No LE edema  Psych: A&Ox3   Allergies as of 07/13/2022       Reactions   Gramineae Pollens Cough   Levofloxacin Nausea And Vomiting   Baclofen Anxiety, Other (See Comments)   Alters mental state        Medication List     TAKE these medications  acetaminophen 500 MG tablet Commonly known as: TYLENOL Take 1,000 mg by mouth every 8 (eight) hours as needed for mild pain.   albuterol 108 (90 Base) MCG/ACT inhaler Commonly known as: ProAir HFA 2 puffs every 4 hours as needed only  if your can't catch your breath What changed:  how much to take how to take this when to take  this reasons to take this additional instructions   amLODipine 10 MG tablet Commonly known as: NORVASC Take 10 mg by mouth daily.   azithromycin 500 MG tablet Commonly known as: ZITHROMAX Take 1 tablet (500 mg total) by mouth every Monday, Wednesday, and Friday.   calcitRIOL 0.25 MCG capsule Commonly known as: ROCALTROL Take 0.25 mcg by mouth daily.   CALCIUM 500+D3 PO Take 1 tablet by mouth 3 (three) times daily.   calcium carbonate 500 MG chewable tablet Commonly known as: TUMS - dosed in mg elemental calcium Chew 1-2 tablets by mouth 3 (three) times daily as needed for indigestion or heartburn.   Dupilumab 300 MG/2ML Sopn Inject 300 mg into the skin every 14 (fourteen) days.   escitalopram 20 MG tablet Commonly known as: LEXAPRO Take 20 mg by mouth daily.   famotidine 20 MG tablet Commonly known as: Pepcid One after supper   hydrochlorothiazide 12.5 MG tablet Commonly known as: HYDRODIURIL Take 12.5 mg by mouth daily.   MAGNESIUM CITRATE PO Take 1 tablet by mouth daily. Chew Gummies: 200 mg   methocarbamol 500 MG tablet Commonly known as: ROBAXIN Take 1 tablet (500 mg total) by mouth every 6 (six) hours as needed for muscle spasms.   metoCLOPramide 5 MG tablet Commonly known as: REGLAN Take 5 mg by mouth in the morning, at noon, and at bedtime.   multivitamin with minerals Tabs tablet Take 1 tablet by mouth daily.   olmesartan 40 MG tablet Commonly known as: BENICAR Take 40 mg by mouth daily.   oxyCODONE 5 MG immediate release tablet Commonly known as: Oxy IR/ROXICODONE Take 1 tablet (5 mg total) by mouth every 6 (six) hours as needed for breakthrough pain.   pantoprazole 40 MG tablet Commonly known as: Protonix Take 1 tablet (40 mg total) by mouth daily. Take 30-60 min before first meal of the day   Trelegy Ellipta 200-62.5-25 MCG/ACT Aepb Generic drug: Fluticasone-Umeclidin-Vilant Inhale 1 Inhalation into the lungs in the morning. What changed:   when to take this reasons to take this   Trulicity 0.75 MG/0.5ML Sopn Generic drug: Dulaglutide Inject 0.75 mg into the skin once a week.   Vitamin B12 1000 MCG Tbcr Take 1 tablet by mouth daily.         Follow-up Information     Berna Bue, MD. Go on 08/09/2022.   Specialty: General Surgery Why: 08/09/22 at 9:50 am. Please arrive 30 minutes early to complete check in, and bring photo ID and insurance card. Contact information: [address] [place] [address] [phone]                   Signed: Leary Roca, [facility] 07/13/2022, 10:16 AM Please see Amion for pager number during day hours 7:00am-4:30pm

## 2022-10-15 ENCOUNTER — Other Ambulatory Visit: Payer: Self-pay | Admitting: Allergy & Immunology

## 2022-12-13 ENCOUNTER — Encounter: Payer: Self-pay | Admitting: "Endocrinology

## 2022-12-13 ENCOUNTER — Ambulatory Visit: Payer: Medicare Other | Admitting: "Endocrinology

## 2022-12-13 VITALS — BP 136/82 | HR 52 | Ht 70.0 in | Wt 170.8 lb

## 2022-12-13 DIAGNOSIS — E119 Type 2 diabetes mellitus without complications: Secondary | ICD-10-CM

## 2022-12-13 DIAGNOSIS — Z13820 Encounter for screening for osteoporosis: Secondary | ICD-10-CM

## 2022-12-13 DIAGNOSIS — M8589 Other specified disorders of bone density and structure, multiple sites: Secondary | ICD-10-CM

## 2022-12-13 DIAGNOSIS — I10 Essential (primary) hypertension: Secondary | ICD-10-CM | POA: Diagnosis not present

## 2022-12-13 DIAGNOSIS — Z7985 Long-term (current) use of injectable non-insulin antidiabetic drugs: Secondary | ICD-10-CM | POA: Diagnosis not present

## 2022-12-13 DIAGNOSIS — N189 Chronic kidney disease, unspecified: Secondary | ICD-10-CM

## 2022-12-13 DIAGNOSIS — E1122 Type 2 diabetes mellitus with diabetic chronic kidney disease: Secondary | ICD-10-CM

## 2022-12-13 DIAGNOSIS — E782 Mixed hyperlipidemia: Secondary | ICD-10-CM | POA: Diagnosis not present

## 2022-12-13 LAB — POCT GLYCOSYLATED HEMOGLOBIN (HGB A1C): HbA1c, POC (controlled diabetic range): 6.3 % (ref 0.0–7.0)

## 2022-12-13 MED ORDER — TIRZEPATIDE 2.5 MG/0.5ML ~~LOC~~ SOAJ: 2.5000 mg | SUBCUTANEOUS | 1 refills | Status: DC

## 2022-12-13 NOTE — Progress Notes (Signed)
12/13/2022   Endocrinology follow-up note   Subjective:    Patient ID: Allen [name], male    DOB: [birth year]. Patient is being seen in follow-up for the management of type 2 diabetes.    Vasireddy, Daneen Schick, MD  Past Medical History:  Diagnosis Date   Anemia    on iron since gastric bypass   Arthritis    mild   Asthma    Chronic kidney disease    stage 3, now resolved since weight loss   Claustrophobia    Diabetes mellitus, type II (HCC)    type 2 metformin   GERD (gastroesophageal reflux disease)    takes nexium   Hyperlipidemia    Hypertension    Pneumonia 15 yrs ago   SCC (squamous cell carcinoma) 12/29/2007   Left Mid Preauricular(Mod.Diff) (Dr. Adriana Simas)   SCC (squamous cell carcinoma) 12/29/2007   Right Upper Dorsal Nose(Well Diff) (Dr. Adriana Simas)   SCC (squamous cell carcinoma) 12/29/2007   Anti-Tragus Right Ear(Mod.Diff) (Dr. Adriana Simas)   SCC (squamous cell carcinoma) 06/06/2010   Left Nose(in Situ) (curet and 5FU)   SCC (squamous cell carcinoma) 06/06/2010   Right Check(Well Diff) (curet and 5FU)   SCC (squamous cell carcinoma) 07/20/2013   Right Sideburn(KA) (curet and 5FU)   SCC (squamous cell carcinoma) 06/27/2015   Right Neck Sup(in Situ) (curet and 5FU)   SCC (squamous cell carcinoma) 06/27/2015   Right Neck Inf(in Situ) (curet and 5FU)   SCC (squamous cell carcinoma) 01/02/2017   Left Wrist(in Situ) (curet and 5FU)   SCC (squamous cell carcinoma) 01/02/2017   Right Outer Cheek(in Situ) (curet and 5FU)   SCC (squamous cell carcinoma) 06/23/2018   Left Sideburn(in Situ) (curet and 5FU)   SCC (squamous cell carcinoma) 06/23/2018   Left Upper Lip(Well Diff) (MOH's)   SCC (squamous cell carcinoma) 06/23/2018   Right Ear Rim(in Situ) (curet and 5FU)   Sleep apnea    cpap, no cpap since may 2018 weight loss 80 labs    Superficial nodular basal cell carcinoma (BCC) 06/27/2015   Left Rim of Ear (curet and 5FU)   Past Surgical History:  Procedure  Laterality Date   ANTERIOR CERVICAL DECOMP/DISCECTOMY FUSION Right 10/10/2018   Procedure: Right sided revision with hardware removal at Cervical three-four, Cervical four-five Anterior cervical decompression/discectomy/fusion with corpectomy at Cervical five;  Surgeon: Maeola Harman, MD;  Location: [facility] OR;  Service: Neurosurgery;  Laterality: Right;   CARDIAC CATHETERIZATION     patient reports he thinks he had one over 25 years ago   carpal tunnel Bilateral    CERVICAL VERTEBRAE EXCISION     and cleaning   CHOLECYSTECTOMY     cyst removed left hand  01-28-17   ESOPHAGOGASTRODUODENOSCOPY (EGD) WITH PROPOFOL N/A 01/31/2017   Procedure: ESOPHAGOGASTRODUODENOSCOPY (EGD) WITH PROPOFOL;  Surgeon: Ovidio Kin, MD;  Location: [facility];  Service: General;  Laterality: N/A;   EYE SURGERY     bilateral cataract removal   GASTRIC ROUX-EN-Y N/A 06/04/2016   Procedure: LAPAROSCOPIC ROUX-EN-Y GASTRIC BYPASS WITH UPPER ENDOSCOPY;  Surgeon: Berna Bue, MD;  Location: [facility];  Service: General;  Laterality: N/A;   LAPAROSCOPY N/A 07/10/2022   Procedure: LAPAROSCOPY DIAGNOSTIC; REDUCTION AND REPAIR OF INTERNAL HERNIA;  Surgeon: Berna Bue, MD;  Location: [facility];  Service: General;  Laterality: N/A;   LAPAROTOMY N/A 07/10/2022   Procedure: EXPLORATORY LAPAROTOMY;  Surgeon: Berna Bue, MD;  Location: [facility];  Service: General;  Laterality: N/A;   NASAL SINUS  SURGERY     POSTERIOR CERVICAL FUSION/FORAMINOTOMY N/A 06/08/2019   Procedure: Cervical Three to Cervical Seven Posterior cervical decompression and fusion;  Surgeon: Maeola Harman, MD;  Location: [facility] OR;  Service: Neurosurgery;  Laterality: N/A;  Cervical Three to Cervical Seven Posterior cervical decompression and fusion   RADIOLOGY WITH ANESTHESIA N/A 04/28/2019   Procedure: MRI RADIOLOGY WITH ANESTHESIA;  Surgeon: Radiologist, Medication, MD;  Location: [facility] OR;  Service: Radiology;  Laterality: N/A;   TRIGGER FINGER RELEASE     Social  History   Socioeconomic History   Marital status: Married    Spouse name: Not on file   Number of children: Not on file   Years of education: Not on file   Highest education level: Not on file  Occupational History   Not on file  Tobacco Use   Smoking status: Never    Passive exposure: Never   Smokeless tobacco: Never  Vaping Use   Vaping status: Never Used  Substance and Sexual Activity   Alcohol use: No   Drug use: No   Sexual activity: Yes  Other Topics Concern   Not on file  Social History Narrative   Not on file   Social Determinants of Health   Financial Resource Strain: Not on file  Food Insecurity: Unknown (07/10/2022)   Hunger Vital Sign    Worried About Running Out of Food in the Last Year: Never true    Ran Out of Food in the Last Year: Patient declined  Transportation Needs: No Transportation Needs (07/10/2022)   PRAPARE - Administrator, Civil Service (Medical): No    Lack of Transportation (Non-Medical): No  Physical Activity: Not on file  Stress: Not on file  Social Connections: Not on file   Outpatient Encounter Medications as of 12/13/2022  Medication Sig   tirzepatide (MOUNJARO) 2.5 MG/0.5ML Pen Inject 2.5 mg into the skin once a week.   acetaminophen (TYLENOL) 500 MG tablet Take 1,000 mg by mouth every 8 (eight) hours as needed for mild pain.   albuterol (PROAIR HFA) 108 (90 Base) MCG/ACT inhaler 2 puffs every 4 hours as needed only  if your can't catch your breath (Patient taking differently: Inhale 1 puff into the lungs every 6 (six) hours as needed for shortness of breath.)   amLODipine (NORVASC) 10 MG tablet Take 10 mg by mouth daily.   Calcium Carb-Cholecalciferol (CALCIUM 500+D3 PO) Take 1 tablet by mouth 3 (three) times daily.    calcium carbonate (TUMS - DOSED IN MG ELEMENTAL CALCIUM) 500 MG chewable tablet Chew 1-2 tablets by mouth 3 (three) times daily as needed for indigestion or heartburn.   Cyanocobalamin (VITAMIN B12) 1000 MCG  TBCR Take 1 tablet by mouth daily.   Dupilumab 300 MG/2ML SOPN Inject 300 mg into the skin every 14 (fourteen) days.   escitalopram (LEXAPRO) 20 MG tablet Take 20 mg by mouth daily.   famotidine (PEPCID) 20 MG tablet One after supper (Patient not taking: Reported on 07/10/2022)   hydrochlorothiazide (HYDRODIURIL) 12.5 MG tablet Take 12.5 mg by mouth daily.   MAGNESIUM CITRATE PO Take 1 tablet by mouth daily. Chew Gummies: 200 mg   metoCLOPramide (REGLAN) 5 MG tablet Take 5 mg by mouth in the morning, at noon, and at bedtime.   Multiple Vitamin (MULTIVITAMIN WITH MINERALS) TABS tablet Take 1 tablet by mouth daily.   olmesartan (BENICAR) 40 MG tablet Take 40 mg by mouth daily.   pantoprazole (PROTONIX) 40 MG tablet Take 1 tablet (  40 mg total) by mouth daily. Take 30-60 min before first meal of the day   TRELEGY ELLIPTA 200-62.5-25 MCG/ACT AEPB INHALE 1 INHALATION INTO THE LUNGS IN THE MORNING.   [DISCONTINUED] calcitRIOL (ROCALTROL) 0.25 MCG capsule Take 0.25 mcg by mouth daily.   [DISCONTINUED] Dulaglutide (TRULICITY) 0.75 MG/0.5ML SOPN Inject 0.75 mg into the skin once a week.   [DISCONTINUED] methocarbamol (ROBAXIN) 500 MG tablet Take 1 tablet (500 mg total) by mouth every 6 (six) hours as needed for muscle spasms.   [DISCONTINUED] oxyCODONE (OXY IR/ROXICODONE) 5 MG immediate release tablet Take 1 tablet (5 mg total) by mouth every 6 (six) hours as needed for breakthrough pain.   Facility-Administered Encounter Medications as of 12/13/2022  Medication   dupilumab (DUPIXENT) prefilled syringe 300 mg   ALLERGIES: Allergies  Allergen Reactions   Gramineae Pollens Cough   Levofloxacin Nausea And Vomiting   Baclofen Anxiety and Other (See Comments)    Alters mental state   VACCINATION STATUS: Immunization History  Administered Date(s) Administered   Influenza, High Dose Seasonal PF 10/19/2017, 10/29/2018   Influenza-Unspecified 10/22/2017   Moderna Sars-Covid-2 Vaccination 02/14/2019,  09/16/2019, 05/02/2020   Tdap 12/23/2017   Zoster Recombinant(Shingrix) 12/23/2017    Diabetes He presents for his follow-up diabetic visit. He has type 2 diabetes mellitus. Onset time: He was diagnosed at approximate age of 45 years. His disease course has been stable. There are no hypoglycemic associated symptoms. Pertinent negatives for hypoglycemia include no confusion, headaches, pallor or seizures. Pertinent negatives for diabetes include no chest pain, no polydipsia, no polyphagia, no polyuria and no weakness. There are no hypoglycemic complications. Symptoms are stable. Diabetic complications include nephropathy. Risk factors for coronary artery disease include diabetes mellitus, dyslipidemia, hypertension and male sex. His weight is decreasing steadily (He is status post Roux-en-Y gastric bypass-after successfully losing 135 pounds.). He is following a diabetic diet. When asked about meal planning, he reported none. He has not had a previous visit with a dietitian. He participates in exercise intermittently (He participates in golfing activities.). His home blood glucose trend is decreasing steadily. Allen [name] presents with A1c of 6.3%. -He is currently on Trulicity 0.75 mg subcutaneously weekly.  He has no hypoglycemia.  He maintained 135 pounds weight loss after his surgery.  He is status post Roux-en-Y surgery.  ) An ACE inhibitor/angiotensin II receptor blocker is being taken. Eye exam is current.  Hyperlipidemia This is a chronic problem. The current episode started more than 1 year ago. The problem is uncontrolled. Exacerbating diseases include chronic renal disease and diabetes. He has no history of obesity. Pertinent negatives include no chest pain, myalgias or shortness of breath. Current antihyperlipidemic treatment includes statins and bile acid squestrants. Compliance problems include adherence to diet.  Risk factors for coronary artery disease include diabetes mellitus, dyslipidemia,  hypertension, male sex, obesity and a sedentary lifestyle.  Hypertension This is a chronic problem. The current episode started more than 1 year ago. The problem is uncontrolled. Pertinent negatives include no chest pain, headaches, neck pain, palpitations or shortness of breath. Risk factors for coronary artery disease include diabetes mellitus, dyslipidemia and sedentary lifestyle. Past treatments include angiotensin blockers. Hypertensive end-organ damage includes kidney disease. Identifiable causes of hypertension include chronic renal disease.   Interval history: In June 2021, he developed abdominal pain which subsequently showed internal hernia for which he was seen and treated at [facility]: He is currently s/p Diagnostic laparoscopy, exploratory laparotomy with reduction of small bowel volvulus and repair of  incarcerated internal hernia at the jejunojejunostomy mesenteric defect containing essentially the entire common channel and part of the Roux limb/ JJ anastomosis  - Dr. Fredricka Bonine 6/18  He has recovered from the surgery very well.  Objective:    BP 136/82   Pulse (!) 52   Ht 5\' 10"  (1.778 m)   Wt 170 lb 12.8 oz (77.5 kg)   BMI 24.51 kg/m   Wt Readings from Last 3 Encounters:  12/13/22 170 lb 12.8 oz (77.5 kg)  07/10/22 170 lb (77.1 kg)  06/06/22 171 lb 12.8 oz (77.9 kg)     Recent Results (from the past 2160 hour(s))  HgB A1c     Status: None   Collection Time: 12/13/22 11:38 AM  Result Value Ref Range   Hemoglobin A1C     HbA1c POC (<> result, manual entry)     HbA1c, POC (prediabetic range)     HbA1c, POC (controlled diabetic range) 6.3 0.0 - 7.0 %   Lipid Panel     Component Value Date/Time   CHOL 168 05/30/2022 0857   TRIG 100 05/30/2022 0857   HDL 56 05/30/2022 0857   CHOLHDL 3.0 05/30/2022 0857   CHOLHDL 3.9 04/25/2016 1119   VLDL 27 04/25/2016 1119   LDLCALC 94 05/30/2022 0857     Assessment & Plan:   1.  type 2 diabetes mellitus  - Patient has  currently controlled asymptomatic type 2 DM since  72 years of age.  Patient is status post Roux-en-Y gastric bypass.  Allen [name] presents with A1c of 6.3%. -He is tolerating Trulicity 0.75 mg subcutaneously weekly, however the cost of this medication is becoming a concern.  I discussed and the switch prescription to Mounjaro 2.5 mg subcutaneously weekly to see if this has better coverage.   He would not need additional intervention for diabetes at this time. He maintained 135 pounds weight loss after his surgery.  He is status post Roux-en-Y surgery.  He is status post exploratory surgery to repair an internal hernia which was incarcerated and causing acute abdomen in June 2024.   - His diabetes is complicated by mild  CKD  and patient remains at a high risk for more acute and chronic complications of diabetes which include CAD, CVA, CKD, retinopathy, and neuropathy. These are all discussed in detail with the patient.  - I have counseled the patient on diet management and weight loss, by adopting a carbohydrate restricted/protein rich diet.  - he acknowledges that there is a room for improvement in his food and drink choices. - Suggestion is made for him to avoid simple carbohydrates  from his diet including Cakes, Sweet Desserts, Ice Cream, Soda (diet and regular), Sweet Tea, Candies, Chips, Cookies, Store Bought Juices, Alcohol , Artificial Sweeteners,  Coffee Creamer, and "Sugar-free" Products, Lemonade. This will help patient to have more stable blood glucose profile and potentially avoid unintended weight gain.  The following Lifestyle Medicine recommendations according to American College of Lifestyle Medicine  [facility]) were discussed and and offered to patient and he  agrees to start the journey:  A. Whole Foods, Plant-Based Nutrition comprising of fruits and vegetables, plant-based proteins, whole-grain carbohydrates was discussed in detail with the patient.   A list for source of those  nutrients were also provided to the patient.  Patient will use only water or unsweetened tea for hydration. B.  The need to stay away from risky substances including alcohol, smoking; obtaining 7 to 9 hours of restorative sleep, at least  150 minutes of moderate intensity exercise weekly, the importance of healthy social connections,  and stress management techniques were discussed. C.  A full color page of  Calorie density of various food groups per pound showing examples of each food groups was provided to the patient.  - Patient specific target  A1c;  LDL, HDL, Triglycerides,  were discussed in detail.  2) BP/HTN: -His blood pressure is controlled to target.  He remains on amlodipine 5 mg p.o. daily, Benicar 40 mg p.o. daily.  Whole food plant-based diet discussed above will help with blood pressure.  He wishes to address his blood pressure management with his nephrologist.    3) Lipids/HPL: Recent lipid panel showed LDL at 94, dropping from 123.  He is not on statins, would like to address it with his PMD.      4)  Weight/Diet: Status post Roux-en-Y gastric bypass, successfully lost up to 135 pounds.   His current weight is optimal for him.  CDE Consult in progress , exercise, and detailed carbohydrates information provided.  5) Chronic Care/Health Maintenance:  -Patient is on ACEI/ARB and Statin medications and encouraged to continue to follow up with Ophthalmology, Podiatrist at least yearly or according to recommendations, and advised to   stay away from smoking. I have recommended yearly flu vaccine and pneumonia vaccination at least every 5 years; moderate intensity exercise for up to 150 minutes weekly; and  sleep for at least 7 hours a day. -He is advised to get his colonoscopy through GI.  His constipation is likely due to his pain medications.  -This patient will benefit from screening bone density.  I discussed and ordered this test for him to obtain at density imaging  facility.   His screening ABI was negative for PAD in March 2022.  This study will be repeated in March 2027, or sooner if needed.  - I advised patient to maintain close follow up with Vasireddy, Daneen Schick, MD for primary care needs.   I spent  26  minutes in the care of the patient today including review of labs from Thyroid Function, CMP, and other relevant labs ; imaging/biopsy records (current and previous including abstractions from other facilities); face-to-face time discussing  his lab results and symptoms, medications doses, his options of short and long term treatment based on the latest standards of care / guidelines;   and documenting the encounter.  Allen [name]  participated in the discussions, expressed understanding, and voiced agreement with the above plans.  All questions were answered to his satisfaction. he is encouraged to contact clinic should he have any questions or concerns prior to his return visit.    Follow up plan: In 6 months with labs. Marquis Lunch, MD Phone: [phone]  Fax: [phone]  -  This note was partially dictated with voice recognition software. Similar sounding words can be transcribed inadequately or may not  be corrected upon review.  12/13/2022, 1:44 PM

## 2022-12-13 NOTE — Patient Instructions (Signed)

## 2022-12-26 ENCOUNTER — Ambulatory Visit: Payer: Medicare Other | Admitting: Allergy & Immunology

## 2022-12-26 ENCOUNTER — Encounter: Payer: Self-pay | Admitting: Allergy & Immunology

## 2022-12-26 VITALS — BP 154/86 | HR 69 | Temp 98.4°F | Resp 16 | Ht 71.0 in | Wt 175.0 lb

## 2022-12-26 DIAGNOSIS — J3089 Other allergic rhinitis: Secondary | ICD-10-CM | POA: Diagnosis not present

## 2022-12-26 DIAGNOSIS — J302 Other seasonal allergic rhinitis: Secondary | ICD-10-CM | POA: Diagnosis not present

## 2022-12-26 DIAGNOSIS — J4550 Severe persistent asthma, uncomplicated: Secondary | ICD-10-CM | POA: Diagnosis not present

## 2022-12-26 DIAGNOSIS — D806 Antibody deficiency with near-normal immunoglobulins or with hyperimmunoglobulinemia: Secondary | ICD-10-CM

## 2022-12-26 MED ORDER — BREZTRI AEROSPHERE 160-9-4.8 MCG/ACT IN AERO: 2.0000 | INHALATION_SPRAY | Freq: Two times a day (BID) | RESPIRATORY_TRACT | 5 refills | Status: AC

## 2022-12-26 NOTE — Patient Instructions (Addendum)
1. Moderate persistent asthma, uncomplicated - Lung testing not done today. - We are starting Breztri in place of Trelegy. - This might be more affordable and if not, we will submit for [place] and [place] coverage which will provide medication for free.  - Daily controller medication(s): Breztri two puffs twice daily + Dupixent every two weeks - Prior to physical activity: albuterol 2 puffs 10-15 minutes before physical activity. - Rescue medications: albuterol 4 puffs every 4-6 hours as needed - Asthma control goals:  * Full participation in all desired activities (may need albuterol before activity) * Albuterol use two time or less a week on average (not counting use with activity) * Cough interfering with sleep two time or less a month * Oral steroids no more than once a year * No hospitalizations  2. Recurrent infections - The Dupixent has helped decrease infections. - This is working so well.   3. Chronic rhinitis (grasses, cat, dust mites, cockroach) - We can always pursue allergy shots in the future if needed.   4. Return in about 6 months (around 06/26/2023).    Please inform us of any Emergency Department visits, hospitalizations, or changes in symptoms. Call us before going to the ED for breathing or allergy symptoms since we might be able to fit you in for a sick visit. Feel free to contact us anytime with any questions, problems, or concerns.  It was a pleasure to see you guys today!   Websites that have reliable patient information: 1. American Academy of Asthma, Allergy, and Immunology: www.aaaai.org 2. Food Allergy Research and Education (FARE): foodallergy.org 3. Mothers of Asthmatics: http://www.asthmacommunitynetwork.org 4. American College of Allergy, Asthma, and Immunology: www.acaai.org   COVID-19 Vaccine Information can be found at: [email or web address] For questions related to vaccine distribution or appointments,  please email [email or web address] or call [phone].   We realize that you might be concerned about having an allergic reaction to the COVID19 vaccines. To help with that concern, WE ARE OFFERING THE COVID19 VACCINES IN OUR OFFICE! Ask the front desk for dates!     "Like" [name] on Facebook and Instagram for our latest updates!      A healthy democracy works best when [employer] participate! Make sure you are registered to vote! If you have moved or changed any of your contact information, you will need to get this updated before voting!  In some cases, you MAY be able to register to vote online: [email or web address]

## 2022-12-26 NOTE — Progress Notes (Signed)
FOLLOW UP  Date of Service/Encounter:  12/26/22   Assessment:   Moderate persistent asthma, uncomplicated - with improvement on Trelegy + Dupixent   Recurrent infections - with inadequate protection against Streptococcus pneumonia despite vaccination (doing well on Dupixent every two weeks)   Chronic rhinitis - previously on allergen immunotherapy through someone in [place] in the early 1990s    S/p gastric bypass in 2018 and lost 100+ pounds (and has kept it off)   Type 2 diabetes mellitus - controlled with Trulicity once weekly (sees Dr. Fransico Him)    GERD - on Protonix   Never smoker  Plan/Recommendations:   1. Moderate persistent asthma, uncomplicated - Lung testing not done today. - We are starting Breztri in place of Trelegy. - This might be more affordable and if not, we will submit for [place] and [place] coverage which will provide medication for free.  - Daily controller medication(s): Breztri two puffs twice daily + Dupixent every two weeks - Prior to physical activity: albuterol 2 puffs 10-15 minutes before physical activity. - Rescue medications: albuterol 4 puffs every 4-6 hours as needed - Asthma control goals:  * Full participation in all desired activities (may need albuterol before activity) * Albuterol use two time or less a week on average (not counting use with activity) * Cough interfering with sleep two time or less a month * Oral steroids no more than once a year * No hospitalizations  2. Recurrent infections - The Dupixent has helped decrease infections. - This is working so well.   3. Chronic rhinitis (grasses, cat, dust mites, cockroach) - We can always pursue allergy shots in the future if needed.   4. Return in about 6 months (around 06/26/2023).   Subjective:   Allen [name] is a 72 y.o. male presenting today for follow up of  Chief Complaint  Patient presents with   Asthma    Says he can tell an improvement.     Allen [name]  has a history of the following: Patient Active Problem List   Diagnosis Date Noted   Osteoporosis screening 12/13/2022   Hypocalcemia 12/13/2022   Hx of gastric bypass 07/10/2022   Chronic asthma, moderate persistent, uncomplicated 11/11/2019   Cervical pseudoarthrosis (HCC) 06/08/2019   Vitamin D deficiency 05/19/2019   Herniated cervical disc without myelopathy 10/10/2018   Uncontrolled type 2 diabetes mellitus with hyperglycemia (HCC) 04/22/2015   Mixed hyperlipidemia 04/22/2015   Essential hypertension, benign 04/22/2015   Obesity 04/22/2015    History obtained from: chart review and patient and wife .  Discussed the use of AI scribe software for clinical note transcription with the patient and/or guardian, who gave verbal consent to proceed.  Voyle is a 72 y.o. male presenting for a follow up visit.  He was last seen in my 2024.  At that time, we continue with Trelegy 200 mcg 1 puff once daily as well as Dupixent every 2 weeks.  For his recurrent infections, he was doing very well on azithromycin 500 mg 3 times weekly.  For his rhinitis, he continued with his allergy medications.  He was sensitized to grasses, cats, dust mites, and cockroach to last time we tested.  Since last visit, he has done well.   Asthma/Respiratory Symptom History: Allen [name] reports significant improvement in his condition, attributing the improvement to the use of Dupixent. He reported no recent visits to the ENT doctor, which was a change from his previous frequent visits. He is currently on a regimen  of Dupixent, albuterol, and Trelegy. The Trelegy is too expensive - nearly $150 per month. He has been sleeping well and has been doing well.  Overall, the patient's asthma has been well-controlled with his current regimen, and he has recovered well from his recent surgery.   Allergic Rhinitis Symptom History: Allergic rhinitis symptoms are nearly nonexistent.  He did have allergy shots for years in the distant past.   The Dupixent has been excellent for controlling any breakthrough symptoms.  He has not seen his ENT at all since starting Dupixent, which is phenomenal since he used to go there every week or two. He has not needed any antibiotics for breakthrough symptoms at all.   However, the patient had an emergency surgery in June due to an internal hernia. He experienced severe stomach pain and was found to have narrowing of the blood vessels in his bowel and a small hole at the bottom of the bowel. The surgery was successful and the patient has since recovered well.  Otherwise, there have been no changes to his past medical history, surgical history, family history, or social history.    Review of systems otherwise negative other than that mentioned in the HPI.    Objective:   Blood pressure (!) 154/86, pulse 69, temperature 98.4 F (36.9 C), temperature source Temporal, resp. rate 16, height 5\' 11"  (1.803 m), weight 175 lb (79.4 kg), SpO2 98%. Body mass index is 24.41 kg/m.    Physical Exam Vitals reviewed.  Constitutional:      Appearance: He is well-developed.  HENT:     Head: Normocephalic and atraumatic.     Right Ear: Tympanic membrane, ear canal and external ear normal. No drainage, swelling or tenderness. Tympanic membrane is not injected, scarred, erythematous, retracted or bulging.     Left Ear: Tympanic membrane, ear canal and external ear normal. No drainage, swelling or tenderness. Tympanic membrane is not injected, scarred, erythematous, retracted or bulging.     Nose: No nasal deformity, septal deviation, mucosal edema or rhinorrhea.     Right Turbinates: Enlarged and swollen.     Left Turbinates: Enlarged and swollen.     Right Sinus: No maxillary sinus tenderness or frontal sinus tenderness.     Left Sinus: No maxillary sinus tenderness or frontal sinus tenderness.     Mouth/Throat:     Mouth: Mucous membranes are not pale and not dry.     Pharynx: Uvula midline.  Eyes:      General:        Right eye: No discharge.        Left eye: No discharge.     Conjunctiva/sclera: Conjunctivae normal.     Right eye: Right conjunctiva is not injected. No chemosis.    Left eye: Left conjunctiva is not injected. No chemosis.    Pupils: Pupils are equal, round, and reactive to light.  Cardiovascular:     Rate and Rhythm: Normal rate and regular rhythm.     Heart sounds: Normal heart sounds.  Pulmonary:     Effort: Pulmonary effort is normal. No tachypnea, accessory muscle usage or respiratory distress.     Breath sounds: Normal breath sounds. No wheezing, rhonchi or rales.  Chest:     Chest wall: No tenderness.  Abdominal:     Tenderness: There is no abdominal tenderness. There is no guarding or rebound.  Lymphadenopathy:     Head:     Right side of head: No submandibular, tonsillar or occipital adenopathy.  Left side of head: No submandibular, tonsillar or occipital adenopathy.     Cervical: No cervical adenopathy.  Skin:    Coloration: Skin is not pale.     Findings: No abrasion, erythema, petechiae or rash. Rash is not papular, urticarial or vesicular.  Neurological:     Mental Status: He is alert.  Psychiatric:        Behavior: Behavior is cooperative.      Diagnostic studies:    Spirometry: results normal (FEV1: 3.38/105%, FVC: 4.51/105%, FEV1/FVC: 75%).    Spirometry consistent with normal pattern.   Allergy Studies: none      Malachi Bonds, MD  [facility]

## 2022-12-28 ENCOUNTER — Other Ambulatory Visit: Payer: Self-pay | Admitting: Allergy & Immunology

## 2022-12-28 ENCOUNTER — Telehealth: Payer: Self-pay | Admitting: Allergy & Immunology

## 2022-12-28 MED ORDER — FLUTICASONE FUROATE-VILANTEROL 200-25 MCG/ACT IN AEPB: 1.0000 | INHALATION_SPRAY | Freq: Every day | RESPIRATORY_TRACT | 5 refills | Status: DC

## 2022-12-28 NOTE — Telephone Encounter (Signed)
Patient called back and I informed of medication

## 2022-12-28 NOTE — Telephone Encounter (Signed)
I called patient and informed per last AVS patient if breztri not costly effective we can submit for [place]. Patient asked to go back on breo. I informed I would send message to provider. Please advise if okay to switch back to breo. [facility] [address] in [place].

## 2022-12-28 NOTE — Telephone Encounter (Signed)
Patient's spouse called stating he needs Breo instead of Breztri. Patient's spouse states Virgel Bouquet is more cost effective for them she also states that he does not need the generic brand.

## 2022-12-28 NOTE — Telephone Encounter (Signed)
I called patient and left a message to call the office.

## 2022-12-28 NOTE — Telephone Encounter (Signed)
That is fine - thanks Byrd Hesselbach!   Malachi Bonds, MD [facility]

## 2022-12-31 NOTE — Telephone Encounter (Signed)
Allen [name] is not covered but dulera is,is the change ok

## 2023-01-23 ENCOUNTER — Other Ambulatory Visit: Payer: Self-pay | Admitting: "Endocrinology

## 2023-01-29 ENCOUNTER — Telehealth: Payer: Self-pay | Admitting: Allergy & Immunology

## 2023-01-29 MED ORDER — TRELEGY ELLIPTA 200-62.5-25 MCG/ACT IN AEPB: 1.0000 | INHALATION_SPRAY | Freq: Every day | RESPIRATORY_TRACT | 1 refills | Status: DC

## 2023-01-29 NOTE — Telephone Encounter (Signed)
 Forwarding message to provider for change in medication.

## 2023-01-29 NOTE — Telephone Encounter (Signed)
I sent it in.  Thanks

## 2023-01-29 NOTE — Telephone Encounter (Signed)
 Patient's spouse called stating she would like to have Trelegy sent into the pharmacy instead of Breo. Patient's spouse states that with his new insurance plan the Trelegy will be covered.

## 2023-01-29 NOTE — Telephone Encounter (Signed)
 Patient's spouse called stating he needs a refill of his [facility] [address] in [place]. Patient's spouse states he does his injections at home.

## 2023-01-29 NOTE — Telephone Encounter (Signed)
 L/m for patient wife to contact me to advise if he still getting Dupixent for [employer] Dupixent PAP

## 2023-01-30 NOTE — Telephone Encounter (Signed)
 I called the patient and he has been notified that the Trelegy inhaler has been sent in. He Also had a question about the Dupixent I did inform him that Tammy called and left a message yesterday. Patient plans to reach out to her later today.

## 2023-02-01 NOTE — Telephone Encounter (Signed)
 Patient's spouse called back stating he needs his Dupixent. Patient's spouse states they need [facility] to [facility] in [place].

## 2023-02-01 NOTE — Telephone Encounter (Signed)
 Spoke to [place] and let him know to contact Tammy after January 20th to find out about his Dupixent delivery. He stated he has enough to get him through the 20th.   [place] [phone]

## 2023-02-11 ENCOUNTER — Telehealth: Payer: Self-pay | Admitting: *Deleted

## 2023-02-11 MED ORDER — DUPIXENT 300 MG/2ML ~~LOC~~ SOSY: 300.0000 mg | PREFILLED_SYRINGE | SUBCUTANEOUS | 11 refills | Status: DC

## 2023-02-11 NOTE — Telephone Encounter (Signed)
Patient's spouse called back stating the patient only has one shot left and is needing the prescription written for his Dupixent.

## 2023-02-11 NOTE — Telephone Encounter (Signed)
Spoke to wife and Rx to [facility] for Dupixent they have signed up for [facility]

## 2023-02-11 NOTE — Telephone Encounter (Signed)
Spoke to patient wife she advised they had signed up for the [facility] for Dupixent since he was denied pPAP. Rx to [facility] for same

## 2023-02-17 ENCOUNTER — Other Ambulatory Visit: Payer: Self-pay | Admitting: Allergy & Immunology

## 2023-03-01 MED ORDER — DUPIXENT 300 MG/2ML ~~LOC~~ SOAJ: 300.0000 mg | SUBCUTANEOUS | 11 refills | Status: DC

## 2023-03-01 NOTE — Telephone Encounter (Signed)
 Patients pharmacy called to see if we can switch from syringe to pen. Per patient request.   Pharmacy# [phone]

## 2023-03-01 NOTE — Telephone Encounter (Signed)
Rx sent for pens.

## 2023-03-01 NOTE — Addendum Note (Signed)
 Addended by: Evangelina Hilt on: 03/01/2023 04:16 PM   Modules accepted: Orders

## 2023-04-25 ENCOUNTER — Telehealth: Payer: Self-pay | Admitting: "Endocrinology

## 2023-04-25 DIAGNOSIS — E782 Mixed hyperlipidemia: Secondary | ICD-10-CM

## 2023-04-25 DIAGNOSIS — E1165 Type 2 diabetes mellitus with hyperglycemia: Secondary | ICD-10-CM

## 2023-04-25 DIAGNOSIS — I10 Essential (primary) hypertension: Secondary | ICD-10-CM

## 2023-04-25 DIAGNOSIS — E119 Type 2 diabetes mellitus without complications: Secondary | ICD-10-CM

## 2023-04-25 NOTE — Telephone Encounter (Signed)
Lab orders updated and sent to [place].

## 2023-04-25 NOTE — Telephone Encounter (Signed)
 Labs need to be updated.

## 2023-06-05 ENCOUNTER — Ambulatory Visit: Payer: Medicare Other | Admitting: "Endocrinology

## 2023-06-28 ENCOUNTER — Ambulatory Visit: Payer: Medicare Other | Admitting: Allergy & Immunology

## 2023-07-08 ENCOUNTER — Telehealth: Payer: Self-pay | Admitting: "Endocrinology

## 2023-07-08 ENCOUNTER — Other Ambulatory Visit: Payer: Self-pay | Admitting: "Endocrinology

## 2023-07-08 DIAGNOSIS — E119 Type 2 diabetes mellitus without complications: Secondary | ICD-10-CM

## 2023-07-08 NOTE — Telephone Encounter (Signed)
 Can you update labs so I can mail to pt

## 2023-07-09 ENCOUNTER — Ambulatory Visit: Admitting: "Endocrinology

## 2023-07-25 LAB — COMPREHENSIVE METABOLIC PANEL WITH GFR
ALT: 14 IU/L (ref 0–44)
AST: 17 IU/L (ref 0–40)
Albumin: 4.3 g/dL (ref 3.8–4.8)
Alkaline Phosphatase: 67 IU/L (ref 44–121)
BUN/Creatinine Ratio: 17 (ref 10–24)
BUN: 54 mg/dL — ABNORMAL HIGH (ref 8–27)
Bilirubin Total: 0.6 mg/dL (ref 0.0–1.2)
CO2: 19 mmol/L — ABNORMAL LOW (ref 20–29)
Calcium: 8.6 mg/dL (ref 8.6–10.2)
Chloride: 107 mmol/L — ABNORMAL HIGH (ref 96–106)
Creatinine, Ser: 3.24 mg/dL — ABNORMAL HIGH (ref 0.76–1.27)
Globulin, Total: 2.1 g/dL (ref 1.5–4.5)
Glucose: 152 mg/dL — ABNORMAL HIGH (ref 70–99)
Potassium: 4.8 mmol/L (ref 3.5–5.2)
Sodium: 142 mmol/L (ref 134–144)
Total Protein: 6.4 g/dL (ref 6.0–8.5)
eGFR: 19 mL/min/{1.73_m2} — ABNORMAL LOW (ref >59)

## 2023-07-25 LAB — LIPID PANEL
Chol/HDL Ratio: 2.9 ratio (ref 0.0–5.0)
Cholesterol, Total: 158 mg/dL (ref 100–199)
HDL: 55 mg/dL (ref >39)
LDL Chol Calc (NIH): 87 mg/dL (ref 0–99)
Triglycerides: 85 mg/dL (ref 0–149)
VLDL Cholesterol Cal: 16 mg/dL (ref 5–40)

## 2023-07-25 LAB — TSH: TSH: 1.95 u[IU]/mL (ref 0.450–4.500)

## 2023-07-25 LAB — T4, FREE: Free T4: 0.83 ng/dL (ref 0.82–1.77)

## 2023-08-01 ENCOUNTER — Other Ambulatory Visit: Payer: Self-pay | Admitting: Allergy & Immunology

## 2023-08-14 ENCOUNTER — Ambulatory Visit (INDEPENDENT_AMBULATORY_CARE_PROVIDER_SITE_OTHER): Admitting: "Endocrinology

## 2023-08-14 ENCOUNTER — Encounter: Payer: Self-pay | Admitting: "Endocrinology

## 2023-08-14 VITALS — BP 138/84 | HR 64 | Ht 71.0 in | Wt 170.0 lb

## 2023-08-14 DIAGNOSIS — E782 Mixed hyperlipidemia: Secondary | ICD-10-CM | POA: Diagnosis not present

## 2023-08-14 DIAGNOSIS — I10 Essential (primary) hypertension: Secondary | ICD-10-CM

## 2023-08-14 DIAGNOSIS — N184 Chronic kidney disease, stage 4 (severe): Secondary | ICD-10-CM | POA: Diagnosis not present

## 2023-08-14 DIAGNOSIS — E1122 Type 2 diabetes mellitus with diabetic chronic kidney disease: Secondary | ICD-10-CM

## 2023-08-14 DIAGNOSIS — E119 Type 2 diabetes mellitus without complications: Secondary | ICD-10-CM

## 2023-08-14 LAB — POCT GLYCOSYLATED HEMOGLOBIN (HGB A1C): HbA1c, POC (controlled diabetic range): 6.3 % (ref 0.0–7.0)

## 2023-08-14 NOTE — Progress Notes (Signed)
 08/14/2023   Endocrinology follow-up note   Subjective:    Patient ID: Allen [name], male    DOB: Jun 01, 1950. Patient is being seen in follow-up for the management of type 2 diabetes.    Vasireddy, Edie Bi, MD  Past Medical History:  Diagnosis Date   Anemia    on iron since gastric bypass   Arthritis    mild   Asthma    Chronic kidney disease    stage 3, now resolved since weight loss   Claustrophobia    Diabetes mellitus, type II (HCC)    type 2 metformin    GERD (gastroesophageal reflux disease)    takes nexium   Hyperlipidemia    Hypertension    Pneumonia 15 yrs ago   SCC (squamous cell carcinoma) 12/29/2007   Left Mid Preauricular(Mod.Diff) (Dr. Bluford)   SCC (squamous cell carcinoma) 12/29/2007   Right Upper Dorsal Nose(Well Diff) (Dr. Bluford)   SCC (squamous cell carcinoma) 12/29/2007   Anti-Tragus Right Ear(Mod.Diff) (Dr. Bluford)   SCC (squamous cell carcinoma) 06/06/2010   Left Nose(in Situ) (curet and 5FU)   SCC (squamous cell carcinoma) 06/06/2010   Right Check(Well Diff) (curet and 5FU)   SCC (squamous cell carcinoma) 07/20/2013   Right Sideburn(KA) (curet and 5FU)   SCC (squamous cell carcinoma) 06/27/2015   Right Neck Sup(in Situ) (curet and 5FU)   SCC (squamous cell carcinoma) 06/27/2015   Right Neck Inf(in Situ) (curet and 5FU)   SCC (squamous cell carcinoma) 01/02/2017   Left Wrist(in Situ) (curet and 5FU)   SCC (squamous cell carcinoma) 01/02/2017   Right Outer Cheek(in Situ) (curet and 5FU)   SCC (squamous cell carcinoma) 06/23/2018   Left Sideburn(in Situ) (curet and 5FU)   SCC (squamous cell carcinoma) 06/23/2018   Left Upper Lip(Well Diff) (MOH's)   SCC (squamous cell carcinoma) 06/23/2018   Right Ear Rim(in Situ) (curet and 5FU)   Sleep apnea    cpap, no cpap since may 2018 weight loss 80 labs    Superficial nodular basal cell carcinoma (BCC) 06/27/2015   Left Rim of Ear (curet and 5FU)   Past Surgical History:  Procedure  Laterality Date   ANTERIOR CERVICAL DECOMP/DISCECTOMY FUSION Right 10/10/2018   Procedure: Right sided revision with hardware removal at Cervical three-four, Cervical four-five Anterior cervical decompression/discectomy/fusion with corpectomy at Cervical five;  Surgeon: Unice Pac, MD;  Location: [facility] OR;  Service: Neurosurgery;  Laterality: Right;   CARDIAC CATHETERIZATION     patient reports he thinks he had one over 25 years ago   carpal tunnel Bilateral    CERVICAL VERTEBRAE EXCISION     and cleaning   CHOLECYSTECTOMY     cyst removed left hand  01-28-17   ESOPHAGOGASTRODUODENOSCOPY (EGD) WITH PROPOFOL  N/A 01/31/2017   Procedure: ESOPHAGOGASTRODUODENOSCOPY (EGD) WITH PROPOFOL ;  Surgeon: Ethyl Lenis, MD;  Location: [facility];  Service: General;  Laterality: N/A;   EYE SURGERY     bilateral cataract removal   GASTRIC ROUX-EN-Y N/A 06/04/2016   Procedure: LAPAROSCOPIC ROUX-EN-Y GASTRIC BYPASS WITH UPPER ENDOSCOPY;  Surgeon: Signe Mitzie LABOR, MD;  Location: [facility];  Service: General;  Laterality: N/A;   LAPAROSCOPY N/A 07/10/2022   Procedure: LAPAROSCOPY DIAGNOSTIC; REDUCTION AND REPAIR OF INTERNAL HERNIA;  Surgeon: Signe Mitzie LABOR, MD;  Location: [facility];  Service: General;  Laterality: N/A;   LAPAROTOMY N/A 07/10/2022   Procedure: EXPLORATORY LAPAROTOMY;  Surgeon: Signe Mitzie LABOR, MD;  Location: [facility];  Service: General;  Laterality: N/A;   NASAL SINUS  SURGERY     POSTERIOR CERVICAL FUSION/FORAMINOTOMY N/A 06/08/2019   Procedure: Cervical Three to Cervical Seven Posterior cervical decompression and fusion;  Surgeon: Unice Pac, MD;  Location: [facility];  Service: Neurosurgery;  Laterality: N/A;  Cervical Three to Cervical Seven Posterior cervical decompression and fusion   RADIOLOGY WITH ANESTHESIA N/A 04/28/2019   Procedure: MRI RADIOLOGY WITH ANESTHESIA;  Surgeon: Radiologist, Medication, MD;  Location: [facility] OR;  Service: Radiology;  Laterality: N/A;   TRIGGER FINGER RELEASE     Social  History   Socioeconomic History   Marital status: Married    Spouse name: Not on file   Number of children: Not on file   Years of education: Not on file   Highest education level: Not on file  Occupational History   Not on file  Tobacco Use   Smoking status: Never    Passive exposure: Never   Smokeless tobacco: Never  Vaping Use   Vaping status: Never Used  Substance and Sexual Activity   Alcohol use: No   Drug use: No   Sexual activity: Yes  Other Topics Concern   Not on file  Social History Narrative   Not on file   Social Drivers of Health   Financial Resource Strain: Not on file  Food Insecurity: Unknown (07/10/2022)   Hunger Vital Sign    Worried About Running Out of Food in the Last Year: Never true    Ran Out of Food in the Last Year: Patient declined  Transportation Needs: No Transportation Needs (07/10/2022)   PRAPARE - Administrator, Civil Service (Medical): No    Lack of Transportation (Non-Medical): No  Physical Activity: Not on file  Stress: Not on file  Social Connections: Not on file   Outpatient Encounter Medications as of 08/14/2023  Medication Sig   albuterol  (PROAIR  HFA) 108 (90 Base) MCG/ACT inhaler 2 puffs every 4 hours as needed only  if your can't catch your breath (Patient taking differently: Inhale 1 puff into the lungs every 6 (six) hours as needed for shortness of breath.)   amLODipine  (NORVASC ) 10 MG tablet Take 10 mg by mouth daily.   BREO ELLIPTA  200-25 MCG/ACT AEPB TAKE 1 PUFF BY MOUTH EVERY DAY   Calcium  Carb-Cholecalciferol  (CALCIUM  500+D3 PO) Take 1 tablet by mouth 3 (three) times daily.    Cyanocobalamin  (VITAMIN B12) 1000 MCG TBCR Take 1 tablet by mouth daily.   Dupilumab  (DUPIXENT ) 300 MG/2ML SOAJ Inject 300 mg into the skin every 14 (fourteen) days.   escitalopram  (LEXAPRO ) 20 MG tablet Take 20 mg by mouth daily.   famotidine  (PEPCID ) 20 MG tablet One after supper   hydrochlorothiazide  (HYDRODIURIL ) 12.5 MG tablet Take  12.5 mg by mouth daily.   MAGNESIUM  CITRATE PO Take 1 tablet by mouth daily. Chew Gummies: 200 mg   Multiple Vitamin (MULTIVITAMIN WITH MINERALS) TABS tablet Take 1 tablet by mouth daily.   olmesartan  (BENICAR ) 40 MG tablet Take 40 mg by mouth daily.   pantoprazole  (PROTONIX ) 40 MG tablet Take 1 tablet (40 mg total) by mouth daily. Take 30-60 min before first meal of the day   TRELEGY ELLIPTA  200-62.5-25 MCG/ACT AEPB TAKE 1 PUFF BY MOUTH EVERY DAY   [DISCONTINUED] tirzepatide  (MOUNJARO ) 2.5 MG/0.5ML Pen Inject 2.5 mg into the skin once a week.   Facility-Administered Encounter Medications as of 08/14/2023  Medication   dupilumab  (DUPIXENT ) prefilled syringe 300 mg   ALLERGIES: Allergies  Allergen Reactions   Gramineae Pollens Cough  Levofloxacin Nausea And Vomiting   Baclofen Anxiety and Other (See Comments)    Alters mental state   VACCINATION STATUS: Immunization History  Administered Date(s) Administered   Influenza, High Dose Seasonal PF 10/19/2017, 10/29/2018   Influenza-Unspecified 10/22/2017   Moderna Covid-19 Fall Seasonal Vaccine 101yrs & older 11/14/2021   Moderna Covid-19 Vaccine Bivalent Booster 23yrs & up 09/28/2020   Moderna Sars-Covid-2 Vaccination 02/14/2019, 09/16/2019, 05/02/2020   Pfizer(Comirnaty)Fall Seasonal Vaccine 12 years and older 10/11/2022   Tdap 12/23/2017   Zoster Recombinant(Shingrix) 12/23/2017    Diabetes He presents for his follow-up diabetic visit. He has type 2 diabetes mellitus. Onset time: He was diagnosed at approximate age of 45 years. His disease course has been stable. There are no hypoglycemic associated symptoms. Pertinent negatives for hypoglycemia include no confusion, headaches, pallor or seizures. Pertinent negatives for diabetes include no chest pain, no polydipsia, no polyphagia, no polyuria and no weakness. There are no hypoglycemic complications. Symptoms are stable. Diabetic complications include nephropathy. Risk factors for  coronary artery disease include diabetes mellitus, dyslipidemia, hypertension and male sex. His weight is decreasing steadily (He is status post Roux-en-Y gastric bypass-after successfully losing 135 pounds.). He is following a diabetic and generally healthy diet. He has not had a previous visit with a dietitian. He participates in exercise intermittently (He plays golf.). There is no change in his home blood glucose trend. Allen [name] presents with A1c of 6.3%.  He has not taken any medications for 6 weeks, Mounjaro  was suppressing his appetite too much.  He was previously treated with low-dose Trulicity .  He presents with no symptoms of hyper or hypoglycemia. He maintained 135 pounds weight loss after his surgery.  He is status post Roux-en-Y surgery.  ) An ACE inhibitor/angiotensin II receptor blocker is being taken. Eye exam is current.  Hyperlipidemia This is a chronic problem. The current episode started more than 1 year ago. The problem is uncontrolled. Exacerbating diseases include chronic renal disease and diabetes. He has no history of obesity. Pertinent negatives include no chest pain, myalgias or shortness of breath. Current antihyperlipidemic treatment includes statins and bile acid squestrants. Compliance problems include adherence to diet.  Risk factors for coronary artery disease include diabetes mellitus, dyslipidemia, hypertension, male sex, obesity and a sedentary lifestyle.  Hypertension This is a chronic problem. The current episode started more than 1 year ago. The problem is uncontrolled. Pertinent negatives include no chest pain, headaches, neck pain, palpitations or shortness of breath. Risk factors for coronary artery disease include diabetes mellitus, dyslipidemia and sedentary lifestyle. Past treatments include angiotensin blockers. Hypertensive end-organ damage includes kidney disease. Identifiable causes of hypertension include chronic renal disease.   Interval history: In June  2021, he developed abdominal pain which subsequently showed internal hernia for which he was seen and treated at [facility]: He underwent  diagnostic laparoscopy, exploratory laparotomy with reduction of small bowel volvulus and repair of incarcerated internal hernia at the jejunojejunostomy mesenteric defect containing essentially the entire common channel and part of the Roux limb/ JJ anastomosis  - Dr. Signe 6/18  He has recovered from the surgery very well.  Objective:    BP 138/84   Pulse 64   Ht 5' 11 (1.803 m)   Wt 170 lb (77.1 kg)   BMI 23.71 kg/m   Wt Readings from Last 3 Encounters:  08/14/23 170 lb (77.1 kg)  12/26/22 175 lb (79.4 kg)  12/13/22 170 lb 12.8 oz (77.5 kg)     Recent Results (from  the past 2160 hours)  Lipid panel     Status: None   Collection Time: 07/24/23  9:39 AM  Result Value Ref Range   Cholesterol, Total 158 100 - 199 mg/dL   Triglycerides 85 0 - 149 mg/dL   HDL 55 >60 mg/dL   VLDL Cholesterol Cal 16 5 - 40 mg/dL   LDL Chol Calc (NIH) 87 0 - 99 mg/dL   Chol/HDL Ratio 2.9 0.0 - 5.0 ratio    Comment:                                   T. Chol/HDL Ratio                                             Men  Women                               1/2 Avg.Risk  3.4    3.3                                   Avg.Risk  5.0    4.4                                2X Avg.Risk  9.6    7.1                                3X Avg.Risk 23.4   11.0   Comprehensive metabolic panel with GFR     Status: Abnormal   Collection Time: 07/24/23  9:39 AM  Result Value Ref Range   Glucose 152 (H) 70 - 99 mg/dL   BUN 54 (H) 8 - 27 mg/dL   Creatinine, Ser 6.75 (H) 0.76 - 1.27 mg/dL   eGFR 19 (L) >40 fO/fpw/8.26   BUN/Creatinine Ratio 17 10 - 24   Sodium 142 134 - 144 mmol/L   Potassium 4.8 3.5 - 5.2 mmol/L   Chloride 107 (H) 96 - 106 mmol/L   CO2 19 (L) 20 - 29 mmol/L   Calcium  8.6 8.6 - 10.2 mg/dL   Total Protein 6.4 6.0 - 8.5 g/dL   Albumin 4.3 3.8 - 4.8 g/dL    Globulin, Total 2.1 1.5 - 4.5 g/dL   Bilirubin Total 0.6 0.0 - 1.2 mg/dL   Alkaline Phosphatase 67 44 - 121 IU/L   AST 17 0 - 40 IU/L   ALT 14 0 - 44 IU/L  TSH     Status: None   Collection Time: 07/24/23  9:39 AM  Result Value Ref Range   TSH 1.950 0.450 - 4.500 uIU/mL  T4, free     Status: None   Collection Time: 07/24/23  9:39 AM  Result Value Ref Range   Free T4 0.83 0.82 - 1.77 ng/dL  HgB J8r     Status: None   Collection Time: 08/14/23 10:10 AM  Result Value Ref Range   Hemoglobin A1C     HbA1c POC (<> result, manual entry)     HbA1c, POC (prediabetic range)  HbA1c, POC (controlled diabetic range) 6.3 0.0 - 7.0 %   Lipid Panel     Component Value Date/Time   CHOL 158 07/24/2023 0939   TRIG 85 07/24/2023 0939   HDL 55 07/24/2023 0939   CHOLHDL 2.9 07/24/2023 0939   CHOLHDL 3.9 04/25/2016 1119   VLDL 27 04/25/2016 1119   LDLCALC 87 07/24/2023 0939     Assessment & Plan:   1.  type 2 diabetes mellitus complicated by stage 4 CKD - Patient has currently controlled asymptomatic type 2 DM since  73 years of age.  Patient is status post Roux-en-Y gastric bypass.  Allen [name] presents with A1c of 6.3%.  He has not taken any medications for 6 weeks, Mounjaro  was suppressing his appetite too much.  He was previously treated with low-dose Trulicity .  He presents with no symptoms of hyper or hypoglycemia. He maintained 135 pounds weight loss after his surgery.  He is status post Roux-en-Y surgery. He would not need additional intervention for diabetes at this time.  Hence, Mounjaro  was discontinued for him. - In the interest of avoiding excessive suppression of his appetite, shared medical decision was made to continue off of medication until next measurement of his A1c in 4 months.  He will call me if he develops hypoglycemia or hyperglycemia in the interval.   He is status post exploratory surgery to repair an internal hernia which was incarcerated and causing acute abdomen in  June 2024.   - His diabetes is complicated by stage 4 CKD  and patient remains at a high risk for more acute and chronic complications of diabetes which include CAD, CVA, ESRD, retinopathy, and neuropathy. These are all discussed in detail with the patient.  - I have counseled the patient on diet management by adopting a carbohydrate restricted/protein rich diet.  - he acknowledges that there is a room for improvement in his food and drink choices. - Suggestion is made for him to avoid simple carbohydrates  from his diet including Cakes, Sweet Desserts, Ice Cream, Soda (diet and regular), Sweet Tea, Candies, Chips, Cookies, Store Bought Juices, Alcohol , Artificial Sweeteners,  Coffee Creamer, and Sugar-free Products, Lemonade. This will help patient to have more stable blood glucose profile and potentially avoid unintended weight gain.  - Patient specific target  A1c;  LDL, HDL, Triglycerides,  were discussed in detail.  2) BP/HTN: - His blood pressure is controlled to target.  He remains on amlodipine  10 mg p.o. daily, hydrochlorothiazide  12.5 mg p.o. daily, Benicar  40 mg p.o. daily.  He wishes to address his blood pressure management with his nephrologist.    3) Lipids/HPL: Recent lipid panel showed LDL at 87 improving 123.  He is not on statins, would like to address it with his PMD.     4)  Weight/Diet: Status post Roux-en-Y gastric bypass, successfully lost up to 135 pounds.   His current weight is optimal for him.  He would like to maintain with no further suppression of his appetite and this was deemed appropriate and reasonable.  5) Chronic Care/Health Maintenance:  -Patient is on ACEI/ARB and Statin medications and encouraged to continue to follow up with Ophthalmology, nephrology, podiatrist at least yearly or according to recommendations, and advised to   stay away from smoking. I have recommended yearly flu vaccine and pneumonia vaccination at least every 5 years; moderate  intensity exercise for up to 150 minutes weekly; and  sleep for at least 7 hours a day. He is advised to continue  vitamin B12, calcium  and vitamin D  supplements.  -This patient will benefit from screening bone density.  I discussed and ordered this test for him to obtain at density imaging facility.   His screening ABI was negative for PAD in March 2022.  This study will be repeated in March 2027, or sooner if needed.  - I advised patient to maintain close follow up with Vasireddy, Edie Bi, MD for primary care needs.    I spent  25  minutes in the care of the patient today including review of labs from CMP, Lipids, Thyroid  Function, Hematology (current and previous including abstractions from other facilities); face-to-face time discussing  his blood glucose readings/logs, discussing hypoglycemia and hyperglycemia episodes and symptoms, medications doses, his options of short and long term treatment based on the latest standards of care / guidelines;  discussion about incorporating lifestyle medicine;  and documenting the encounter. Risk reduction counseling performed per USPSTF guidelines to reduce  cardiovascular risk factors.     Please refer to Patient Instructions for Blood Glucose Monitoring and Insulin /Medications Dosing Guide  in media tab for additional information. Please  also refer to  Patient Self Inventory in the Media  tab for reviewed elements of pertinent patient history.  Allen [name] participated in the discussions, expressed understanding, and voiced agreement with the above plans.  All questions were answered to his satisfaction. he is encouraged to contact clinic should he have any questions or concerns prior to his return visit.     Follow up plan: In 6 months with labs. Allen Earl, MD Phone: [phone]  Fax: [phone]  -  This note was partially dictated with voice recognition software. Similar sounding words can be transcribed inadequately or  may not  be corrected upon review.  08/14/2023, 10:47 AM

## 2023-09-06 ENCOUNTER — Encounter: Payer: Self-pay | Admitting: Allergy & Immunology

## 2023-09-06 ENCOUNTER — Ambulatory Visit (INDEPENDENT_AMBULATORY_CARE_PROVIDER_SITE_OTHER): Admitting: Allergy & Immunology

## 2023-09-06 VITALS — BP 170/80 | HR 70 | Temp 98.0°F | Resp 18 | Ht 69.0 in | Wt 170.2 lb

## 2023-09-06 DIAGNOSIS — J302 Other seasonal allergic rhinitis: Secondary | ICD-10-CM | POA: Diagnosis not present

## 2023-09-06 DIAGNOSIS — D806 Antibody deficiency with near-normal immunoglobulins or with hyperimmunoglobulinemia: Secondary | ICD-10-CM

## 2023-09-06 DIAGNOSIS — J3089 Other allergic rhinitis: Secondary | ICD-10-CM

## 2023-09-06 DIAGNOSIS — J4550 Severe persistent asthma, uncomplicated: Secondary | ICD-10-CM | POA: Diagnosis not present

## 2023-09-06 MED ORDER — TRELEGY ELLIPTA 200-62.5-25 MCG/ACT IN AEPB: 1.0000 | INHALATION_SPRAY | Freq: Every day | RESPIRATORY_TRACT | 1 refills | Status: AC

## 2023-09-06 NOTE — Progress Notes (Signed)
 FOLLOW UP  Date of Service/Encounter:  09/06/23   Assessment:   Moderate persistent asthma, uncomplicated - with improvement on Trelegy + Dupixent   Recurrent infections - with inadequate protection against Streptococcus pneumonia despite vaccination (doing well on Dupixent every two weeks)   Chronic rhinitis - previously on allergen immunotherapy through someone in [place] in the early 1990s    S/p gastric bypass in 2018 and lost 100+ pounds (and has kept it off)   Type 2 diabetes mellitus - controlled with Trulicity once weekly (sees Dr. Lenis)    GERD - on Protonix   Never smoker  Plan/Recommendations:   1. Moderate persistent asthma, uncomplicated - Lung testing looks great today. - Daily controller medication(s): Trelegy one puff once daily  + Dupixent every two weeks - Prior to physical activity: albuterol 2 puffs 10-15 minutes before physical activity. - Rescue medications: albuterol 4 puffs every 4-6 hours as needed - Asthma control goals:  * Full participation in all desired activities (may need albuterol before activity) * Albuterol use two time or less a week on average (not counting use with activity) * Cough interfering with sleep two time or less a month * Oral steroids no more than once a year * No hospitalizations  2. Recurrent infections - The Dupixent has helped decrease infections. - This is working so well.   3. Chronic rhinitis (grasses, cat, dust mites, cockroach) - We can always pursue allergy shots in the future if needed.   4. Return in about 6 months (around 03/08/2024). You can have the follow up appointment with Dr. Iva or a Nurse Practicioner (our Nurse Practitioners are excellent and always have Physician oversight!).    Subjective:   Allen [name] is a 73 y.o. male presenting today for follow up of  Chief Complaint  Patient presents with   Follow-up    Dupixent has kept him out of the ENT.     Allen [name]  has a history of the following: Patient Active Problem List   Diagnosis Date Noted   Osteoporosis screening 12/13/2022   Hypocalcemia 12/13/2022   Hx of gastric bypass 07/10/2022   Chronic asthma, moderate persistent, uncomplicated 11/11/2019   Cervical pseudoarthrosis (HCC) 06/08/2019   Vitamin D deficiency 05/19/2019   Herniated cervical disc without myelopathy 10/10/2018   Uncontrolled type 2 diabetes mellitus with hyperglycemia (HCC) 04/22/2015   Mixed hyperlipidemia 04/22/2015   Essential hypertension, benign 04/22/2015   Obesity 04/22/2015    History obtained from: chart review and patient.  Discussed the use of AI scribe software for clinical note transcription with the patient and/or guardian, who gave verbal consent to proceed.  Allen [name] is a 73 y.o. male presenting for a follow up visit.  He was last seen in December 2024.  At that time, we did not like testing.  We started Breztri in place of Trelegy due to better coverage.  We continue with Dupixent every 2 weeks which she was getting at home.  There is recurrent infections, these had decreased with the Dupixent.  For his rhinitis, he was doing fine with allergy immunotherapy.  Since last visit, he has done remarkably well.  In June of the previous year, he experienced excruciating abdominal pain, leading to a diagnosis of an internal hernia with a hole leaking into his small intestines. Initially misdiagnosed with narrowing of the blood vessels in the small intestines, he was advised to take prednisone and antibiotics. However, further evaluation at a different facility led to the  correct diagnosis and subsequent surgery.  Asthma/Respiratory Symptom History: He reports significant improvement in breathing since starting Dupixent, noting no sinus infections for at least a year. He describes his breathing as 'really good' and reports that he used to visit the ENT frequently. He remains on the Trelegy one puff once daily.   He is  on a medication plan costing $2,000 annually, including Dupixent, which he finds beneficial for managing costs. He takes Trelegy once daily in the morning without experiencing hoarseness. He previously used Breztri but switched to Trelegy due to coverage issues.  Allergic Rhinitis Symptom History: Allergic rhinitis symptoms are well controlled. He does not use anything on a routine basis.   Infection Symptom History: Reports significant improvement in sinus infections since starting Dupixent. Last sinus infection was over one year ago. He now has to go out of his way to see his ENT doctor since he used to see him every other week for a sinus infection. The Dupixent fixed all of this.   He did injure his right knee and tore some ligaments. This was when he was at the beach. He was on his way to go fishing with his son and grandsons. Unfortunately, he never made it to the boat. But they caught a lot of mackerel apparently.   Otherwise, there have been no changes to his past medical history, surgical history, family history, or social history.    Review of systems otherwise negative other than that mentioned in the HPI.    Objective:   Blood pressure (!) 170/80, pulse 70, temperature 98 F (36.7 C), resp. rate 18, height 5' 9 (1.753 m), weight 170 lb 4 oz (77.2 kg), SpO2 98%. Body mass index is 25.14 kg/m.    Physical Exam Vitals reviewed.  Constitutional:      Appearance: He is well-developed.     Comments: Very lovely. Talkative.   HENT:     Head: Normocephalic and atraumatic.     Right Ear: Tympanic membrane, ear canal and external ear normal. No drainage, swelling or tenderness. Tympanic membrane is not injected, scarred, erythematous, retracted or bulging.     Left Ear: Tympanic membrane, ear canal and external ear normal. No drainage, swelling or tenderness. Tympanic membrane is not injected, scarred, erythematous, retracted or bulging.     Nose: No nasal deformity, septal  deviation, mucosal edema or rhinorrhea.     Right Turbinates: Enlarged and swollen.     Left Turbinates: Enlarged and swollen.     Right Sinus: No maxillary sinus tenderness or frontal sinus tenderness.     Left Sinus: No maxillary sinus tenderness or frontal sinus tenderness.     Mouth/Throat:     Mouth: Mucous membranes are not pale and not dry.     Pharynx: Uvula midline.  Eyes:     General:        Right eye: No discharge.        Left eye: No discharge.     Conjunctiva/sclera: Conjunctivae normal.     Right eye: Right conjunctiva is not injected. No chemosis.    Left eye: Left conjunctiva is not injected. No chemosis.    Pupils: Pupils are equal, round, and reactive to light.  Cardiovascular:     Rate and Rhythm: Normal rate and regular rhythm.     Heart sounds: Normal heart sounds.  Pulmonary:     Effort: Pulmonary effort is normal. No tachypnea, accessory muscle usage or respiratory distress.     Breath sounds: Normal breath  sounds. No wheezing, rhonchi or rales.  Chest:     Chest wall: No tenderness.  Abdominal:     Tenderness: There is no abdominal tenderness. There is no guarding or rebound.  Lymphadenopathy:     Head:     Right side of head: No submandibular, tonsillar or occipital adenopathy.     Left side of head: No submandibular, tonsillar or occipital adenopathy.     Cervical: No cervical adenopathy.  Skin:    Coloration: Skin is not pale.     Findings: No abrasion, erythema, petechiae or rash. Rash is not papular, urticarial or vesicular.  Neurological:     Mental Status: He is alert.  Psychiatric:        Behavior: Behavior is cooperative.      Diagnostic studies:    Spirometry: results normal (FEV1: 2.96/99%, FVC: 3.90/98%, FEV1/FVC: 76%).    Spirometry consistent with normal pattern.   Allergy Studies: none       Marty Shaggy, MD  [facility] 

## 2023-09-06 NOTE — Patient Instructions (Addendum)
 1. Moderate persistent asthma, uncomplicated - Lung testing looks great today. - Daily controller medication(s): Trelegy one puff once daily  + Dupixent every two weeks - Prior to physical activity: albuterol 2 puffs 10-15 minutes before physical activity. - Rescue medications: albuterol 4 puffs every 4-6 hours as needed - Asthma control goals:  * Full participation in all desired activities (may need albuterol before activity) * Albuterol use two time or less a week on average (not counting use with activity) * Cough interfering with sleep two time or less a month * Oral steroids no more than once a year * No hospitalizations  2. Recurrent infections - The Dupixent has helped decrease infections. - This is working so well.   3. Chronic rhinitis (grasses, cat, dust mites, cockroach) - We can always pursue allergy shots in the future if needed.   4. Return in about 6 months (around 03/08/2024). You can have the follow up appointment with Dr. Iva or a Nurse Practicioner (our Nurse Practitioners are excellent and always have Physician oversight!).    Please inform us  of any Emergency Department visits, hospitalizations, or changes in symptoms. Call us  before going to the ED for breathing or allergy symptoms since we might be able to fit you in for a sick visit. Feel free to contact us  anytime with any questions, problems, or concerns.  It was a pleasure to see you and your family again today!  Websites that have reliable patient information: 1. American Academy of Asthma, Allergy, and Immunology: www.aaaai.org 2. Food Allergy Research and Education (FARE): foodallergy.org 3. Mothers of Asthmatics: http://www.asthmacommunitynetwork.org 4. American College of Allergy, Asthma, and Immunology: www.acaai.org      "Like" us  on Facebook and Instagram for our latest updates!      A healthy democracy works best when [employer] participate! Make sure you are registered to vote! If you  have moved or changed any of your contact information, you will need to get this updated before voting! Scan the QR codes below to learn more!

## 2023-09-12 ENCOUNTER — Other Ambulatory Visit: Payer: Self-pay | Admitting: Surgery

## 2023-09-12 DIAGNOSIS — R112 Nausea with vomiting, unspecified: Secondary | ICD-10-CM

## 2023-09-20 ENCOUNTER — Other Ambulatory Visit

## 2023-10-15 ENCOUNTER — Other Ambulatory Visit: Payer: Self-pay | Admitting: Surgery

## 2023-10-15 ENCOUNTER — Ambulatory Visit
Admission: RE | Admit: 2023-10-15 | Discharge: 2023-10-15 | Disposition: A | Discharge status: Home / Self Care | Source: Ambulatory Visit | Attending: Surgery | Admitting: Surgery

## 2023-10-15 DIAGNOSIS — R112 Nausea with vomiting, unspecified: Secondary | ICD-10-CM

## 2023-12-17 ENCOUNTER — Encounter: Payer: Self-pay | Admitting: "Endocrinology

## 2023-12-17 ENCOUNTER — Ambulatory Visit: Admitting: "Endocrinology

## 2023-12-17 VITALS — BP 166/86 | HR 64 | Ht 69.0 in | Wt 174.8 lb

## 2023-12-17 DIAGNOSIS — Z7985 Long-term (current) use of injectable non-insulin antidiabetic drugs: Secondary | ICD-10-CM | POA: Diagnosis not present

## 2023-12-17 DIAGNOSIS — E782 Mixed hyperlipidemia: Secondary | ICD-10-CM

## 2023-12-17 DIAGNOSIS — E119 Type 2 diabetes mellitus without complications: Secondary | ICD-10-CM | POA: Diagnosis not present

## 2023-12-17 DIAGNOSIS — I10 Essential (primary) hypertension: Secondary | ICD-10-CM

## 2023-12-17 LAB — POCT GLYCOSYLATED HEMOGLOBIN (HGB A1C): HbA1c, POC (controlled diabetic range): 7.7 % — AB (ref 0.0–7.0)

## 2023-12-17 MED ORDER — TRULICITY 0.75 MG/0.5ML ~~LOC~~ SOAJ: 0.7500 mg | SUBCUTANEOUS | 0 refills | Status: AC

## 2023-12-17 NOTE — Progress Notes (Signed)
 12/17/2023   Endocrinology follow-up note   Subjective:    Patient ID: Allen [name], male    DOB: 1950/10/01. Patient is being seen in follow-up for the management of type 2 diabetes.    Vasireddy, Edie Bi, MD  Past Medical History:  Diagnosis Date   Anemia    on iron since gastric bypass   Arthritis    mild   Asthma    Chronic kidney disease    stage 3, now resolved since weight loss   Claustrophobia    Diabetes mellitus, type II (HCC)    type 2 metformin    GERD (gastroesophageal reflux disease)    takes nexium   Hyperlipidemia    Hypertension    Pneumonia 15 yrs ago   SCC (squamous cell carcinoma) 12/29/2007   Left Mid Preauricular(Mod.Diff) (Dr. Bluford)   SCC (squamous cell carcinoma) 12/29/2007   Right Upper Dorsal Nose(Well Diff) (Dr. Bluford)   SCC (squamous cell carcinoma) 12/29/2007   Anti-Tragus Right Ear(Mod.Diff) (Dr. Bluford)   SCC (squamous cell carcinoma) 06/06/2010   Left Nose(in Situ) (curet and 5FU)   SCC (squamous cell carcinoma) 06/06/2010   Right Check(Well Diff) (curet and 5FU)   SCC (squamous cell carcinoma) 07/20/2013   Right Sideburn(KA) (curet and 5FU)   SCC (squamous cell carcinoma) 06/27/2015   Right Neck Sup(in Situ) (curet and 5FU)   SCC (squamous cell carcinoma) 06/27/2015   Right Neck Inf(in Situ) (curet and 5FU)   SCC (squamous cell carcinoma) 01/02/2017   Left Wrist(in Situ) (curet and 5FU)   SCC (squamous cell carcinoma) 01/02/2017   Right Outer Cheek(in Situ) (curet and 5FU)   SCC (squamous cell carcinoma) 06/23/2018   Left Sideburn(in Situ) (curet and 5FU)   SCC (squamous cell carcinoma) 06/23/2018   Left Upper Lip(Well Diff) (MOH's)   SCC (squamous cell carcinoma) 06/23/2018   Right Ear Rim(in Situ) (curet and 5FU)   Sleep apnea    cpap, no cpap since may 2018 weight loss 80 labs    Superficial nodular basal cell carcinoma (BCC) 06/27/2015   Left Rim of Ear (curet and 5FU)   Past Surgical History:  Procedure  Laterality Date   ANTERIOR CERVICAL DECOMP/DISCECTOMY FUSION Right 10/10/2018   Procedure: Right sided revision with hardware removal at Cervical three-four, Cervical four-five Anterior cervical decompression/discectomy/fusion with corpectomy at Cervical five;  Surgeon: Unice Pac, MD;  Location: [facility] OR;  Service: Neurosurgery;  Laterality: Right;   CARDIAC CATHETERIZATION     patient reports he thinks he had one over 25 years ago   carpal tunnel Bilateral    CERVICAL VERTEBRAE EXCISION     and cleaning   CHOLECYSTECTOMY     cyst removed left hand  01/28/2017   ESOPHAGOGASTRODUODENOSCOPY (EGD) WITH PROPOFOL  N/A 01/31/2017   Procedure: ESOPHAGOGASTRODUODENOSCOPY (EGD) WITH PROPOFOL ;  Surgeon: Ethyl Lenis, MD;  Location: [facility];  Service: General;  Laterality: N/A;   EYE SURGERY     bilateral cataract removal   GASTRIC ROUX-EN-Y N/A 06/04/2016   Procedure: LAPAROSCOPIC ROUX-EN-Y GASTRIC BYPASS WITH UPPER ENDOSCOPY;  Surgeon: Signe Mitzie LABOR, MD;  Location: [facility];  Service: General;  Laterality: N/A;   LAPAROSCOPY N/A 07/10/2022   Procedure: LAPAROSCOPY DIAGNOSTIC; REDUCTION AND REPAIR OF INTERNAL HERNIA;  Surgeon: Signe Mitzie LABOR, MD;  Location: [facility];  Service: General;  Laterality: N/A;   LAPAROTOMY N/A 07/10/2022   Procedure: EXPLORATORY LAPAROTOMY;  Surgeon: Signe Mitzie LABOR, MD;  Location: [facility];  Service: General;  Laterality: N/A;   NASAL SINUS  SURGERY     POSTERIOR CERVICAL FUSION/FORAMINOTOMY N/A 06/08/2019   Procedure: Cervical Three to Cervical Seven Posterior cervical decompression and fusion;  Surgeon: Unice Pac, MD;  Location: [facility] OR;  Service: Neurosurgery;  Laterality: N/A;  Cervical Three to Cervical Seven Posterior cervical decompression and fusion   RADIOLOGY WITH ANESTHESIA N/A 04/28/2019   Procedure: MRI RADIOLOGY WITH ANESTHESIA;  Surgeon: Radiologist, Medication, MD;  Location: [facility] OR;  Service: Radiology;  Laterality: N/A;   SKIN LESION EXCISION      TRIGGER FINGER RELEASE     Social History   Socioeconomic History   Marital status: Married    Spouse name: Not on file   Number of children: Not on file   Years of education: Not on file   Highest education level: Not on file  Occupational History   Not on file  Tobacco Use   Smoking status: Never    Passive exposure: Never   Smokeless tobacco: Never  Vaping Use   Vaping status: Never Used  Substance and Sexual Activity   Alcohol use: No   Drug use: No   Sexual activity: Yes  Other Topics Concern   Not on file  Social History Narrative   Not on file   Social Drivers of Health   Financial Resource Strain: Not on file  Food Insecurity: Unknown (07/10/2022)   Hunger Vital Sign    Worried About Running Out of Food in the Last Year: Never true    Ran Out of Food in the Last Year: Patient declined  Transportation Needs: No Transportation Needs (07/10/2022)   PRAPARE - Administrator, Civil Service (Medical): No    Lack of Transportation (Non-Medical): No  Physical Activity: Not on file  Stress: Not on file  Social Connections: Not on file   Outpatient Encounter Medications as of 12/17/2023  Medication Sig   Dulaglutide  (TRULICITY ) 0.75 MG/0.5ML SOAJ Inject 0.75 mg into the skin once a week.   albuterol  (PROAIR  HFA) 108 (90 Base) MCG/ACT inhaler 2 puffs every 4 hours as needed only  if your can't catch your breath   amLODipine  (NORVASC ) 10 MG tablet Take 10 mg by mouth daily.   Calcium  Carb-Cholecalciferol  (CALCIUM  500+D3 PO) Take 1 tablet by mouth 3 (three) times daily.    Cyanocobalamin  (VITAMIN B12) 1000 MCG TBCR Take 1 tablet by mouth daily.   Dupilumab  (DUPIXENT ) 300 MG/2ML SOAJ Inject 300 mg into the skin every 14 (fourteen) days.   escitalopram  (LEXAPRO ) 20 MG tablet Take 20 mg by mouth daily.   famotidine  (PEPCID ) 20 MG tablet One after supper   Fluticasone -Umeclidin-Vilant (TRELEGY ELLIPTA ) 200-62.5-25 MCG/ACT AEPB Inhale 1 puff into the lungs daily.    hydrochlorothiazide  (HYDRODIURIL ) 12.5 MG tablet Take 12.5 mg by mouth daily.   MAGNESIUM  CITRATE PO Take 1 tablet by mouth daily. Chew Gummies: 200 mg   Multiple Vitamin (MULTIVITAMIN WITH MINERALS) TABS tablet Take 1 tablet by mouth daily.   olmesartan  (BENICAR ) 40 MG tablet Take 40 mg by mouth daily.   pantoprazole  (PROTONIX ) 40 MG tablet Take 1 tablet (40 mg total) by mouth daily. Take 30-60 min before first meal of the day   Facility-Administered Encounter Medications as of 12/17/2023  Medication   dupilumab  (DUPIXENT ) prefilled syringe 300 mg   ALLERGIES: Allergies  Allergen Reactions   Gramineae Pollens Cough   Levofloxacin Nausea And Vomiting   Baclofen Anxiety and Other (See Comments)    Alters mental state   VACCINATION STATUS: Immunization History  Administered  Date(s) Administered   INFLUENZA, HIGH DOSE SEASONAL PF 10/19/2017, 10/29/2018   Influenza-Unspecified 10/22/2017   Moderna Covid-19 Fall Seasonal Vaccine 63yrs & older 11/14/2021   Moderna Covid-19 Vaccine Bivalent Booster 31yrs & up 09/28/2020   Moderna Sars-Covid-2 Vaccination 02/14/2019, 09/16/2019, 05/02/2020   Pfizer(Comirnaty)Fall Seasonal Vaccine 12 years and older 10/11/2022, 10/07/2023   Tdap 12/23/2017   Zoster Recombinant(Shingrix) 12/23/2017    Diabetes He presents for his follow-up diabetic visit. He has type 2 diabetes mellitus. Onset time: He was diagnosed at approximate age of 45 years. His disease course has been worsening. There are no hypoglycemic associated symptoms. Pertinent negatives for hypoglycemia include no confusion, headaches, pallor or seizures. Pertinent negatives for diabetes include no chest pain, no polydipsia, no polyphagia, no polyuria and no weakness. There are no hypoglycemic complications. Symptoms are worsening. Diabetic complications include nephropathy. Risk factors for coronary artery disease include diabetes mellitus, dyslipidemia, hypertension and male sex. His weight is  decreasing steadily (He is status post Roux-en-Y gastric bypass-after successfully losing 135 pounds.). He is following a diabetic and generally healthy diet. He has not had a previous visit with a dietitian. He participates in exercise intermittently (He plays golf.). Elnora presents with A1c of 7.7%.   He was previously treated with low-dose Trulicity  as well as low-dose Mounjaro  which he had to stop due to the fact that he was losing his appetite and he healed the target A1c.  He is not symptomatic of hyperglycemia at this time.   He maintained 135 pounds weight loss after his surgery.  He is status post Roux-en-Y surgery.  ) An ACE inhibitor/angiotensin II receptor blocker is being taken. Eye exam is current.  Hyperlipidemia This is a chronic problem. The current episode started more than 1 year ago. The problem is uncontrolled. Exacerbating diseases include chronic renal disease and diabetes. He has no history of obesity. Pertinent negatives include no chest pain, myalgias or shortness of breath. Current antihyperlipidemic treatment includes statins and bile acid squestrants. Compliance problems include adherence to diet.  Risk factors for coronary artery disease include diabetes mellitus, dyslipidemia, hypertension, male sex, obesity and a sedentary lifestyle.  Hypertension This is a chronic problem. The current episode started more than 1 year ago. The problem is uncontrolled. Pertinent negatives include no chest pain, headaches, neck pain, palpitations or shortness of breath. Risk factors for coronary artery disease include diabetes mellitus, dyslipidemia and sedentary lifestyle. Past treatments include angiotensin blockers. Hypertensive end-organ damage includes kidney disease. Identifiable causes of hypertension include chronic renal disease.    Objective:    BP (!) 166/86   Pulse 64   Ht 5' 9 (1.753 m)   Wt 174 lb 12.8 oz (79.3 kg)   BMI 25.81 kg/m   Wt Readings from Last 3 Encounters:   12/17/23 174 lb 12.8 oz (79.3 kg)  09/06/23 170 lb 4 oz (77.2 kg)  08/14/23 170 lb (77.1 kg)     Recent Results (from the past 2160 hours)  Lipid panel     Status: None   Collection Time: 12/10/23  9:56 AM  Result Value Ref Range   Cholesterol, Total 166 100 - 199 mg/dL   Triglycerides 93 0 - 149 mg/dL   HDL 58 >60 mg/dL   VLDL Cholesterol Cal 17 5 - 40 mg/dL   LDL Chol Calc (NIH) 91 0 - 99 mg/dL   Chol/HDL Ratio 2.9 0.0 - 5.0 ratio    Comment:  T. Chol/HDL Ratio                                             Men  Women                               1/2 Avg.Risk  3.4    3.3                                   Avg.Risk  5.0    4.4                                2X Avg.Risk  9.6    7.1                                3X Avg.Risk 23.4   11.0   FIB-4 W/REFLEX TO ELF     Status: None (Preliminary result)   Collection Time: 12/10/23  9:56 AM  Result Value Ref Range   AST 25 0 - 40 IU/L   ALT 21 0 - 44 IU/L   Platelets 206 150 - 450 x10E3/uL   FIB-4 Index 1.93 0.00 - 2.67    Comment:       0.00 - 1.29 Low risk for advanced liver fibrosis       1.30 - 2.67 Indeterminate risk for advanced liver                   fibrosis             >2.67 High risk for advanced fibrosis and                   for the development of other liver                   related events   Enhanced Liver Fibrosis (ELF)     Status: None (Preliminary result)   Collection Time: 12/10/23  9:56 AM  Result Value Ref Range   ELF(TM) Score WILL FOLLOW   HgB A1c     Status: Abnormal   Collection Time: 12/17/23 10:33 AM  Result Value Ref Range   Hemoglobin A1C     HbA1c POC (<> result, manual entry)     HbA1c, POC (prediabetic range)     HbA1c, POC (controlled diabetic range) 7.7 (A) 0.0 - 7.0 %   Lipid Panel     Component Value Date/Time   CHOL 166 12/10/2023 0956   TRIG 93 12/10/2023 0956   HDL 58 12/10/2023 0956   CHOLHDL 2.9 12/10/2023 0956   CHOLHDL 3.9 04/25/2016 1119   VLDL  27 04/25/2016 1119   LDLCALC 91 12/10/2023 0956     Assessment & Plan:   1.  type 2 diabetes mellitus complicated by stage 4 CKD - Patient has currently controlled asymptomatic type 2 DM since  73 years of age.  Patient is status post Roux-en-Y gastric bypass.  Wallie presents with A1c of 7.7%.   He was previously treated with low-dose Trulicity  as well as low-dose Mounjaro  which he had to stop due to the fact that he  was losing his appetite and he healed the target A1c.  He is not symptomatic of hyperglycemia at this time.   He maintained 135 pounds weight loss after his surgery.  He is status post Roux-en-Y surgery.   - His diabetes is complicated by stage 4 CKD  and patient remains at a high risk for more acute and chronic complications of diabetes which include CAD, CVA, ESRD, retinopathy, and neuropathy. These are all discussed in detail with the patient.  - I have counseled the patient on diet management by adopting a carbohydrate restricted/protein rich diet.  - he acknowledges that there is a room for improvement in his food and drink choices. - Suggestion is made for him to avoid simple carbohydrates  from his diet including Cakes, Sweet Desserts, Ice Cream, Soda (diet and regular), Sweet Tea, Candies, Chips, Cookies, Store Bought Juices, Alcohol , Artificial Sweeteners,  Coffee Creamer, and Sugar-free Products, Lemonade. This will help patient to have more stable blood glucose profile and potentially avoid unintended weight gain.  He presents with above target A1c of 7.7%.  To help him achieve and maintain control of diabetes to target, he is approached for a low-dose of GLP-1 receptor agonist and he agrees. - I discussed initiated Trulicity  0.75 mg subcutaneously weekly.  This medication will be advanced if he tolerates and as needed.  - Patient specific target  A1c;  LDL, HDL, Triglycerides,  were discussed in detail.  2) BP/HTN: - His blood pressure is not controlled to  target.  He states that his blood pressure readings are on target at home.  He remains on amlodipine  10 mg p.o. daily, hydrochlorothiazide  12.5 mg p.o. daily, Benicar  40 mg p.o. daily.  He wishes to address his blood pressure management with his nephrologist.    3) Lipids/HPL: Recent lipid panel showed LDL at 91.   He is not on statins, would like to address it with his PMD.     4)  Weight/Diet: Status post Roux-en-Y gastric bypass, successfully lost up to 135 pounds.   His current weight is optimal for him.  He would like to maintain with no further suppression of his appetite and this was deemed appropriate and reasonable.  5) Chronic Care/Health Maintenance:  -Patient is on ACEI/ARB and Statin medications and encouraged to continue to follow up with Ophthalmology, nephrology, podiatrist at least yearly or according to recommendations, and advised to   stay away from smoking. I have recommended yearly flu vaccine and pneumonia vaccination at least every 5 years; moderate intensity exercise for up to 150 minutes weekly; and  sleep for at least 7 hours a day. He is advised to continue vitamin B12, calcium  and vitamin D  supplements.  -This patient will benefit from screening bone density.  I discussed and ordered this test for him to obtain at density imaging facility.   His screening ABI was negative for PAD in March 2022.    - I advised patient to maintain close follow up with Vasireddy, Edie Bi, MD for primary care needs.   I spent  25  minutes in the care of the patient today including review of labs from Thyroid  Function, CMP, and other relevant labs ; imaging/biopsy records (current and previous including abstractions from other facilities); face-to-face time discussing  his lab results and symptoms, medications doses, his options of short and long term treatment based on the latest standards of care / guidelines;   and documenting the encounter.  Beryl Ruth Sax  participated  in  the discussions, expressed understanding, and voiced agreement with the above plans.  All questions were answered to his satisfaction. he is encouraged to contact clinic should he have any questions or concerns prior to his return visit.     Follow up plan: In 6 months with labs. Ranny Earl, MD Phone: [phone]  Fax: [phone]  -  This note was partially dictated with voice recognition software. Similar sounding words can be transcribed inadequately or may not  be corrected upon review.  12/17/2023, 10:51 AM

## 2023-12-26 LAB — LIPID PANEL
Chol/HDL Ratio: 2.9 ratio (ref 0.0–5.0)
Cholesterol, Total: 166 mg/dL (ref 100–199)
HDL: 58 mg/dL (ref >39)
LDL Chol Calc (NIH): 91 mg/dL (ref 0–99)
Triglycerides: 93 mg/dL (ref 0–149)
VLDL Cholesterol Cal: 17 mg/dL (ref 5–40)

## 2023-12-26 LAB — FIB-4 W/REFLEX TO ELF
ALT: 21 IU/L (ref 0–44)
AST: 25 IU/L (ref 0–40)
FIB-4 Index: 1.93 (ref 0.00–2.67)
Platelets: 206 x10E3/uL (ref 150–450)

## 2023-12-26 LAB — ENHANCED LIVER FIBROSIS (ELF)

## 2024-02-04 ENCOUNTER — Other Ambulatory Visit: Payer: Self-pay | Admitting: Allergy & Immunology

## 2024-02-05 ENCOUNTER — Other Ambulatory Visit (HOSPITAL_COMMUNITY): Payer: Self-pay

## 2024-03-13 ENCOUNTER — Ambulatory Visit: Admitting: Allergy & Immunology

## 2024-06-16 ENCOUNTER — Ambulatory Visit: Admitting: "Endocrinology
